# Patient Record
Sex: Female | Born: 1960
Health system: Southern US, Community
[De-identification: ages and names within clinical notes are randomized; demographics above are authoritative.]

## PROBLEM LIST (undated history)

## (undated) DIAGNOSIS — E119 Type 2 diabetes mellitus without complications: Secondary | ICD-10-CM

## (undated) DIAGNOSIS — F32A Depression, unspecified: Secondary | ICD-10-CM

## (undated) DIAGNOSIS — J189 Pneumonia, unspecified organism: Secondary | ICD-10-CM

## (undated) DIAGNOSIS — R918 Other nonspecific abnormal finding of lung field: Secondary | ICD-10-CM

## (undated) DIAGNOSIS — K219 Gastro-esophageal reflux disease without esophagitis: Secondary | ICD-10-CM

## (undated) DIAGNOSIS — R51 Headache: Secondary | ICD-10-CM

## (undated) DIAGNOSIS — N393 Stress incontinence (female) (male): Secondary | ICD-10-CM

## (undated) DIAGNOSIS — T4145XA Adverse effect of unspecified anesthetic, initial encounter: Secondary | ICD-10-CM

## (undated) DIAGNOSIS — K529 Noninfective gastroenteritis and colitis, unspecified: Secondary | ICD-10-CM

## (undated) DIAGNOSIS — F419 Anxiety disorder, unspecified: Secondary | ICD-10-CM

## (undated) DIAGNOSIS — C801 Malignant (primary) neoplasm, unspecified: Secondary | ICD-10-CM

## (undated) DIAGNOSIS — Z9889 Other specified postprocedural states: Secondary | ICD-10-CM

## (undated) DIAGNOSIS — R112 Nausea with vomiting, unspecified: Secondary | ICD-10-CM

## (undated) DIAGNOSIS — T8859XA Other complications of anesthesia, initial encounter: Secondary | ICD-10-CM

## (undated) DIAGNOSIS — F329 Major depressive disorder, single episode, unspecified: Secondary | ICD-10-CM

## (undated) DIAGNOSIS — K589 Irritable bowel syndrome without diarrhea: Secondary | ICD-10-CM

## (undated) DIAGNOSIS — L309 Dermatitis, unspecified: Secondary | ICD-10-CM

## (undated) DIAGNOSIS — K635 Polyp of colon: Secondary | ICD-10-CM

## (undated) DIAGNOSIS — J40 Bronchitis, not specified as acute or chronic: Secondary | ICD-10-CM

## (undated) DIAGNOSIS — R0602 Shortness of breath: Secondary | ICD-10-CM

## (undated) HISTORY — DX: Major depressive disorder, single episode, unspecified: F32.9

## (undated) HISTORY — DX: Depression, unspecified: F32.A

## (undated) HISTORY — PX: BIOPSY THYROID: PRO38

## (undated) HISTORY — DX: Type 2 diabetes mellitus without complications: E11.9

## (undated) HISTORY — PX: UTERINE FIBROID SURGERY: SHX826

---

## 1991-08-28 HISTORY — PX: OTHER SURGICAL HISTORY: SHX169

## 1998-07-11 ENCOUNTER — Other Ambulatory Visit: Admission: RE | Admit: 1998-07-11 | Discharge: 1998-07-11 | Payer: Self-pay | Admitting: *Deleted

## 1998-07-21 ENCOUNTER — Inpatient Hospital Stay (HOSPITAL_COMMUNITY): Admission: AD | Admit: 1998-07-21 | Discharge: 1998-07-21 | Payer: Self-pay | Admitting: Obstetrics & Gynecology

## 1998-07-21 ENCOUNTER — Encounter: Payer: Self-pay | Admitting: Obstetrics & Gynecology

## 1998-07-27 ENCOUNTER — Other Ambulatory Visit: Admission: RE | Admit: 1998-07-27 | Discharge: 1998-07-27 | Payer: Self-pay | Admitting: Obstetrics and Gynecology

## 1998-11-03 ENCOUNTER — Ambulatory Visit (HOSPITAL_COMMUNITY): Admission: RE | Admit: 1998-11-03 | Discharge: 1998-11-03 | Payer: Self-pay | Admitting: Obstetrics and Gynecology

## 1999-02-01 ENCOUNTER — Other Ambulatory Visit: Admission: RE | Admit: 1999-02-01 | Discharge: 1999-02-01 | Payer: Self-pay | Admitting: Obstetrics and Gynecology

## 1999-03-30 ENCOUNTER — Inpatient Hospital Stay (HOSPITAL_COMMUNITY): Admission: AD | Admit: 1999-03-30 | Discharge: 1999-03-30 | Payer: Self-pay | Admitting: Obstetrics and Gynecology

## 1999-03-30 ENCOUNTER — Encounter: Payer: Self-pay | Admitting: Obstetrics & Gynecology

## 1999-04-27 ENCOUNTER — Encounter: Admission: RE | Admit: 1999-04-27 | Discharge: 1999-07-26 | Payer: Self-pay | Admitting: Obstetrics and Gynecology

## 1999-08-04 ENCOUNTER — Inpatient Hospital Stay (HOSPITAL_COMMUNITY): Admission: AD | Admit: 1999-08-04 | Discharge: 1999-08-04 | Payer: Self-pay | Admitting: Obstetrics & Gynecology

## 1999-08-22 ENCOUNTER — Ambulatory Visit (HOSPITAL_COMMUNITY): Admission: RE | Admit: 1999-08-22 | Discharge: 1999-08-22 | Payer: Self-pay | Admitting: Obstetrics and Gynecology

## 1999-08-23 ENCOUNTER — Inpatient Hospital Stay (HOSPITAL_COMMUNITY): Admission: AD | Admit: 1999-08-23 | Discharge: 1999-08-27 | Payer: Self-pay | Admitting: Obstetrics and Gynecology

## 1999-08-28 ENCOUNTER — Encounter: Admission: RE | Admit: 1999-08-28 | Discharge: 1999-10-31 | Payer: Self-pay | Admitting: Obstetrics and Gynecology

## 1999-09-28 ENCOUNTER — Other Ambulatory Visit: Admission: RE | Admit: 1999-09-28 | Discharge: 1999-09-28 | Payer: Self-pay | Admitting: Obstetrics and Gynecology

## 2000-07-10 ENCOUNTER — Ambulatory Visit (HOSPITAL_COMMUNITY): Admission: RE | Admit: 2000-07-10 | Discharge: 2000-07-10 | Payer: Self-pay | Admitting: Obstetrics and Gynecology

## 2000-07-10 ENCOUNTER — Encounter (INDEPENDENT_AMBULATORY_CARE_PROVIDER_SITE_OTHER): Payer: Self-pay | Admitting: Specialist

## 2000-11-26 ENCOUNTER — Other Ambulatory Visit: Admission: RE | Admit: 2000-11-26 | Discharge: 2000-11-26 | Payer: Self-pay | Admitting: Obstetrics and Gynecology

## 2000-12-18 ENCOUNTER — Ambulatory Visit (HOSPITAL_COMMUNITY): Admission: RE | Admit: 2000-12-18 | Discharge: 2000-12-18 | Payer: Self-pay | Admitting: Gastroenterology

## 2000-12-18 ENCOUNTER — Encounter (INDEPENDENT_AMBULATORY_CARE_PROVIDER_SITE_OTHER): Payer: Self-pay | Admitting: Specialist

## 2001-05-07 ENCOUNTER — Ambulatory Visit (HOSPITAL_COMMUNITY): Admission: RE | Admit: 2001-05-07 | Discharge: 2001-05-07 | Payer: Self-pay | Admitting: Internal Medicine

## 2001-05-16 ENCOUNTER — Ambulatory Visit (HOSPITAL_BASED_OUTPATIENT_CLINIC_OR_DEPARTMENT_OTHER): Admission: RE | Admit: 2001-05-16 | Discharge: 2001-05-16 | Payer: Self-pay | Admitting: Internal Medicine

## 2001-08-25 ENCOUNTER — Encounter (INDEPENDENT_AMBULATORY_CARE_PROVIDER_SITE_OTHER): Payer: Self-pay | Admitting: *Deleted

## 2001-08-25 ENCOUNTER — Ambulatory Visit (HOSPITAL_COMMUNITY): Admission: RE | Admit: 2001-08-25 | Discharge: 2001-08-25 | Payer: Self-pay | Admitting: *Deleted

## 2001-12-02 ENCOUNTER — Other Ambulatory Visit: Admission: RE | Admit: 2001-12-02 | Discharge: 2001-12-02 | Payer: Self-pay | Admitting: Obstetrics and Gynecology

## 2002-10-22 ENCOUNTER — Other Ambulatory Visit: Admission: RE | Admit: 2002-10-22 | Discharge: 2002-10-22 | Payer: Self-pay | Admitting: Obstetrics and Gynecology

## 2003-03-19 ENCOUNTER — Encounter: Payer: Self-pay | Admitting: Emergency Medicine

## 2003-03-19 ENCOUNTER — Encounter: Payer: Self-pay | Admitting: *Deleted

## 2003-03-19 ENCOUNTER — Emergency Department (HOSPITAL_COMMUNITY): Admission: EM | Admit: 2003-03-19 | Discharge: 2003-03-19 | Payer: Self-pay | Admitting: Emergency Medicine

## 2003-07-13 ENCOUNTER — Encounter: Admission: RE | Admit: 2003-07-13 | Discharge: 2003-07-13 | Payer: Self-pay | Admitting: Gastroenterology

## 2003-10-28 ENCOUNTER — Other Ambulatory Visit: Admission: RE | Admit: 2003-10-28 | Discharge: 2003-10-28 | Payer: Self-pay | Admitting: Obstetrics and Gynecology

## 2004-06-07 ENCOUNTER — Ambulatory Visit: Payer: Self-pay | Admitting: Family Medicine

## 2004-06-09 ENCOUNTER — Encounter: Admission: RE | Admit: 2004-06-09 | Discharge: 2004-06-09 | Payer: Self-pay | Admitting: Family Medicine

## 2004-07-26 ENCOUNTER — Ambulatory Visit: Payer: Self-pay | Admitting: Internal Medicine

## 2004-08-25 ENCOUNTER — Ambulatory Visit: Payer: Self-pay | Admitting: Family Medicine

## 2004-11-10 ENCOUNTER — Other Ambulatory Visit: Admission: RE | Admit: 2004-11-10 | Discharge: 2004-11-10 | Payer: Self-pay | Admitting: Obstetrics and Gynecology

## 2004-12-15 ENCOUNTER — Ambulatory Visit: Payer: Self-pay | Admitting: Family Medicine

## 2005-01-19 ENCOUNTER — Ambulatory Visit: Payer: Self-pay | Admitting: Internal Medicine

## 2005-02-02 ENCOUNTER — Encounter: Admission: RE | Admit: 2005-02-02 | Discharge: 2005-05-03 | Payer: Self-pay | Admitting: Internal Medicine

## 2005-03-25 ENCOUNTER — Emergency Department (HOSPITAL_COMMUNITY): Admission: EM | Admit: 2005-03-25 | Discharge: 2005-03-25 | Payer: Self-pay | Admitting: Family Medicine

## 2005-03-27 ENCOUNTER — Ambulatory Visit: Payer: Self-pay | Admitting: Family Medicine

## 2005-06-04 ENCOUNTER — Ambulatory Visit: Payer: Self-pay | Admitting: Family Medicine

## 2005-06-12 ENCOUNTER — Ambulatory Visit: Payer: Self-pay

## 2005-11-02 ENCOUNTER — Ambulatory Visit: Payer: Self-pay | Admitting: Family Medicine

## 2005-11-15 ENCOUNTER — Other Ambulatory Visit: Admission: RE | Admit: 2005-11-15 | Discharge: 2005-11-15 | Payer: Self-pay | Admitting: Obstetrics and Gynecology

## 2005-12-19 ENCOUNTER — Ambulatory Visit: Payer: Self-pay | Admitting: Internal Medicine

## 2005-12-20 ENCOUNTER — Ambulatory Visit: Payer: Self-pay | Admitting: Internal Medicine

## 2005-12-24 ENCOUNTER — Ambulatory Visit: Payer: Self-pay

## 2005-12-28 ENCOUNTER — Ambulatory Visit: Payer: Self-pay | Admitting: Family Medicine

## 2006-01-04 ENCOUNTER — Ambulatory Visit: Payer: Self-pay | Admitting: Gastroenterology

## 2006-01-31 ENCOUNTER — Ambulatory Visit: Payer: Self-pay | Admitting: Family Medicine

## 2006-03-26 ENCOUNTER — Ambulatory Visit: Payer: Self-pay | Admitting: Family Medicine

## 2006-05-29 ENCOUNTER — Ambulatory Visit: Payer: Self-pay | Admitting: Family Medicine

## 2006-05-31 ENCOUNTER — Ambulatory Visit: Payer: Self-pay | Admitting: Family Medicine

## 2006-06-05 ENCOUNTER — Ambulatory Visit: Payer: Self-pay | Admitting: Cardiology

## 2006-06-14 ENCOUNTER — Ambulatory Visit (HOSPITAL_COMMUNITY): Admission: RE | Admit: 2006-06-14 | Discharge: 2006-06-14 | Payer: Self-pay | Admitting: Gastroenterology

## 2006-06-21 ENCOUNTER — Encounter: Admission: RE | Admit: 2006-06-21 | Discharge: 2006-06-21 | Payer: Self-pay | Admitting: Family Medicine

## 2006-07-12 ENCOUNTER — Encounter: Admission: RE | Admit: 2006-07-12 | Discharge: 2006-07-12 | Payer: Self-pay | Admitting: Obstetrics and Gynecology

## 2006-09-13 ENCOUNTER — Ambulatory Visit: Payer: Self-pay | Admitting: Family Medicine

## 2006-09-13 LAB — CONVERTED CEMR LAB
ALT: 36 units/L — ABNORMAL HIGH (ref 0–35)
AST: 28 units/L (ref 0–37)
Albumin: 4.6 g/dL (ref 3.5–5.2)
Alkaline Phosphatase: 93 units/L (ref 39–117)
Calcium: 9.6 mg/dL (ref 8.4–10.5)
Chloride: 101 meq/L (ref 96–112)
Potassium: 4 meq/L (ref 3.5–5.3)
Sodium: 140 meq/L (ref 135–145)
Total Protein: 7.5 g/dL (ref 6.0–8.3)

## 2006-10-30 ENCOUNTER — Ambulatory Visit: Payer: Self-pay | Admitting: *Deleted

## 2007-01-01 ENCOUNTER — Encounter: Payer: Self-pay | Admitting: Family Medicine

## 2007-01-02 ENCOUNTER — Ambulatory Visit: Payer: Self-pay | Admitting: Cardiology

## 2007-01-06 DIAGNOSIS — G43519 Persistent migraine aura without cerebral infarction, intractable, without status migrainosus: Secondary | ICD-10-CM

## 2007-01-06 DIAGNOSIS — K7689 Other specified diseases of liver: Secondary | ICD-10-CM

## 2007-01-06 DIAGNOSIS — G253 Myoclonus: Secondary | ICD-10-CM

## 2007-01-06 DIAGNOSIS — G43019 Migraine without aura, intractable, without status migrainosus: Secondary | ICD-10-CM | POA: Insufficient documentation

## 2007-01-06 DIAGNOSIS — O99810 Abnormal glucose complicating pregnancy: Secondary | ICD-10-CM

## 2007-01-06 DIAGNOSIS — Z87891 Personal history of nicotine dependence: Secondary | ICD-10-CM

## 2007-01-06 DIAGNOSIS — E1142 Type 2 diabetes mellitus with diabetic polyneuropathy: Secondary | ICD-10-CM

## 2007-01-06 DIAGNOSIS — E1165 Type 2 diabetes mellitus with hyperglycemia: Secondary | ICD-10-CM

## 2007-01-06 HISTORY — DX: Type 2 diabetes mellitus with diabetic polyneuropathy: E11.42

## 2007-01-06 HISTORY — DX: Persistent migraine aura without cerebral infarction, intractable, without status migrainosus: G43.519

## 2007-01-06 HISTORY — DX: Abnormal glucose complicating pregnancy: O99.810

## 2007-01-06 HISTORY — DX: Myoclonus: G25.3

## 2007-01-06 HISTORY — DX: Other specified diseases of liver: K76.89

## 2007-01-06 HISTORY — DX: Personal history of nicotine dependence: Z87.891

## 2007-01-07 ENCOUNTER — Ambulatory Visit: Payer: Self-pay

## 2007-04-11 ENCOUNTER — Telehealth (INDEPENDENT_AMBULATORY_CARE_PROVIDER_SITE_OTHER): Payer: Self-pay | Admitting: *Deleted

## 2007-06-20 ENCOUNTER — Ambulatory Visit: Payer: Self-pay | Admitting: Cardiovascular Disease

## 2007-06-27 ENCOUNTER — Telehealth (INDEPENDENT_AMBULATORY_CARE_PROVIDER_SITE_OTHER): Payer: Self-pay | Admitting: *Deleted

## 2007-07-04 ENCOUNTER — Ambulatory Visit: Payer: Self-pay | Admitting: Family Medicine

## 2007-07-04 DIAGNOSIS — F418 Other specified anxiety disorders: Secondary | ICD-10-CM

## 2007-07-04 HISTORY — DX: Other specified anxiety disorders: F41.8

## 2007-07-10 ENCOUNTER — Telehealth: Payer: Self-pay | Admitting: Family Medicine

## 2007-09-24 ENCOUNTER — Telehealth: Payer: Self-pay | Admitting: Family Medicine

## 2007-11-03 ENCOUNTER — Telehealth (INDEPENDENT_AMBULATORY_CARE_PROVIDER_SITE_OTHER): Payer: Self-pay | Admitting: *Deleted

## 2007-11-14 ENCOUNTER — Ambulatory Visit: Payer: Self-pay | Admitting: Family Medicine

## 2007-11-14 DIAGNOSIS — M25559 Pain in unspecified hip: Secondary | ICD-10-CM

## 2007-11-14 DIAGNOSIS — R5381 Other malaise: Secondary | ICD-10-CM

## 2007-11-14 DIAGNOSIS — IMO0001 Reserved for inherently not codable concepts without codable children: Secondary | ICD-10-CM

## 2007-11-14 DIAGNOSIS — I8390 Asymptomatic varicose veins of unspecified lower extremity: Secondary | ICD-10-CM | POA: Insufficient documentation

## 2007-11-14 DIAGNOSIS — R5383 Other fatigue: Secondary | ICD-10-CM

## 2007-11-14 DIAGNOSIS — M791 Myalgia, unspecified site: Secondary | ICD-10-CM

## 2007-11-14 HISTORY — DX: Pain in unspecified hip: M25.559

## 2007-11-14 HISTORY — DX: Myalgia, unspecified site: M79.10

## 2007-11-14 HISTORY — DX: Other fatigue: R53.83

## 2007-11-14 HISTORY — DX: Asymptomatic varicose veins of unspecified lower extremity: I83.90

## 2007-11-17 ENCOUNTER — Telehealth: Payer: Self-pay | Admitting: Family Medicine

## 2007-11-20 LAB — CONVERTED CEMR LAB
Anti Nuclear Antibody(ANA): NEGATIVE
Rhuematoid fact SerPl-aCnc: 25 intl units/mL — ABNORMAL HIGH (ref 0–20)

## 2007-11-21 ENCOUNTER — Telehealth (INDEPENDENT_AMBULATORY_CARE_PROVIDER_SITE_OTHER): Payer: Self-pay | Admitting: *Deleted

## 2007-11-23 LAB — CONVERTED CEMR LAB
Basophils Relative: 1.4 % — ABNORMAL HIGH (ref 0.0–1.0)
Chloride: 101 meq/L (ref 96–112)
Creatinine, Ser: 0.6 mg/dL (ref 0.4–1.2)
Eosinophils Absolute: 0.1 10*3/uL (ref 0.0–0.6)
Folate: 9.6 ng/mL
GFR calc non Af Amer: 114 mL/min
Glucose, Bld: 89 mg/dL (ref 70–99)
Hemoglobin: 12.7 g/dL (ref 12.0–15.0)
Lymphocytes Relative: 33.3 % (ref 12.0–46.0)
MCV: 85.1 fL (ref 78.0–100.0)
Monocytes Absolute: 0.2 10*3/uL (ref 0.2–0.7)
Monocytes Relative: 6.3 % (ref 3.0–11.0)
Neutro Abs: 2.2 10*3/uL (ref 1.4–7.7)
Platelets: 154 10*3/uL (ref 150–400)
Potassium: 3.6 meq/L (ref 3.5–5.1)
Sed Rate: 32 mm/hr — ABNORMAL HIGH (ref 0–25)
Sodium: 139 meq/L (ref 135–145)
Vitamin B-12: 568 pg/mL (ref 211–911)

## 2007-11-24 ENCOUNTER — Telehealth (INDEPENDENT_AMBULATORY_CARE_PROVIDER_SITE_OTHER): Payer: Self-pay | Admitting: *Deleted

## 2007-11-24 ENCOUNTER — Encounter (INDEPENDENT_AMBULATORY_CARE_PROVIDER_SITE_OTHER): Payer: Self-pay | Admitting: *Deleted

## 2007-11-25 ENCOUNTER — Telehealth (INDEPENDENT_AMBULATORY_CARE_PROVIDER_SITE_OTHER): Payer: Self-pay | Admitting: *Deleted

## 2008-03-05 ENCOUNTER — Ambulatory Visit: Payer: Self-pay | Admitting: Family Medicine

## 2008-03-09 ENCOUNTER — Encounter (INDEPENDENT_AMBULATORY_CARE_PROVIDER_SITE_OTHER): Payer: Self-pay | Admitting: *Deleted

## 2008-03-09 LAB — CONVERTED CEMR LAB: Anti Nuclear Antibody(ANA): NEGATIVE

## 2008-03-10 ENCOUNTER — Telehealth (INDEPENDENT_AMBULATORY_CARE_PROVIDER_SITE_OTHER): Payer: Self-pay | Admitting: *Deleted

## 2008-03-15 ENCOUNTER — Encounter: Payer: Self-pay | Admitting: Family Medicine

## 2008-03-15 ENCOUNTER — Encounter: Admission: RE | Admit: 2008-03-15 | Discharge: 2008-05-18 | Payer: Self-pay | Admitting: Family Medicine

## 2008-03-19 ENCOUNTER — Encounter (INDEPENDENT_AMBULATORY_CARE_PROVIDER_SITE_OTHER): Payer: Self-pay | Admitting: *Deleted

## 2008-03-19 LAB — CONVERTED CEMR LAB
BUN: 15 mg/dL (ref 6–23)
Basophils Relative: 0.3 % (ref 0.0–1.0)
CO2: 30 meq/L (ref 19–32)
Chloride: 104 meq/L (ref 96–112)
Creatinine, Ser: 0.7 mg/dL (ref 0.4–1.2)
Eosinophils Relative: 3.9 % (ref 0.0–5.0)
GFR calc non Af Amer: 95 mL/min
Glucose, Bld: 166 mg/dL — ABNORMAL HIGH (ref 70–99)
HCT: 37.8 % (ref 36.0–46.0)
Hemoglobin: 13.1 g/dL (ref 12.0–15.0)
Lymphocytes Relative: 38.2 % (ref 12.0–46.0)
Monocytes Absolute: 0.3 10*3/uL (ref 0.1–1.0)
Monocytes Relative: 7 % (ref 3.0–12.0)
Neutro Abs: 1.8 10*3/uL (ref 1.4–7.7)
Potassium: 3.9 meq/L (ref 3.5–5.1)
RBC: 4.44 M/uL (ref 3.87–5.11)
Rhuematoid fact SerPl-aCnc: <20 intl units/mL — ABNORMAL LOW (ref 0.0–20.0)
T3, Free: 3.3 pg/mL (ref 2.3–4.2)
TSH: 0.56 microintl units/mL (ref 0.35–5.50)

## 2008-04-29 ENCOUNTER — Ambulatory Visit: Payer: Self-pay | Admitting: Family Medicine

## 2008-04-29 DIAGNOSIS — J209 Acute bronchitis, unspecified: Secondary | ICD-10-CM

## 2008-04-29 HISTORY — DX: Acute bronchitis, unspecified: J20.9

## 2008-05-05 ENCOUNTER — Telehealth (INDEPENDENT_AMBULATORY_CARE_PROVIDER_SITE_OTHER): Payer: Self-pay | Admitting: *Deleted

## 2008-05-05 DIAGNOSIS — B373 Candidiasis of vulva and vagina: Secondary | ICD-10-CM

## 2008-05-05 DIAGNOSIS — B3731 Acute candidiasis of vulva and vagina: Secondary | ICD-10-CM

## 2008-05-05 HISTORY — DX: Acute candidiasis of vulva and vagina: B37.31

## 2008-05-05 HISTORY — DX: Candidiasis of vulva and vagina: B37.3

## 2008-06-14 ENCOUNTER — Encounter: Payer: Self-pay | Admitting: Family Medicine

## 2008-06-24 ENCOUNTER — Encounter: Payer: Self-pay | Admitting: Family Medicine

## 2008-06-28 ENCOUNTER — Encounter: Payer: Self-pay | Admitting: Family Medicine

## 2008-06-29 ENCOUNTER — Ambulatory Visit: Payer: Self-pay | Admitting: Family Medicine

## 2008-06-29 ENCOUNTER — Telehealth (INDEPENDENT_AMBULATORY_CARE_PROVIDER_SITE_OTHER): Payer: Self-pay | Admitting: *Deleted

## 2008-06-29 DIAGNOSIS — L255 Unspecified contact dermatitis due to plants, except food: Secondary | ICD-10-CM | POA: Insufficient documentation

## 2008-06-29 DIAGNOSIS — I10 Essential (primary) hypertension: Secondary | ICD-10-CM | POA: Insufficient documentation

## 2008-06-29 HISTORY — DX: Unspecified contact dermatitis due to plants, except food: L25.5

## 2008-07-07 ENCOUNTER — Telehealth (INDEPENDENT_AMBULATORY_CARE_PROVIDER_SITE_OTHER): Payer: Self-pay | Admitting: *Deleted

## 2008-10-15 ENCOUNTER — Telehealth (INDEPENDENT_AMBULATORY_CARE_PROVIDER_SITE_OTHER): Payer: Self-pay | Admitting: *Deleted

## 2008-10-19 ENCOUNTER — Telehealth (INDEPENDENT_AMBULATORY_CARE_PROVIDER_SITE_OTHER): Payer: Self-pay | Admitting: *Deleted

## 2008-11-17 ENCOUNTER — Encounter: Admission: RE | Admit: 2008-11-17 | Discharge: 2008-11-17 | Payer: Self-pay | Admitting: Internal Medicine

## 2008-11-17 ENCOUNTER — Encounter: Admission: RE | Admit: 2008-11-17 | Discharge: 2008-11-17 | Payer: Self-pay | Admitting: Gastroenterology

## 2008-11-24 ENCOUNTER — Encounter: Admission: RE | Admit: 2008-11-24 | Discharge: 2008-11-24 | Payer: Self-pay | Admitting: Gastroenterology

## 2008-12-24 ENCOUNTER — Encounter: Payer: Self-pay | Admitting: Family Medicine

## 2009-01-12 ENCOUNTER — Ambulatory Visit: Payer: Self-pay | Admitting: Family Medicine

## 2009-01-13 ENCOUNTER — Telehealth (INDEPENDENT_AMBULATORY_CARE_PROVIDER_SITE_OTHER): Payer: Self-pay | Admitting: *Deleted

## 2009-03-02 ENCOUNTER — Telehealth (INDEPENDENT_AMBULATORY_CARE_PROVIDER_SITE_OTHER): Payer: Self-pay | Admitting: *Deleted

## 2009-04-08 ENCOUNTER — Encounter: Admission: RE | Admit: 2009-04-08 | Discharge: 2009-04-08 | Payer: Self-pay | Admitting: Gastroenterology

## 2009-04-14 ENCOUNTER — Telehealth (INDEPENDENT_AMBULATORY_CARE_PROVIDER_SITE_OTHER): Payer: Self-pay | Admitting: *Deleted

## 2009-04-18 ENCOUNTER — Encounter: Payer: Self-pay | Admitting: Family Medicine

## 2009-05-23 ENCOUNTER — Ambulatory Visit: Payer: Self-pay | Admitting: Family Medicine

## 2009-05-23 ENCOUNTER — Telehealth (INDEPENDENT_AMBULATORY_CARE_PROVIDER_SITE_OTHER): Payer: Self-pay | Admitting: *Deleted

## 2009-05-23 ENCOUNTER — Telehealth: Payer: Self-pay | Admitting: Family Medicine

## 2009-05-23 DIAGNOSIS — J019 Acute sinusitis, unspecified: Secondary | ICD-10-CM

## 2009-05-23 HISTORY — DX: Acute sinusitis, unspecified: J01.90

## 2009-06-07 ENCOUNTER — Telehealth (INDEPENDENT_AMBULATORY_CARE_PROVIDER_SITE_OTHER): Payer: Self-pay | Admitting: *Deleted

## 2009-06-08 ENCOUNTER — Telehealth (INDEPENDENT_AMBULATORY_CARE_PROVIDER_SITE_OTHER): Payer: Self-pay | Admitting: *Deleted

## 2009-07-13 ENCOUNTER — Telehealth: Payer: Self-pay | Admitting: Family Medicine

## 2009-07-15 ENCOUNTER — Encounter: Admission: RE | Admit: 2009-07-15 | Discharge: 2009-07-15 | Payer: Self-pay | Admitting: Orthopedic Surgery

## 2009-07-29 ENCOUNTER — Encounter: Payer: Self-pay | Admitting: Family Medicine

## 2009-08-09 ENCOUNTER — Encounter: Admission: RE | Admit: 2009-08-09 | Discharge: 2009-08-25 | Payer: Self-pay | Admitting: Orthopedic Surgery

## 2009-08-25 ENCOUNTER — Encounter: Admission: RE | Admit: 2009-08-25 | Discharge: 2009-09-27 | Payer: Self-pay | Admitting: Orthopedic Surgery

## 2009-09-06 ENCOUNTER — Encounter: Payer: Self-pay | Admitting: Family Medicine

## 2009-09-08 ENCOUNTER — Ambulatory Visit: Payer: Self-pay | Admitting: Family Medicine

## 2009-09-08 DIAGNOSIS — R05 Cough: Secondary | ICD-10-CM

## 2009-09-08 DIAGNOSIS — J9801 Acute bronchospasm: Secondary | ICD-10-CM

## 2009-09-08 DIAGNOSIS — R059 Cough, unspecified: Secondary | ICD-10-CM

## 2009-09-08 HISTORY — DX: Cough, unspecified: R05.9

## 2009-09-08 HISTORY — DX: Acute bronchospasm: J98.01

## 2009-09-09 ENCOUNTER — Encounter (INDEPENDENT_AMBULATORY_CARE_PROVIDER_SITE_OTHER): Payer: Self-pay | Admitting: *Deleted

## 2009-09-16 ENCOUNTER — Encounter: Admission: RE | Admit: 2009-09-16 | Discharge: 2009-09-16 | Payer: Self-pay | Admitting: Obstetrics and Gynecology

## 2009-09-19 ENCOUNTER — Telehealth (INDEPENDENT_AMBULATORY_CARE_PROVIDER_SITE_OTHER): Payer: Self-pay | Admitting: *Deleted

## 2009-09-19 ENCOUNTER — Encounter: Payer: Self-pay | Admitting: Family Medicine

## 2009-09-20 ENCOUNTER — Ambulatory Visit: Payer: Self-pay | Admitting: Family Medicine

## 2009-09-23 ENCOUNTER — Ambulatory Visit: Payer: Self-pay | Admitting: Family Medicine

## 2009-09-23 ENCOUNTER — Ambulatory Visit: Payer: Self-pay | Admitting: Internal Medicine

## 2009-09-27 ENCOUNTER — Encounter: Payer: Self-pay | Admitting: Family Medicine

## 2009-11-07 ENCOUNTER — Encounter: Admission: RE | Admit: 2009-11-07 | Discharge: 2009-11-07 | Payer: Self-pay

## 2009-12-29 ENCOUNTER — Encounter: Payer: Self-pay | Admitting: Family Medicine

## 2010-01-06 ENCOUNTER — Encounter: Admission: RE | Admit: 2010-01-06 | Discharge: 2010-01-06 | Payer: Self-pay | Admitting: Gastroenterology

## 2010-06-02 ENCOUNTER — Telehealth (INDEPENDENT_AMBULATORY_CARE_PROVIDER_SITE_OTHER): Payer: Self-pay | Admitting: *Deleted

## 2010-09-28 NOTE — Letter (Signed)
Summary: Medical Exemption Form/[facility]  Medical Exemption Form/[place] [facility]   Imported By: Edmonia James 09/13/2009 09:22:11  _____________________________________________________________________  External Attachment:    Type:   Image     Comment:   External Document

## 2010-09-28 NOTE — Assessment & Plan Note (Signed)
Summary: cough/kdc   Vital Signs:  Patient profile:   50 year old female Weight:      269 pounds Temp:     98.2 degrees F oral Pulse rate:   82 / minute Pulse rhythm:   regular BP sitting:   126 / 82  (left arm) Cuff size:   large  Vitals Entered By: Allyn Kenner CMA (September 08, 2009 2:57 PM) CC: Pt c/o cough x 1 month. At night she tends to loose her voice. , Cough   History of Present Illness:  Cough      This is a 50 year old woman who presents with Cough.  The symptoms began 1 month ago.  The patient reports productive cough and wheezing, but denies non-productive cough, pleuritic chest pain, shortness of breath, exertional dyspnea, fever, hemoptysis, and malaise.  The patient denies the following symptoms: cold/URI symptoms, sore throat, nasal congestion, chronic rhinitis, weight loss, acid reflux symptoms, and peripheral edema.  The cough is worse with activity.  Ineffective prior treatments have included OTC cough medication.   Pt used her sons neb and felt better after using it.    Current Medications (verified): 1)  Novolog Flexpen 100 Unit/ml Soln (Insulin Aspart) .... Inject 5 Unit Subcutaneously Three Times A Day-Needs Ov and Labs 2)  Onetouch Ultra Test   Strp (Glucose Blood) .... Use As Directed 3)  Novofine 30g X 8 Mm Misc (Insulin Pen Needle) .... Use As Directed 4)  Byetta 5 Mcg Pen 5 Mcg/0.25m  Soln (Exenatide) .... 2 Injections Daily-Needs Labs and Ov 5)  Ativan 0.5 Mg  Tabs (Lorazepam) ..Marland Kitchen. 1 By Mouth Three Times A Day As Needed 6)  Wellbutrin Xl 150 Mg  Tb24 (Bupropion Hcl) ..Marland Kitchen. 1 By Mouth Qam** Brand Name Medically Necessary** 7)  Symlin 600 Mcg/ml Soln (Pramlintide Acetate) .... Three Times Daily 8)  Levemir Flexpen 100 Unit/ml Soln (Insulin Detemir) .... 40-45 Units Daily. 9)  Prodigy No Coding Blood Gluc  Strp (Glucose Blood) .... Test Once Daily 10)  Protonix 40 Mg Tbec (Pantoprazole Sodium) ..Marland Kitchen. 1 By Mouth Once Daily 11)  Betamethasone Dipropionate  0.05 % Crea (Betamethasone Dipropionate) .... Apply Twice A Day If Needed 12)  Nasacort Aq 55 Mcg/act Aers (Triamcinolone Acetonide(Nasal)) .... 2 Sprays Each Nostril Once Daily 13)  Biaxin Xl 500 Mg Xr24h-Tab (Clarithromycin) .... 2 By Mouth Every Day For 14 Days. 14)  Proair Hfa 108 (90 Base) Mcg/act Aers (Albuterol Sulfate) .... 2 Puffs Qid As Needed 15)  Symbicort 80-4.5 Mcg/act Aero (Budesonide-Formoterol Fumarate) .... 2 Puffs Two Times A Day  Allergies: 1)  ! Codeine 2)  ! Prednisone 3)  ! Reglan 4)  Penicillin G Potassium (Penicillin G Potassium)  Past History:  Past medical, surgical, family and social histories (including risk factors) reviewed for relevance to current acute and chronic problems.  Past Medical History: Reviewed history from 06/29/2008 and no changes required. Diabetes mellitus, type II Depression Hypertension  Past Surgical History: Reviewed history from 01/06/2007 and no changes required. Caesarean section  Family History: Reviewed history and no changes required.  Social History: Reviewed history from 03/05/2008 and no changes required. Divorced Alcohol use-no Drug use-no Regular exercise-no  Review of Systems      See HPI  Physical Exam  General:  Well-developed,well-nourished,in no acute distress; alert,appropriate and cooperative throughout examination Ears:  External ear exam shows no significant lesions or deformities.  Otoscopic examination reveals clear canals, tympanic membranes are intact bilaterally without bulging, retraction, inflammation or discharge.  Hearing is grossly normal bilaterally. Nose:  External nasal examination shows no deformity or inflammation. Nasal mucosa are pink and moist without lesions or exudates. Mouth:  Oral mucosa and oropharynx without lesions or exudates.  Teeth in good repair. Neck:  No deformities, masses, or tenderness noted. Lungs:  Normal respiratory effort, chest expands symmetrically. Lungs are  clear to auscultation, no crackles or wheezes. Heart:  Normal rate and regular rhythm. S1 and S2 normal without gallop, murmur, click, rub or other extra sounds. Skin:  Intact without suspicious lesions or rashes Psych:  Oriented X3 and normally interactive.     Impression & Recommendations:  Problem # 1:  COUGH (ICD-786.2)  Orders: T-2 View CXR ([id number]) Pulmonary Referral (Pulmonary)  Problem # 2:  ACUTE BRONCHOSPASM (ICD-519.11) check PFTs symbicort 80---- 2 puffs two times a day  proair  Orders: Pulmonary Referral (Pulmonary)  Complete Medication List: 1)  Novolog Flexpen 100 Unit/ml Soln (Insulin aspart) .... Inject 5 unit subcutaneously three times a day-needs ov and labs 2)  Onetouch Ultra Test Strp (Glucose blood) .... Use as directed 3)  Novofine 30g X 8 Mm Misc (Insulin pen needle) .... Use as directed 4)  Byetta 5 Mcg Pen 5 Mcg/0.43m Soln (Exenatide) .... 2 injections daily-needs labs and ov 5)  Ativan 0.5 Mg Tabs (Lorazepam) ..Marland Kitchen. 1 by mouth three times a day as needed 6)  Wellbutrin Xl 150 Mg Tb24 (Bupropion hcl) ..Marland Kitchen. 1 by mouth qam** brand name medically necessary** 7)  Symlin 600 Mcg/ml Soln (Pramlintide acetate) .... Three times daily 8)  Levemir Flexpen 100 Unit/ml Soln (Insulin detemir) .... 40-45 units daily. 9)  Prodigy No Coding Blood Gluc Strp (Glucose blood) .... Test once daily 10)  Protonix 40 Mg Tbec (Pantoprazole sodium) ..Marland Kitchen. 1 by mouth once daily 11)  Betamethasone Dipropionate 0.05 % Crea (Betamethasone dipropionate) .... Apply twice a day if needed 12)  Nasacort Aq 55 Mcg/act Aers (Triamcinolone acetonide(nasal)) .... 2 sprays each nostril once daily 13)  Biaxin Xl 500 Mg Xr24h-tab (Clarithromycin) .... 2 by mouth every day for 14 days. 14)  Proair Hfa 108 (90 Base) Mcg/act Aers (Albuterol sulfate) .... 2 puffs qid as needed 15)  Symbicort 80-4.5 Mcg/act Aero (Budesonide-formoterol fumarate) .... 2 puffs two times a day 16)  Zyrtec Allergy 10 Mg Tabs  (Cetirizine hcl) ..Marland Kitchen. 1 by mouth once daily Prescriptions: PROAIR HFA 108 (90 BASE) MCG/ACT AERS (ALBUTEROL SULFATE) 2 puffs qid as needed  #1 x 0   Entered and Authorized by:   YGarnet KoyanagiDO   Signed by:   YGarnet KoyanagiDO on 09/08/2009   Method used:   Electronically to        [id number] (retail)       [address] [place]  [id number]      Ph: [phone]      Fax: [phone]  RxID:   [id number]

## 2010-09-28 NOTE — Miscellaneous (Signed)
Summary: Orders Update pft charges  Clinical Lists Changes  Orders: Added new Service order of Carbon Monoxide diffusing w/capacity (94720) - Signed Added new Service order of Lung Volumes (94240) - Signed Added new Service order of Spirometry (Pre & Post) (94060) - Signed 

## 2010-09-28 NOTE — Letter (Signed)
Summary: Medical Exemption Request Form/[place] [facility]  Medical Exemption Request Form/[place] [facility]   Imported By: Edmonia James 09/26/2009 11:48:41  _____________________________________________________________________  External Attachment:    Type:   Image     Comment:   External Document

## 2010-09-28 NOTE — Progress Notes (Signed)
Summary: Prior Auth APPROVED [place] [facility]  Phone Note Refill Request Call back at [phone] Message from:  Pharmacy on June 02, 2010 10:44 AM  Refills Requested: Medication #1:  PROTONIX 40 MG TBEC 1 by mouth once daily   Dosage confirmed as above?Dosage Confirmed   Brand Name Necessary? No Prior Auth: [facility] on [address]  Initial call taken by: Elna Breslow,  June 02, 2010 10:44 AM  Follow-up for Phone Call        prior auth approved 06-06-10 until 06-06-11, pharmacy verbally informed.............Marland KitchenFelecia Deloach CMA  June 06, 2010 9:27 AM

## 2010-09-28 NOTE — Letter (Signed)
Summary: [facility]  [facility]   Imported By: Edmonia James 01/06/2010 10:08:13  _____________________________________________________________________  External Attachment:    Type:   Image     Comment:   External Document

## 2010-09-28 NOTE — Progress Notes (Signed)
Summary: PAPERWORK TO BE COMPLETED TODAY!!!!  Phone Note Call from Patient Call back at Home Phone [phone]   Caller: Patient Summary of Call: PATIENT DROPPED OFF PAPERWORK [facility] --MEDICAL EXEMPTION REQUEST  (FOR HER TO BE MOVED BACK FROM [place] [facility] TO REGULAR MEDICAID)   TOOK BACK TO [facility] [place] [place]  NEEDS TO BE COMPLETED TODAY!!!! Initial call taken by: Berneta Sages,  September 19, 2009 1:08 PM  Follow-up for Phone Call        form placed in enclose envelope and mail, left pt detail message form complete.............Marland KitchenFelecia Deloach CMA  September 20, 2009 8:25 AM

## 2010-09-28 NOTE — Letter (Signed)
Summary: Primary Care Consult Scheduled Letter  [facility] [place]   [place], [place] [address]   Phone: [phone]  Fax: [phone]      09/09/2009 MRN: [id number]  [facility] [address] [place], [place]  [address]    Dear Ms. Wickham,    We have scheduled an appointment for you.  At the recommendation of Dr. Garnet Koyanagi, we have scheduled you at [facility] for Pulmonary Function Test (PFTs) on 09-16-2009 at 12:00pm.  Their address is [address], [address], [place] [address]. The office phone number is [phone].  If this appointment day and time is not convenient for you, please feel free to call the office of the doctor you are being referred to at the number listed above and reschedule the appointment.    It is important for you to keep your scheduled appointments. We are here to make sure you are given good patient care.   Thank you,    Renee, Patient Care Coordinator [place] at [facility]

## 2010-10-12 ENCOUNTER — Other Ambulatory Visit: Payer: Self-pay | Admitting: Internal Medicine

## 2010-10-12 DIAGNOSIS — E049 Nontoxic goiter, unspecified: Secondary | ICD-10-CM

## 2010-10-27 ENCOUNTER — Other Ambulatory Visit: Payer: Self-pay | Admitting: Obstetrics and Gynecology

## 2010-10-27 DIAGNOSIS — Z1231 Encounter for screening mammogram for malignant neoplasm of breast: Secondary | ICD-10-CM

## 2010-11-03 ENCOUNTER — Ambulatory Visit
Admission: RE | Admit: 2010-11-03 | Discharge: 2010-11-03 | Disposition: A | Discharge status: Home / Self Care | Payer: Medicaid Other | Source: Ambulatory Visit | Attending: Obstetrics and Gynecology | Admitting: Obstetrics and Gynecology

## 2010-11-03 DIAGNOSIS — Z1231 Encounter for screening mammogram for malignant neoplasm of breast: Secondary | ICD-10-CM

## 2010-11-08 ENCOUNTER — Other Ambulatory Visit: Payer: Self-pay

## 2010-11-08 ENCOUNTER — Ambulatory Visit
Admission: RE | Admit: 2010-11-08 | Discharge: 2010-11-08 | Disposition: A | Discharge status: Home / Self Care | Payer: Medicaid Other | Source: Ambulatory Visit | Attending: Internal Medicine | Admitting: Internal Medicine

## 2010-11-08 DIAGNOSIS — E049 Nontoxic goiter, unspecified: Secondary | ICD-10-CM

## 2010-11-16 ENCOUNTER — Other Ambulatory Visit: Payer: Self-pay | Admitting: Family Medicine

## 2010-12-20 ENCOUNTER — Other Ambulatory Visit: Payer: Self-pay | Admitting: Family Medicine

## 2011-01-09 NOTE — Assessment & Plan Note (Signed)
[facility] OFFICE NOTE   JIHAN, MELLETTE                      MRN:          [id number]  DATE:01/02/2007                            DOB:          1960/09/11    I was asked by Dr. Kristie Cowman to evaluate Leslie Dales with chest  discomfort and arm discomfort.   HISTORY OF PRESENT ILLNESS:  She is 50 years of age, divorced, and has 4  boys.  Over the last several months when she has tried to walk she has  had some chest tightness up in her upper chest and left shoulder.  She  has also had it under stressful circumstances.  She has noted it at  night when she lays down and has to position herself.   She has no reflux symptoms.   She does have significant risk factors even though she is relatively  young.  These include type 2 diabetes which was diagnosed a year ago,  obesity, sedentary lifestyle.  She does not have hyperlipidemia, does  not smoke, quit in 1984.   PAST MEDICAL HISTORY:  SHE IS INTOLERANT OF PREDNISONE, CODEINE, AND  PENICILLIN.   She has had previous c-sections x3, 1996, 1998, and 2000.  She has had a  fistula repair in 1993.   FAMILY HISTORY:  Negative for premature coronary disease.   CURRENT MEDICATIONS:  1. Protonix 40 mg a day which has controlled her reflux.  2. Byetta 5 mg b.i.d.  3. Amaryl 4 mg a day.  4. Ativan 0.5 p.r.n.   SOCIAL HISTORY:  She is finishing off at [employer]!  I told her to  attend her graduation.  She is into computers, is an Training and development officer.   REVIEW OF SYSTEMS:  Other than the HPI, history of allergies, asthma,  constipation, stomach and bowel disorders, hiatal hernia, reflux, and  diabetes.  Otherwise negative review of systems.   Please note that she did have a stress Myoview here June 12, 2005 for  chest discomfort.  This showed an EF of 68% with normal wall motion.  Her exercise tolerance was reduced at 7 mets, somewhat hypertensive  response to  exercise.   PHYSICAL EXAMINATION:  She is very pleasant, well-developed, well-  nourished, overweight female.  She is 5 foot 10, weighs 275 pounds.  HEENT:  Normocephalic/atraumatic, PERRLA, extraocular movements intact,  she wears glasses, facial symmetry is normal, carotid upstrokes were  equal bilaterally without bruits, there is no JVD, thyroid is not  enlarged.  There is no tenderness to her arm, her shoulder, or her chest to  palpation.  LUNGS:  Clear, no rubs.  HEART:  Reveals a difficult to appreciate PMI, she has normal S1-S2  without rub, murmur, or gallop.  ABDOMINAL:  Soft with bowel sounds, organomegaly could not be assessed.  EXTREMITIES:  Reveal trace edema, pulses are intact.  NEURO:  Exam is intact.  SKIN:  Within normal limits.   Electrocardiogram showed sinus rhythm with PVCs.   ASSESSMENT:  1. Substernal chest discomfort going up into her left arm with  exertion.  Even though it is unlikely this is coronary ischemia,      she does have increasing risk factors.  She also had a negative      stress test in 2006.  However, in order to release her to an      exercise program, not to mention ruling out any significant      problem, we need to perform an objective assessment with an      adenosine Myoview.  2. Obesity.  3. Type 2 diabetes.   I had about a 20-30 minute discussion with Ms. Dost.  She clearly  needs some therapeutic lifestyle changes including walking 3 hours per  week, losing weight, and just taking better care of herself in general.  She needs tight blood sugar control.   PLAN:  Adenosine rest stress Myoview.  If this is negative we will see  her back on a p.r.n. basis.     Thomas C. Verl Blalock, MD, [facility]  Electronically Signed    TCW/MedQ  DD: 01/02/2007  DT: 01/02/2007  Job #: [id number]   cc:   Kristie Cowman

## 2011-01-12 NOTE — H&P (Signed)
[facility]  Patient:    Kimberly [name], Kimberly [name]                      MRN: [id number] Adm. Date:  [id number] Attending:  Lovenia Kim CC:         [facility]   History and Physical  CHIEF COMPLAINT:              Persistent irregular uterine bleeding with questionable endometrial polyp of saline sonohysterography.  HISTORY OF PRESENT ILLNESS:   Patient is a 50 year old white female, G5, P4-0-1-4 status post cesarean section x 3 who presents with irregular bleeding post C section.  PAST MEDICAL HISTORY:         Remarkable for vaginal delivery in 1993, repeat C section in 1996, 1998, and 2001.  She had a diagnostic hysterectomy in March 2000 with biopsy of an endometrial polyp, a D&C, and removal of a vulvar skin tag.  No other medical or surgical hospitalizations.  Previously had an x-ray on her right arm for a small injury.  Previous history of Chlamydia, history of irritable bowel syndrome, and rectovaginal fistula repair in 1993.  SOCIAL HISTORY:               Noncontributory.  FAMILY HISTORY:               Remarkable for hypertension and heart disease.  ALLERGIES:                    PENICILLIN, CODEINE, EPINEPHRINE, and PREDNISONE.  MEDICATIONS:                  None.  PHYSICAL EXAMINATION:  GENERAL:                      Patient is a well-developed, well-nourished, obese white female in no apparent distress.  HEENT:                        Normal.  LUNGS:                        Clear.  HEART:                        Regular rhythm.  ABDOMEN:                      Soft, obese, and nontender.  PELVIC:                       Exam reveals an anteflexed uterus.  No adnexal masses are appreciated.  EXTREMITIES:                  No cords.  NEUROLOGIC:                   Exam is nonfocal.  IMPRESSION:                   Persistent dysfunctional uterine bleeding, questionable recurrent endometrial polyp.  PLAN:                          Proceed with diagnostic hysteroscopy, possible resectoscope.  Risks of anesthesia, infection, bleeding, injury to abdominal organs and need for repair is discussed.  Possible inability to cure bleeding patterns are noted.  Possibility  of uterine perforation with bowel or bladder injury are discussed.  Patient acknowledges and desires to proceed. DD:  07/10/00 TD:  07/10/00 Job: [id number]

## 2011-01-12 NOTE — Op Note (Signed)
[facility]  Patient:    Kimberly [name]                       MRN: [id number] Proc. Date: 08/23/99 Adm. Date:  [id number] Attending:  Alwyn Pea CC:         [facility]                           Operative Report  PREOPERATIVE DIAGNOSES:       Thirty-six-and-a-half-week intrauterine pregnancy, insulin-dependent diabetes with poor compliance, mature lecithin/sphingomyelin/prostaglandin ratio, previous C-section x 2, advanced cervical dilatation.  POSTOPERATIVE DIAGNOSES:      Thirty-six-and-a-half-week intrauterine pregnancy, insulin-dependent diabetes with poor compliance, mature lecithin/sphingomyelin/prostaglandin ratio, previous C-section x 2, advanced cervical dilatation.  PROCEDURE:                    Repeat low segment transverse cesarean section.  SURGEON:                      Lovenia Kim, M.D.  ANESTHESIA:                   Spinal by Loren Racer. Charlean Merl, M.D.  ESTIMATED BLOOD LOSS:         900 cc.  COMPLICATIONS:                None.  COUNTS:                       Correct.  DISPOSITION:                  Patient to recovery in good condition.  FINDINGS:                     Full-term living female from occipitotransverse position, 7 pounds 12 ounces, Apgars 8/9, no complications; pediatricians in attendance.  Placenta manually, intact.  Normal tubes and ovaries noted.  DESCRIPTION OF PROCEDURE:     After being apprised of the risks of anesthesia, infection, bleeding, injury to abdominal organs with need for repair, patient is brought to the operating room where she is administered spinal anesthetic and prepped and draped in the usual sterile fashion.  After achieving adequate anesthesia, Foley catheter placed.  A Pfannenstiel skin incision is made in the  area of the old incision with a scalpel and carried down to the fascia, dissecting sharply through dense scar tissue and rectus muscles are identified in  the midline. Omentum appears to be adhesed peripherally to the lower peritoneal side of the rectus muscles and to the parietal peritoneum; this is dissected sharply and atraumatically.  Bladder blade is placed.  Visceroperitoneum is then scored in  smile-like fashion and uterus scored in a smile-like fashion, making a Kerr hysterotomy incision, clear fluid noted upon amniotomy; delivery of a 7-pound 12-ounce female from a left occipitotransverse position, handed to pediatricians n attendance, Apgars of 8/9 noted.  Placenta is manually, intact, three-vessel cord noted.  Uterus exteriorized; normal tubes and ovaries noted.  Uterus is curetted using a dry lap pack.  Incision is closed using a 0 Monocryl in a continuous-running fashion; good hemostasis noted.  Bladder flap inspected and found to be hemostatic.  Rectus muscles reapproximated in the midline, fascia closed using a 0 Vicryl in continuous-running fashion and skin closed using staples; good hemostasis noted.  Patient tolerates procedure  well; transferred o recovery in good condition. DD:  08/23/99 TD:  08/24/99 Job: [id number]

## 2011-01-12 NOTE — Op Note (Signed)
[facility]  Patient:    [name], Kimberly [name]                      MRN: [id number] Proc. Date: 07/10/00 Adm. Date:  [id number] Attending:  Lovenia Kim                           Operative Report  PREOPERATIVE DIAGNOSES:       Persistent dysfunctional uterine bleeding with questionable endometrial polyp on ultrasound.  POSTOPERATIVE DIAGNOSIS:      Multiple endometrial polyps.  PROCEDURE:                    Diagnostic hysteroscopy, resectoscope, and polypectomy, dilation and curettage.  SURGEON:                      Lovenia Kim, M.D.  ANESTHESIA:                   General, paracervical.  ESTIMATED BLOOD LOSS:         Less than 50 cc.  COMPLICATIONS:                None.  FLUID DEFICIT:                90 cc.                                Patient in recovery in good condition.  COUNTS:                       Correct.  BRIEF OPERATIVE NOTE:         After being apprised of the risks of anesthesia, infection, bleeding, uterine perforation with injury to abdominal organs and need for repair, patient is brought to the operating room where she is administered IV sedation without difficulty, prepped and draped in the usual sterile fashion.  ______ 2% 30 cc infiltrated in a standard paracervical block and dilute Pitressin solution 16 cc total placed bilaterally at the cervicovaginal junction for a total of 16 cc.  Uterus sounds to 8 cm dilated up to a #25 Pratt dilator.  Diagnostic hysteroscope placed.  Visualization revealed a posterolateral endometrial polyp.  Diagnostic scope was removed and cervix was further dilated up to a #33 Pratt dilator after which the resectoscope is placed with use of a hysteroscope and resection of multiple posterior polyp fragments in addition to a large left posterolateral polyp are resected without difficulty using a double loop.  At this time D&C is performed.  Good hemostasis achieved.  Fluid deficit 90 cc  noted. Revisualization reveals normal tubal ostia, otherwise normal endometrial cavity.  All instruments are removed.  Patient tolerates procedure well.  Is transferred to recovery in good condition.  SPECIMEN:                     Endometrial curettings and multiple polyps. DD:  07/10/00 TD:  07/10/00 Job: [id number]

## 2011-03-20 ENCOUNTER — Encounter: Payer: Self-pay | Admitting: Cardiology

## 2011-03-23 ENCOUNTER — Institutional Professional Consult (permissible substitution): Payer: Medicaid Other | Admitting: Cardiology

## 2011-04-19 ENCOUNTER — Institutional Professional Consult (permissible substitution): Payer: Medicaid Other | Admitting: Cardiology

## 2011-05-01 ENCOUNTER — Encounter: Payer: Self-pay | Admitting: Cardiology

## 2011-07-21 ENCOUNTER — Other Ambulatory Visit: Payer: Self-pay | Admitting: Family Medicine

## 2011-07-23 NOTE — Telephone Encounter (Signed)
Last seen in this office is 09/08/2009. Please advise    KP

## 2011-07-24 NOTE — Telephone Encounter (Signed)
Pt has new pcp--- do not fill

## 2011-08-03 ENCOUNTER — Other Ambulatory Visit: Payer: Self-pay | Admitting: Internal Medicine

## 2011-08-03 DIAGNOSIS — E042 Nontoxic multinodular goiter: Secondary | ICD-10-CM

## 2011-08-08 ENCOUNTER — Other Ambulatory Visit: Payer: Medicaid Other

## 2011-08-25 ENCOUNTER — Emergency Department (HOSPITAL_BASED_OUTPATIENT_CLINIC_OR_DEPARTMENT_OTHER)
Admission: EM | Admit: 2011-08-25 | Discharge: 2011-08-25 | Disposition: A | Discharge status: Home / Self Care | Payer: Medicaid Other | Attending: Emergency Medicine | Admitting: Emergency Medicine

## 2011-08-25 ENCOUNTER — Emergency Department (INDEPENDENT_AMBULATORY_CARE_PROVIDER_SITE_OTHER): Payer: Medicaid Other

## 2011-08-25 ENCOUNTER — Encounter (HOSPITAL_BASED_OUTPATIENT_CLINIC_OR_DEPARTMENT_OTHER): Payer: Self-pay | Admitting: *Deleted

## 2011-08-25 DIAGNOSIS — Z79899 Other long term (current) drug therapy: Secondary | ICD-10-CM | POA: Insufficient documentation

## 2011-08-25 DIAGNOSIS — R197 Diarrhea, unspecified: Secondary | ICD-10-CM

## 2011-08-25 DIAGNOSIS — R109 Unspecified abdominal pain: Secondary | ICD-10-CM | POA: Insufficient documentation

## 2011-08-25 DIAGNOSIS — R42 Dizziness and giddiness: Secondary | ICD-10-CM | POA: Insufficient documentation

## 2011-08-25 DIAGNOSIS — K625 Hemorrhage of anus and rectum: Secondary | ICD-10-CM | POA: Insufficient documentation

## 2011-08-25 DIAGNOSIS — R110 Nausea: Secondary | ICD-10-CM | POA: Insufficient documentation

## 2011-08-25 DIAGNOSIS — R911 Solitary pulmonary nodule: Secondary | ICD-10-CM

## 2011-08-25 HISTORY — DX: Anxiety disorder, unspecified: F41.9

## 2011-08-25 LAB — DIFFERENTIAL
Basophils Relative: 1 % (ref 0–1)
Eosinophils Absolute: 0.2 10*3/uL (ref 0.0–0.7)
Eosinophils Relative: 4 % (ref 0–5)
Monocytes Relative: 7 % (ref 3–12)
Neutrophils Relative %: 58 % (ref 43–77)

## 2011-08-25 LAB — CBC
MCH: 27.7 pg (ref 26.0–34.0)
MCHC: 33.2 g/dL (ref 30.0–36.0)
MCV: 83.4 fL (ref 78.0–100.0)
Platelets: 165 10*3/uL (ref 150–400)

## 2011-08-25 LAB — URINALYSIS, ROUTINE W REFLEX MICROSCOPIC
Bilirubin Urine: NEGATIVE
Leukocytes, UA: NEGATIVE
Nitrite: NEGATIVE
Specific Gravity, Urine: 1.015 (ref 1.005–1.030)
Urobilinogen, UA: 1 mg/dL (ref 0.0–1.0)
pH: 7 (ref 5.0–8.0)

## 2011-08-25 LAB — LACTIC ACID, PLASMA: Lactic Acid, Venous: 1.7 mmol/L (ref 0.5–2.2)

## 2011-08-25 LAB — COMPREHENSIVE METABOLIC PANEL
Albumin: 3.9 g/dL (ref 3.5–5.2)
Alkaline Phosphatase: 118 U/L — ABNORMAL HIGH (ref 39–117)
BUN: 8 mg/dL (ref 6–23)
Calcium: 9.5 mg/dL (ref 8.4–10.5)
GFR calc Af Amer: >90 mL/min (ref >90)
Glucose, Bld: 182 mg/dL — ABNORMAL HIGH (ref 70–99)
Potassium: 3.8 mEq/L (ref 3.5–5.1)
Sodium: 139 mEq/L (ref 135–145)
Total Protein: 7.6 g/dL (ref 6.0–8.3)

## 2011-08-25 LAB — LIPASE, BLOOD: Lipase: 14 U/L (ref 11–59)

## 2011-08-25 LAB — OCCULT BLOOD X 1 CARD TO LAB, STOOL: Fecal Occult Bld: POSITIVE

## 2011-08-25 MED ORDER — ONDANSETRON HCL 4 MG/2ML IJ SOLN
4.0000 mg | Freq: Once | INTRAMUSCULAR | Status: AC
  Administered 2011-08-25: 4 mg via INTRAVENOUS
  Filled 2011-08-25: qty 2

## 2011-08-25 MED ORDER — IOHEXOL 300 MG/ML  SOLN
20.0000 mL | INTRAMUSCULAR | Status: AC
  Administered 2011-08-25 (×2): 20 mL via ORAL

## 2011-08-25 MED ORDER — ONDANSETRON 8 MG PO TBDP: 8.0000 mg | ORAL_TABLET | Freq: Three times a day (TID) | ORAL | Status: AC | PRN

## 2011-08-25 MED ORDER — IOHEXOL 300 MG/ML  SOLN
100.0000 mL | Freq: Once | INTRAMUSCULAR | Status: AC | PRN
  Administered 2011-08-25: 100 mL via INTRAVENOUS

## 2011-08-25 MED ORDER — ONDANSETRON HCL 4 MG/2ML IJ SOLN: 4.0000 mg | Freq: Once | INTRAMUSCULAR | Status: DC

## 2011-08-25 MED ORDER — SODIUM CHLORIDE 0.9 % IV BOLUS (SEPSIS)
1000.0000 mL | Freq: Once | INTRAVENOUS | Status: AC
  Administered 2011-08-25: 1000 mL via INTRAVENOUS

## 2011-08-25 NOTE — ED Provider Notes (Signed)
History     CSN: [id number]  Arrival date & time 08/25/11  1255   First MD Initiated Contact with Patient 08/25/11 1416      Chief Complaint  Patient presents with  . Rectal Bleeding    (Consider location/radiation/quality/duration/timing/severity/associated sxs/prior treatment) HPI Patient is a 50 year old female who presents today complaining of 2 days of lower abdominal pain as well as loose stools with blood and mucus. Patient has no recent travel outside the [place]. She has no contacts with similar symptoms and has not eaten any new places. Patient has denied any lightheadedness today but did feel lightheaded yesterday. She was seen by her doctor yesterday who wanted to perform a CAT scan. She was referred to our location at that time but was unable to find [place]. She is here today for the same reason. She says her pain has improved. She denies any urinary symptoms. Patient has no history of this previously. She denies any fevers. Patient had some nausea but no vomiting. She is a diabetic and her sugars have been in the 200s but this is not unusual for her. Patient reports that her pain is only 5/10. She does not feel that she needs medication for this at this time. She's not currently nauseous. She also denies any chest pain or shortness of breath that might indicate a symptomatic anemia. Past Medical History  Diagnosis Date  . DM2 (diabetes mellitus, type 2)   . Depression   . Anxiety     Past Surgical History  Procedure Date  . Caesarean section   . Rectal fistula   . Uterine fibroid surgery     History reviewed. No pertinent family history.  History  Substance Use Topics  . Smoking status: Not on file  . Smokeless tobacco: Not on file  . Alcohol Use: Yes    OB History    Grav Para Term Preterm Abortions TAB SAB Ect Mult Living                  Review of Systems  Constitutional: Negative.   HENT: Negative.   Eyes: Negative.   Respiratory: Negative.     Cardiovascular: Negative.   Gastrointestinal: Positive for nausea, abdominal pain, diarrhea and blood in stool. Negative for vomiting.  Genitourinary: Negative.   Musculoskeletal: Positive for back pain.  Skin: Negative.   Neurological: Positive for light-headedness.  Hematological: Negative.   Psychiatric/Behavioral: Negative.   All other systems reviewed and are negative.    Allergies  Codeine; Metoclopramide hcl; Penicillins; and Prednisone  Home Medications   Current Outpatient Rx  Name Route Sig Dispense Refill  . ALBUTEROL SULFATE HFA 108 (90 BASE) MCG/ACT IN AERS Inhalation Inhale 2 puffs into the lungs 4 (four) times daily.      Marland Kitchen BETAMETHASONE DIPROPIONATE 0.05 % EX CREA Topical Apply topically 2 (two) times daily as needed.      . BUDESONIDE-FORMOTEROL FUMARATE 80-4.5 MCG/ACT IN AERO Inhalation Inhale 2 puffs into the lungs 2 (two) times daily.      . BUPROPION HCL ER (XL) 150 MG PO TB24 Oral Take 150 mg by mouth every morning. **BRAND NAME MEDICALLY NECESSARY**     . CETIRIZINE HCL 10 MG PO TABS Oral Take 10 mg by mouth daily.      Marland Kitchen EXENATIDE 5 MCG/0.02ML [place] SOLN Subcutaneous Inject into the skin 2 (two) times daily with a meal.      . GLUCOSE BLOOD VI STRP Other 1 each by Other route as directed. Use  as instructed     . INSULIN ASPART 100 UNIT/ML [place] SOLN Subcutaneous Inject 5 Units into the skin 3 (three) times daily before meals.      . INSULIN DETEMIR 100 UNIT/ML [place] SOLN Subcutaneous Inject into the skin daily. 40-45 units     . INSULIN PEN NEEDLE 30G X 8 MM MISC Subcutaneous Inject 1 packet into the skin as directed.      Marland Kitchen LORAZEPAM 0.5 MG PO TABS Oral Take 0.5 mg by mouth 3 (three) times daily as needed.      Marland Kitchen PANTOPRAZOLE SODIUM 40 MG PO TBEC  take 1 tablet by mouth once daily 30 tablet 0  . PRAMLINTIDE ACETATE 600 MCG/ML [place] SOLN Subcutaneous Inject 60 mcg into the skin 3 (three) times daily.      Marland Kitchen PRODIGY INSULIN SYRINGE 31G X 5/16" 0.5 ML MISC  use as directed  100 each 1  . TRIAMCINOLONE ACETONIDE 55 MCG/ACT NA INHA Nasal Place 2 sprays into the nose daily.        BP 159/70  Pulse 82  Temp(Src) 97.7 F (36.5 C) (Oral)  Resp 18  Ht 5' 10"  (1.778 m)  Wt 279 lb (126.554 kg)  BMI 40.03 kg/m2  SpO2 98%  Physical Exam  Nursing note and vitals reviewed. Constitutional: She is oriented to person, place, and time. She appears well-developed and well-nourished. No distress.  HENT:  Head: Normocephalic and atraumatic.  Eyes: Conjunctivae and EOM are normal. Pupils are equal, round, and reactive to light.  Neck: Normal range of motion.  Cardiovascular: Normal rate, regular rhythm, normal heart sounds and intact distal pulses.  Exam reveals no gallop and no friction rub.   No murmur heard. Pulmonary/Chest: Effort normal and breath sounds normal. No respiratory distress. She has no wheezes. She has no rales.  Abdominal: Soft. Bowel sounds are normal. She exhibits no distension. There is tenderness. There is no rebound and no guarding.       Mild bilateral lower quadrant tenderness without rebound or guarding  Genitourinary: Guaiac positive stool.       Patient had normal rectal tone with blood visible on the glove.  Musculoskeletal: Normal range of motion. She exhibits no edema and no tenderness.  Neurological: She is alert and oriented to person, place, and time. No cranial nerve deficit. She exhibits normal muscle tone. Coordination normal.  Skin: Skin is warm and dry. No rash noted.  Psychiatric: She has a normal mood and affect.    ED Course  Procedures (including critical care time)  Labs Reviewed  URINALYSIS, ROUTINE W REFLEX MICROSCOPIC - Abnormal; Notable for the following:    Glucose, UA 100 (*)    All other components within normal limits  CBC  DIFFERENTIAL  LACTIC ACID, PLASMA  OCCULT BLOOD X 1 CARD TO LAB, STOOL  COMPREHENSIVE METABOLIC PANEL  LIPASE, BLOOD   No results found.   No diagnosis found.    MDM  Patient was  evaluated by myself. Based on history and my exam patient did have workup for her abdominal pain with labs as well as a CT of abdomen and pelvis. Patient was given a liter of normal saline IV bolus but did not require any pain or nausea medications. Patient did have a normal lactate, CBC, and urinalysis. Lipase was in normal limits with liver function only mildly elevated with transaminases in the 40s.  CT scan was essentially unremarkable for abdominal process. Patient did not have any diarrhea or vomiting while here. She  remained him in medically stable. Patient did have a lung nodule which was noted to be significantly increased compared to previous. Patient was notified of this and told that she should followup with her primary care doctor to arrange for a chest CT. She also was referred back to her own gastroenterologist to discuss her rectal bleeding and schedule colonoscopy. Patient was given precautions for reasons to return such as chest pain, shortness of breath, lightheadedness, or fevers. She was discharged in good condition with a prescription for Zofran. She declined pain medication at discharge.        Chauncy Passy, MD 08/25/11 1710

## 2011-08-25 NOTE — ED Notes (Signed)
Pt d/c to home- declined ordered dose of zofran prior to leaving- alert, ambulatory without need for assist- rx x 1 for zofran

## 2011-08-25 NOTE — ED Notes (Signed)
Pt states that she woke up yest a.m. At 0330 with severe abd cramping, eventually diarrhea which was pink-tinged then blood-tinged. Seen at Urgent Care and told to come here for CT scan. "Couldn't find last night", so came today. Passing "clots" No dizziness today, but was dizzy yest.

## 2011-08-27 ENCOUNTER — Other Ambulatory Visit: Payer: Self-pay | Admitting: Family Medicine

## 2011-08-30 ENCOUNTER — Other Ambulatory Visit: Payer: Self-pay | Admitting: *Deleted

## 2011-08-30 ENCOUNTER — Ambulatory Visit
Admission: RE | Admit: 2011-08-30 | Discharge: 2011-08-30 | Disposition: A | Discharge status: Home / Self Care | Payer: Medicaid Other | Source: Ambulatory Visit | Attending: *Deleted | Admitting: *Deleted

## 2011-08-30 DIAGNOSIS — R911 Solitary pulmonary nodule: Secondary | ICD-10-CM

## 2011-08-30 MED ORDER — IOHEXOL 300 MG/ML  SOLN
75.0000 mL | Freq: Once | INTRAMUSCULAR | Status: AC | PRN
  Administered 2011-08-30: 75 mL via INTRAVENOUS

## 2011-09-11 ENCOUNTER — Encounter: Payer: Self-pay | Admitting: Surgery

## 2011-09-11 ENCOUNTER — Institutional Professional Consult (permissible substitution) (INDEPENDENT_AMBULATORY_CARE_PROVIDER_SITE_OTHER): Payer: Medicaid Other | Admitting: Surgery

## 2011-09-11 VITALS — BP 160/87 | HR 82 | Resp 18 | Ht 70.0 in | Wt 275.0 lb

## 2011-09-11 DIAGNOSIS — R911 Solitary pulmonary nodule: Secondary | ICD-10-CM

## 2011-09-11 DIAGNOSIS — J984 Other disorders of lung: Secondary | ICD-10-CM

## 2011-09-11 NOTE — Progress Notes (Signed)
[address]            [place] [address]          [phone]      PCP is Kennon Holter, MD Referring Provider is Kennon Holter, MD  Chief Complaint  Patient presents with  . Lung Mass    Referral from Dr [place] Desanctis for surgical eval on Lung Nodule, Chest CT 08/30/2011     HPI:  The patient is a 51 year old woman with a remote heavy smoking history who reports having some abdominal pain back in May of 2011 for which a CT scan of the abdomen was performed. This showed an incidental 8 x 13 x 11 mm left lower lobe lung nodule at the base of the left lung posteriorly. She had a CT scan the chest to confirm this nodule and showed no other abnormality. It was decided that this small nodule should be followed and a repeat CT scan of the chest was done on 08/30/2011. This showed that the nodule had increased in size compared to previous scan and was now 12 x 16 x 13 mm. There were no other lung lesions seen. There is no mediastinal or hilar adenopathy. There is diffuse fatty infiltration of the liver and mild splenomegaly.  Past Medical History  Diagnosis Date  . DM2 (diabetes mellitus, type 2)   . Depression   . Anxiety     Past Surgical History  Procedure Date  . Caesarean section   . Rectal fistula   . Uterine fibroid surgery     History reviewed. No pertinent family history.  Social History History  Substance Use Topics  . Smoking status: Former Smoker    Types: Cigarettes    Quit date: 08/27/1982  . Smokeless tobacco: Never Used  . Alcohol Use: No    Current Outpatient Prescriptions  Medication Sig Dispense Refill  . albuterol (PROAIR HFA) 108 (90 BASE) MCG/ACT inhaler Inhale 2 puffs into the lungs 4 (four) times daily.        Marland Kitchen buPROPion (WELLBUTRIN XL) 150 MG 24 hr tablet Take 150 mg by mouth every morning. **BRAND NAME MEDICALLY NECESSARY**       . glucose blood test strip 1 each by Other route as directed. Use as instructed       .  insulin aspart (NOVOLOG FLEXPEN) 100 UNIT/ML injection Inject 35 Units into the skin 3 (three) times daily before meals.       . insulin detemir (LEVEMIR FLEXPEN) 100 UNIT/ML injection Inject into the skin daily. 40-45 units       . Insulin Pen Needle (NOVOFINE) 30G X 8 MM MISC Inject 1 packet into the skin as directed.        Marland Kitchen LORazepam (ATIVAN) 0.5 MG tablet Take 0.5 mg by mouth 3 (three) times daily as needed.        . pantoprazole (PROTONIX) 40 MG tablet take 1 tablet by mouth once daily  30 tablet  0  . pramlintide (SYMLIN) 600 MCG/ML injection Inject 60 mcg into the skin 3 (three) times daily.        Marland Kitchen PRODIGY INSULIN SYRINGE 31G X 5/16" 0.5 ML MISC use as directed  100 each  1    Allergies  Allergen Reactions  . Codeine   . Metoclopramide Hcl   . Penicillins     REACTION: unspecified  . Prednisone  Review of Systems:  General: She denies any fever or chills. She's had no recent weight changes. She denies fatigue. Eyes: Negative ENT: Negative Endocrine: She has adult onset diabetes since 2008. She denies hypothyroidism. Heart: She denies any chest pain or pressure. She does report some exertional dyspnea which has been present for years. She denies PND and orthopnea. She's had no peripheral edema or palpitations. Lungs: She denies any cough or sputum production. She's had no hemoptysis. GI: She denies nausea and vomiting. She's had no melena or bright red blood per rectum. GU: She denies dysuria and hematuria. Neurological: She denies any focal weakness or numbness. She denies dizziness and syncope. She's never had a TIA or stroke. She does have migraine headaches. Psychiatric: She reports a history of depression. Musculoskeletal: She reports a history of myalgia. She has pain in her left hip for years.  BP 160/87  Pulse 82  Resp 18  Ht 5' 10"  (1.778 m)  Wt 275 lb (124.739 kg)  BMI 39.46 kg/m2  SpO2 98% Physical Exam:  She is an obese white female in no  distress. HEENT: Normocephalic and atraumatic. Pupils are equal and reactive to light and accommodation. Extraocular muscles are intact. Oropharynx is clear. Neck: Carotid pulses are palpable bilaterally. There are no bruits. There is no adenopathy or thyromegaly. Heart: Regular rate and rhythm with normal S1 and S2. There is no murmur, rub, or gallop. Lungs: Clear Abdomen: Obese, soft and nontender, no masses or organomegaly. Bowel sounds are active. Extremities: There is no peripheral edema. Pedal pulses are palpable bilaterally. Skin is warm and dry. Neurological: Alert and oriented x3. Motor and sensory exams are grossly normal.  Diagnostic Tests: As noted in the history of present illness  Impression:  She has a 12 x 16 x 13 mm smoothly marginated nodule in the posterior aspect of the left lower lobe which is increased in size since 05/ 2011. This is most likely a benign tumor but could be a low grade malignant tumor like a carcinoid or a very slowly growing lung cancer. This is in a location that should be amenable to wedge resection. Since it is increasing in size and I think it should come out. I don't think there is any definite benefit to performing a needle biopsy for diagnosis preoperatively. I think it would be best to proceed with a left VATS and wedge resection of this lesion. I discussed the procedure with the patient and her father. I discussed the possibility of needing to perform a small thoracotomy for resection. I discussed the benefits and risks of surgery including but not limited to bleeding, blood transfusion, infection, persistent airleak from the lung, postoperative respiratory difficulty or other organ dysfunction. She understands all this and would like to proceed with surgical treatment.  Plan:  She will call our office in the near future to schedule surgery.

## 2011-09-12 ENCOUNTER — Encounter: Payer: Medicaid Other | Admitting: Cardiothoracic Surgery

## 2011-10-05 ENCOUNTER — Telehealth: Payer: Self-pay | Admitting: Family Medicine

## 2011-10-05 NOTE — Telephone Encounter (Signed)
Patient see's Kennon Holter-- Last seen in this office 08/2009

## 2011-10-05 NOTE — Telephone Encounter (Signed)
Refill: Pantoprazole SOD DR 49m tab. Take 1 tablet by mouth once daily. Qty. 30. Last fill 08-27-11

## 2011-10-11 ENCOUNTER — Other Ambulatory Visit: Payer: Self-pay

## 2011-10-11 DIAGNOSIS — D381 Neoplasm of uncertain behavior of trachea, bronchus and lung: Secondary | ICD-10-CM

## 2011-10-16 ENCOUNTER — Encounter (HOSPITAL_COMMUNITY): Payer: Self-pay | Admitting: Respiratory Therapy

## 2011-10-24 ENCOUNTER — Encounter (HOSPITAL_COMMUNITY): Payer: Self-pay

## 2011-10-24 ENCOUNTER — Encounter (HOSPITAL_COMMUNITY)
Admission: RE | Admit: 2011-10-24 | Discharge: 2011-10-24 | Disposition: A | Discharge status: Home / Self Care | Payer: Medicaid Other | Source: Ambulatory Visit | Attending: Surgery | Admitting: Surgery

## 2011-10-24 ENCOUNTER — Ambulatory Visit (HOSPITAL_COMMUNITY)
Admission: RE | Admit: 2011-10-24 | Discharge: 2011-10-24 | Disposition: A | Discharge status: Home / Self Care | Payer: Medicaid Other | Source: Ambulatory Visit | Attending: Surgery | Admitting: Surgery

## 2011-10-24 ENCOUNTER — Other Ambulatory Visit: Payer: Self-pay

## 2011-10-24 DIAGNOSIS — Z01818 Encounter for other preprocedural examination: Secondary | ICD-10-CM | POA: Insufficient documentation

## 2011-10-24 DIAGNOSIS — D381 Neoplasm of uncertain behavior of trachea, bronchus and lung: Secondary | ICD-10-CM

## 2011-10-24 DIAGNOSIS — J984 Other disorders of lung: Secondary | ICD-10-CM | POA: Insufficient documentation

## 2011-10-24 DIAGNOSIS — Z01812 Encounter for preprocedural laboratory examination: Secondary | ICD-10-CM | POA: Insufficient documentation

## 2011-10-24 HISTORY — DX: Pneumonia, unspecified organism: J18.9

## 2011-10-24 HISTORY — DX: Shortness of breath: R06.02

## 2011-10-24 HISTORY — DX: Other specified postprocedural states: Z98.890

## 2011-10-24 HISTORY — DX: Dermatitis, unspecified: L30.9

## 2011-10-24 HISTORY — DX: Other complications of anesthesia, initial encounter: T88.59XA

## 2011-10-24 HISTORY — DX: Nausea with vomiting, unspecified: R11.2

## 2011-10-24 HISTORY — DX: Adverse effect of unspecified anesthetic, initial encounter: T41.45XA

## 2011-10-24 HISTORY — DX: Stress incontinence (female) (male): N39.3

## 2011-10-24 HISTORY — DX: Other nonspecific abnormal finding of lung field: R91.8

## 2011-10-24 HISTORY — DX: Polyp of colon: K63.5

## 2011-10-24 HISTORY — DX: Bronchitis, not specified as acute or chronic: J40

## 2011-10-24 HISTORY — DX: Irritable bowel syndrome, unspecified: K58.9

## 2011-10-24 HISTORY — DX: Gastro-esophageal reflux disease without esophagitis: K21.9

## 2011-10-24 HISTORY — DX: Headache: R51

## 2011-10-24 LAB — BLOOD GAS, ARTERIAL
O2 Saturation: 97.9 %
Patient temperature: 98.6
TCO2: 25.2 mmol/L (ref 0–100)
pH, Arterial: 7.455 — ABNORMAL HIGH (ref 7.350–7.400)

## 2011-10-24 LAB — COMPREHENSIVE METABOLIC PANEL
Albumin: 3.9 g/dL (ref 3.5–5.2)
Alkaline Phosphatase: 109 U/L (ref 39–117)
BUN: 10 mg/dL (ref 6–23)
Calcium: 9.8 mg/dL (ref 8.4–10.5)
GFR calc Af Amer: >90 mL/min (ref >90)
Glucose, Bld: 126 mg/dL — ABNORMAL HIGH (ref 70–99)
Potassium: 3.9 mEq/L (ref 3.5–5.1)
Total Protein: 7.5 g/dL (ref 6.0–8.3)

## 2011-10-24 LAB — CBC
HCT: 39.2 % (ref 36.0–46.0)
Hemoglobin: 13.3 g/dL (ref 12.0–15.0)
MCH: 28.1 pg (ref 26.0–34.0)
MCHC: 33.9 g/dL (ref 30.0–36.0)
RDW: 14.5 % (ref 11.5–15.5)

## 2011-10-24 LAB — PROTIME-INR
INR: 1.04 (ref 0.00–1.49)
Prothrombin Time: 13.8 seconds (ref 11.6–15.2)

## 2011-10-24 LAB — APTT: aPTT: 39 seconds — ABNORMAL HIGH (ref 24–37)

## 2011-10-24 LAB — URINALYSIS, ROUTINE W REFLEX MICROSCOPIC
Bilirubin Urine: NEGATIVE
Hgb urine dipstick: NEGATIVE
Ketones, ur: NEGATIVE mg/dL
Nitrite: NEGATIVE
Protein, ur: NEGATIVE mg/dL
Urobilinogen, UA: 0.2 mg/dL (ref 0.0–1.0)

## 2011-10-24 LAB — TYPE AND SCREEN
ABO/RH(D): O POS
Antibody Screen: NEGATIVE

## 2011-10-24 LAB — SURGICAL PCR SCREEN: Staphylococcus aureus: NEGATIVE

## 2011-10-24 LAB — ABO/RH: ABO/RH(D): O POS

## 2011-10-24 LAB — URINE MICROSCOPIC-ADD ON

## 2011-10-24 NOTE — Progress Notes (Signed)
Left elbow has bandage over it d/t spot that was draining  Pt was on ZPAK a week ago d/t cough/sore throat/drainage

## 2011-10-24 NOTE — Progress Notes (Signed)
Average fasting around 248

## 2011-10-24 NOTE — Pre-Procedure Instructions (Signed)
[facility]  10/24/2011   Your procedure is scheduled on:  Fri, Mar 1 @ 0730  Report to [facility] at [place].  Call this number if you have problems the morning of surgery: [phone]   Remember:   Do not eat food:After Midnight.  May have clear liquids: up to 4 Hours before arrival.(until 1:30 am)  Clear liquids include soda, tea, black coffee, apple or grape juice, broth.  Take these medicines the morning of surgery with A SIP OF WATER: Albuterol,Ativan,Protonix<bring your inhaler with you>   Do not wear jewelry, make-up or nail polish.  Do not wear lotions, powders, or perfumes. You may wear deodorant.  Do not shave 48 hours prior to surgery.  Do not bring valuables to the hospital.  Contacts, dentures or bridgework may not be worn into surgery.  Leave suitcase in the car. After surgery it may be brought to your room.  For patients admitted to the hospital, checkout time is 11:00 AM the day of discharge.   Patients discharged the day of surgery will not be allowed to drive home.  Name and phone number of your driver:   Special Instructions: CHG Shower Use Special Wash: 1/2 bottle night before surgery and 1/2 bottle morning of surgery.   Please read over the following fact sheets that you were given: Pain Booklet, Coughing and Deep Breathing, Blood Transfusion Information, MRSA Information and Surgical Site Infection Prevention

## 2011-10-24 NOTE — Progress Notes (Signed)
Sister has a hard time waking up from anesthesia

## 2011-10-24 NOTE — Progress Notes (Signed)
Pt saw Dr.Wall about 87yr ago d/t hurting under left arm;echo and stress done and denies having a heart cath

## 2011-10-25 MED ORDER — VANCOMYCIN HCL 1000 MG IV SOLR
1500.0000 mg | INTRAVENOUS | Status: AC
  Administered 2011-10-26: 1500 mg via INTRAVENOUS
  Filled 2011-10-25 (×2): qty 1500

## 2011-10-25 NOTE — H&P (Signed)
[address]            [place] [address]          [phone]      PCP is Kennon Holter, MD  Referring Provider is Kennon Holter, MD  Chief Complaint   Patient presents with   .  Lung Mass     Referral from Dr [place] Desanctis for surgical eval on Lung Nodule, Chest CT 08/30/2011   HPI:  The patient is a 51 year old woman with a remote heavy smoking history who reports having some abdominal pain back in May of 2011 for which a CT scan of the abdomen was performed. This showed an incidental 8 x 13 x 11 mm left lower lobe lung nodule at the base of the left lung posteriorly. She had a CT scan the chest to confirm this nodule and showed no other abnormality. It was decided that this small nodule should be followed and a repeat CT scan of the chest was done on 08/30/2011. This showed that the nodule had increased in size compared to previous scan and was now 12 x 16 x 13 mm. There were no other lung lesions seen. There is no mediastinal or hilar adenopathy. There is diffuse fatty infiltration of the liver and mild splenomegaly.  Past Medical History   Diagnosis  Date   .  DM2 (diabetes mellitus, type 2)    .  Depression    .  Anxiety     Past Surgical History   Procedure  Date   .  Caesarean section    .  Rectal fistula    .  Uterine fibroid surgery    History reviewed. No pertinent family history.  Social History  History   Substance Use Topics   .  Smoking status:  Former Smoker     Types:  Cigarettes     Quit date:  08/27/1982   .  Smokeless tobacco:  Never Used   .  Alcohol Use:  No    Current Outpatient Prescriptions   Medication  Sig  Dispense  Refill   .  albuterol (PROAIR HFA) 108 (90 BASE) MCG/ACT inhaler  Inhale 2 puffs into the lungs 4 (four) times daily.     Marland Kitchen  buPROPion (WELLBUTRIN XL) 150 MG 24 hr tablet  Take 150 mg by mouth every morning. **BRAND NAME MEDICALLY NECESSARY**     .  glucose blood test strip  1 each by Other route as directed.  Use as instructed     .  insulin aspart (NOVOLOG FLEXPEN) 100 UNIT/ML injection  Inject 35 Units into the skin 3 (three) times daily before meals.     .  insulin detemir (LEVEMIR FLEXPEN) 100 UNIT/ML injection  Inject into the skin daily. 40-45 units     .  Insulin Pen Needle (NOVOFINE) 30G X 8 MM MISC  Inject 1 packet into the skin as directed.     Marland Kitchen  LORazepam (ATIVAN) 0.5 MG tablet  Take 0.5 mg by mouth 3 (three) times daily as needed.     .  pantoprazole (PROTONIX) 40 MG tablet  take 1 tablet by mouth once daily  30 tablet  0   .  pramlintide (SYMLIN) 600 MCG/ML injection  Inject 60 mcg into the skin 3 (three) times daily.     Marland Kitchen  PRODIGY INSULIN SYRINGE 31G X 5/16" 0.5 ML  MISC  use as directed  100 each  1    Allergies   Allergen  Reactions   .  Codeine    .  Metoclopramide Hcl    .  Penicillins      REACTION: unspecified   .  Prednisone    Review of Systems:  General: She denies any fever or chills. She's had no recent weight changes. She denies fatigue.  Eyes: Negative  ENT: Negative  Endocrine: She has adult onset diabetes since 2008. She denies hypothyroidism.  Heart: She denies any chest pain or pressure. She does report some exertional dyspnea which has been present for years. She denies PND and orthopnea. She's had no peripheral edema or palpitations.  Lungs: She denies any cough or sputum production. She's had no hemoptysis.  GI: She denies nausea and vomiting. She's had no melena or bright red blood per rectum.  GU: She denies dysuria and hematuria.  Neurological: She denies any focal weakness or numbness. She denies dizziness and syncope. She's never had a TIA or stroke. She does have migraine headaches.  Psychiatric: She reports a history of depression.  Musculoskeletal: She reports a history of myalgia. She has pain in her left hip for years.  BP 160/87  Pulse 82  Resp 18  Ht 5' 10"  (1.778 m)  Wt 275 lb (124.739 kg)  BMI 39.46 kg/m2  SpO2 98%  Physical Exam:  She  is an obese white female in no distress.  HEENT: Normocephalic and atraumatic. Pupils are equal and reactive to light and accommodation. Extraocular muscles are intact. Oropharynx is clear.  Neck: Carotid pulses are palpable bilaterally. There are no bruits. There is no adenopathy or thyromegaly.  Heart: Regular rate and rhythm with normal S1 and S2. There is no murmur, rub, or gallop.  Lungs: Clear  Abdomen: Obese, soft and nontender, no masses or organomegaly. Bowel sounds are active.  Extremities: There is no peripheral edema. Pedal pulses are palpable bilaterally. Skin is warm and dry.  Neurological: Alert and oriented x3. Motor and sensory exams are grossly normal.    Diagnostic Tests:   CT CHEST WITH CONTRAST  Technique: Multidetector CT imaging of the chest was performed  following the standard protocol during bolus administration of  intravenous contrast.  Contrast: 25m OMNIPAQUE IOHEXOL 300 MG/ML IV SOLN  Comparison: CT chest scan of 01/06/2010  Findings: These solid nodule within the posterior left lower lobe  does appear to have increased in size in the interval. This nodule  now measures 12 x 16 by 13 mm compared to 8 x 13 x 11 mm  previously. A slow growing malignancy cannot be excluded. PET-CT  may be helpful to assess for metabolic activity. No other lung  nodule is seen. No pleural effusion is noted.  On soft tissue window images, the thyroid gland is unremarkable.  There is a tiny low attenuation nodule in the posterior right lobe  of thyroid of doubtful significance. No mediastinal or hilar  adenopathy is seen. The pulmonary arteries and thoracic aorta  opacify with no significant abnormality noted. The liver is  diffusely low attenuation consistent with fatty infiltration. The  spleen is slightly prominent. No bony abnormality is seen. The  origins of the great vessels are patent.  IMPRESSION:  1. Increase in size of the left lower lobe lung nodule. Consider    PET-CT to assess for metabolic activity.  2. No mediastinal or hilar adenopathy.  3. Diffuse fatty infiltration of the liver. Probable splenomegaly.  Original Report Authenticated By: Joretta Bachelor, M.D.    Impression:   She has a 12 x 16 x 13 mm smoothly marginated nodule in the posterior aspect of the left lower lobe which is increased in size since 05/ 2011. This is most likely a benign tumor but could be a low grade malignant tumor like a carcinoid or a very slowly growing lung cancer. This is in a location that should be amenable to wedge resection. Since it is increasing in size and I think it should come out. I don't think there is any definite benefit to performing a needle biopsy for diagnosis preoperatively. I think it would be best to proceed with a left VATS and wedge resection of this lesion. I discussed the procedure with the patient and her father. I discussed the possibility of needing to perform a small thoracotomy for resection. I discussed the benefits and risks of surgery including but not limited to bleeding, blood transfusion, infection, persistent airleak from the lung, postoperative respiratory difficulty or other organ dysfunction. She understands all this and would like to proceed with surgical treatment.  Plan:  Left VATS, possible thoracotomy for resection of left lower lobe lung mass.

## 2011-10-26 ENCOUNTER — Encounter (HOSPITAL_COMMUNITY): Payer: Self-pay

## 2011-10-26 ENCOUNTER — Encounter (HOSPITAL_COMMUNITY): Payer: Self-pay | Admitting: *Deleted

## 2011-10-26 ENCOUNTER — Encounter (HOSPITAL_COMMUNITY)
Admission: RE | Disposition: A | Discharge status: Home / Self Care | Payer: Self-pay | Source: Ambulatory Visit | Attending: Surgery

## 2011-10-26 ENCOUNTER — Inpatient Hospital Stay (HOSPITAL_COMMUNITY)
Admission: RE | Admit: 2011-10-26 | Discharge: 2011-10-30 | DRG: 165 | Disposition: A | Discharge status: Home / Self Care | Payer: Medicaid Other | Source: Ambulatory Visit | Attending: Surgery | Admitting: Surgery

## 2011-10-26 ENCOUNTER — Ambulatory Visit (HOSPITAL_COMMUNITY): Payer: Medicaid Other

## 2011-10-26 DIAGNOSIS — D381 Neoplasm of uncertain behavior of trachea, bronchus and lung: Secondary | ICD-10-CM

## 2011-10-26 DIAGNOSIS — Z888 Allergy status to other drugs, medicaments and biological substances status: Secondary | ICD-10-CM

## 2011-10-26 DIAGNOSIS — K219 Gastro-esophageal reflux disease without esophagitis: Secondary | ICD-10-CM | POA: Diagnosis present

## 2011-10-26 DIAGNOSIS — R911 Solitary pulmonary nodule: Secondary | ICD-10-CM | POA: Diagnosis present

## 2011-10-26 DIAGNOSIS — Z886 Allergy status to analgesic agent status: Secondary | ICD-10-CM

## 2011-10-26 DIAGNOSIS — F3289 Other specified depressive episodes: Secondary | ICD-10-CM | POA: Diagnosis present

## 2011-10-26 DIAGNOSIS — Z794 Long term (current) use of insulin: Secondary | ICD-10-CM

## 2011-10-26 DIAGNOSIS — Z87891 Personal history of nicotine dependence: Secondary | ICD-10-CM

## 2011-10-26 DIAGNOSIS — F329 Major depressive disorder, single episode, unspecified: Secondary | ICD-10-CM | POA: Diagnosis present

## 2011-10-26 DIAGNOSIS — Z880 Allergy status to penicillin: Secondary | ICD-10-CM

## 2011-10-26 DIAGNOSIS — E119 Type 2 diabetes mellitus without complications: Secondary | ICD-10-CM | POA: Diagnosis present

## 2011-10-26 DIAGNOSIS — D3A090 Benign carcinoid tumor of the bronchus and lung: Principal | ICD-10-CM | POA: Diagnosis present

## 2011-10-26 DIAGNOSIS — F411 Generalized anxiety disorder: Secondary | ICD-10-CM | POA: Diagnosis present

## 2011-10-26 HISTORY — PX: THORACOTOMY: SHX5074

## 2011-10-26 LAB — GLUCOSE, CAPILLARY

## 2011-10-26 SURGERY — VIDEO ASSISTED THORACOSCOPY (VATS)/WEDGE RESECTION
Anesthesia: General | Site: Chest | Laterality: Left | Wound class: Clean Contaminated

## 2011-10-26 MED ORDER — ONDANSETRON HCL 4 MG/2ML IJ SOLN
INTRAMUSCULAR | Status: AC
  Administered 2011-10-27: 4 mg via INTRAVENOUS
  Filled 2011-10-26: qty 2

## 2011-10-26 MED ORDER — LACTATED RINGERS IV SOLN
INTRAVENOUS | Status: DC | PRN
  Administered 2011-10-26: 08:00:00 via INTRAVENOUS

## 2011-10-26 MED ORDER — MOXIFLOXACIN HCL IN NACL 400 MG/250ML IV SOLN
400.0000 mg | INTRAVENOUS | Status: DC
  Administered 2011-10-26 – 2011-10-28 (×3): 400 mg via INTRAVENOUS
  Filled 2011-10-26 (×4): qty 250

## 2011-10-26 MED ORDER — DEXTROSE-NACL 5-0.9 % IV SOLN
INTRAVENOUS | Status: DC
  Administered 2011-10-26: 1000 mL via INTRAVENOUS
  Administered 2011-10-27: 30 mL via INTRAVENOUS
  Administered 2011-10-28: 20 mL/h via INTRAVENOUS

## 2011-10-26 MED ORDER — NEOSTIGMINE METHYLSULFATE 1 MG/ML IJ SOLN
INTRAMUSCULAR | Status: DC | PRN
  Administered 2011-10-26: 5 mg via INTRAVENOUS

## 2011-10-26 MED ORDER — ACETAMINOPHEN 10 MG/ML IV SOLN
1000.0000 mg | Freq: Four times a day (QID) | INTRAVENOUS | Status: AC
  Administered 2011-10-26 – 2011-10-27 (×3): 1000 mg via INTRAVENOUS
  Filled 2011-10-26 (×5): qty 100

## 2011-10-26 MED ORDER — INSULIN ASPART 100 UNIT/ML ~~LOC~~ SOLN
0.0000 [IU] | SUBCUTANEOUS | Status: DC
  Administered 2011-10-26 – 2011-10-27 (×4): 4 [IU] via SUBCUTANEOUS
  Administered 2011-10-27: 2 [IU] via SUBCUTANEOUS
  Administered 2011-10-27: 12 [IU] via SUBCUTANEOUS
  Administered 2011-10-27: 4 [IU] via SUBCUTANEOUS
  Administered 2011-10-27: 8 [IU] via SUBCUTANEOUS
  Administered 2011-10-28: 4 [IU] via SUBCUTANEOUS
  Administered 2011-10-28: 8 [IU] via SUBCUTANEOUS
  Administered 2011-10-28: 2 [IU] via SUBCUTANEOUS
  Administered 2011-10-28: 4 [IU] via SUBCUTANEOUS
  Administered 2011-10-28: 2 [IU] via SUBCUTANEOUS
  Administered 2011-10-29: 12 [IU] via SUBCUTANEOUS
  Administered 2011-10-29 (×2): 8 [IU] via SUBCUTANEOUS
  Administered 2011-10-29: 4 [IU] via SUBCUTANEOUS
  Administered 2011-10-29 – 2011-10-30 (×3): 2 [IU] via SUBCUTANEOUS
  Filled 2011-10-26: qty 3

## 2011-10-26 MED ORDER — DROPERIDOL 2.5 MG/ML IJ SOLN
INTRAMUSCULAR | Status: DC | PRN
  Administered 2011-10-26: 0.625 mg via INTRAVENOUS

## 2011-10-26 MED ORDER — DIPHENHYDRAMINE HCL 50 MG/ML IJ SOLN: 12.5000 mg | Freq: Four times a day (QID) | INTRAMUSCULAR | Status: DC | PRN

## 2011-10-26 MED ORDER — FENTANYL 10 MCG/ML IV SOLN
INTRAVENOUS | Status: DC
  Administered 2011-10-26: 120 ug via INTRAVENOUS
  Administered 2011-10-26: 12:00:00 via INTRAVENOUS
  Administered 2011-10-26: 75 ug via INTRAVENOUS
  Administered 2011-10-27: 120 ug via INTRAVENOUS
  Administered 2011-10-27: 84.29 ug via INTRAVENOUS
  Administered 2011-10-27: 45 ug via INTRAVENOUS
  Administered 2011-10-27: 60 ug via INTRAVENOUS
  Administered 2011-10-27: 06:00:00 via INTRAVENOUS
  Administered 2011-10-27: 45 ug via INTRAVENOUS
  Administered 2011-10-28: 30 ug via INTRAVENOUS
  Administered 2011-10-28: 17 ug via INTRAVENOUS
  Administered 2011-10-28: 45 ug via INTRAVENOUS
  Administered 2011-10-28: 40.99 ug via INTRAVENOUS
  Administered 2011-10-29: 15 ug via INTRAVENOUS
  Filled 2011-10-26 (×2): qty 50

## 2011-10-26 MED ORDER — BUPIVACAINE ON-Q PAIN PUMP (FOR ORDER SET NO CHG)
INJECTION | Status: DC
  Filled 2011-10-26: qty 1

## 2011-10-26 MED ORDER — BUPROPION HCL ER (XL) 150 MG PO TB24
150.0000 mg | ORAL_TABLET | Freq: Every evening | ORAL | Status: DC
  Administered 2011-10-26 – 2011-10-29 (×4): 150 mg via ORAL
  Filled 2011-10-26 (×5): qty 1

## 2011-10-26 MED ORDER — FENTANYL CITRATE 0.05 MG/ML IJ SOLN
INTRAMUSCULAR | Status: DC | PRN
  Administered 2011-10-26 (×2): 50 ug via INTRAVENOUS
  Administered 2011-10-26: 100 ug via INTRAVENOUS
  Administered 2011-10-26 (×3): 50 ug via INTRAVENOUS
  Administered 2011-10-26: 100 ug via INTRAVENOUS
  Administered 2011-10-26: 50 ug via INTRAVENOUS

## 2011-10-26 MED ORDER — MORPHINE SULFATE 2 MG/ML IJ SOLN: 0.0500 mg/kg | INTRAMUSCULAR | Status: DC | PRN

## 2011-10-26 MED ORDER — DIPHENHYDRAMINE HCL 12.5 MG/5ML PO ELIX
12.5000 mg | ORAL_SOLUTION | Freq: Four times a day (QID) | ORAL | Status: DC | PRN
  Filled 2011-10-26: qty 5

## 2011-10-26 MED ORDER — VECURONIUM BROMIDE 10 MG IV SOLR
INTRAVENOUS | Status: DC | PRN
  Administered 2011-10-26: 1 mg via INTRAVENOUS
  Administered 2011-10-26 (×2): 2 mg via INTRAVENOUS

## 2011-10-26 MED ORDER — MORPHINE SULFATE 10 MG/ML IJ SOLN
INTRAMUSCULAR | Status: DC | PRN
  Administered 2011-10-26 (×2): 5 mg via INTRAVENOUS

## 2011-10-26 MED ORDER — LORAZEPAM 0.5 MG PO TABS
0.5000 mg | ORAL_TABLET | Freq: Three times a day (TID) | ORAL | Status: DC | PRN
  Administered 2011-10-27 – 2011-10-30 (×3): 0.5 mg via ORAL
  Filled 2011-10-26 (×6): qty 1

## 2011-10-26 MED ORDER — ONDANSETRON HCL 4 MG/2ML IJ SOLN
4.0000 mg | Freq: Four times a day (QID) | INTRAMUSCULAR | Status: DC | PRN
  Administered 2011-10-26 – 2011-10-27 (×2): 4 mg via INTRAVENOUS
  Filled 2011-10-26 (×3): qty 2

## 2011-10-26 MED ORDER — POTASSIUM CHLORIDE 10 MEQ/50ML IV SOLN: 10.0000 meq | Freq: Every day | INTRAVENOUS | Status: DC | PRN

## 2011-10-26 MED ORDER — MIDAZOLAM HCL 5 MG/5ML IJ SOLN
INTRAMUSCULAR | Status: DC | PRN
  Administered 2011-10-26 (×2): 2 mg via INTRAVENOUS

## 2011-10-26 MED ORDER — NALOXONE HCL 0.4 MG/ML IJ SOLN
0.4000 mg | INTRAMUSCULAR | Status: DC | PRN
  Filled 2011-10-26: qty 1

## 2011-10-26 MED ORDER — ONDANSETRON HCL 4 MG/2ML IJ SOLN
INTRAMUSCULAR | Status: DC | PRN
  Administered 2011-10-26 (×2): 4 mg via INTRAVENOUS

## 2011-10-26 MED ORDER — HEMOSTATIC AGENTS (NO CHARGE) OPTIME
TOPICAL | Status: DC | PRN
  Administered 2011-10-26: 1 via TOPICAL

## 2011-10-26 MED ORDER — SODIUM CHLORIDE 0.9 % IJ SOLN
9.0000 mL | INTRAMUSCULAR | Status: DC | PRN
  Administered 2011-10-26 – 2011-10-27 (×2): 9 mL via INTRAVENOUS

## 2011-10-26 MED ORDER — MEPERIDINE HCL 25 MG/ML IJ SOLN: 6.2500 mg | INTRAMUSCULAR | Status: DC | PRN

## 2011-10-26 MED ORDER — SENNOSIDES-DOCUSATE SODIUM 8.6-50 MG PO TABS
1.0000 | ORAL_TABLET | Freq: Every evening | ORAL | Status: DC | PRN
  Filled 2011-10-26: qty 1

## 2011-10-26 MED ORDER — GLYCOPYRROLATE 0.2 MG/ML IJ SOLN
INTRAMUSCULAR | Status: DC | PRN
  Administered 2011-10-26: .6 mg via INTRAVENOUS

## 2011-10-26 MED ORDER — INSULIN GLARGINE 100 UNIT/ML ~~LOC~~ SOLN
20.0000 [IU] | Freq: Two times a day (BID) | SUBCUTANEOUS | Status: DC
  Administered 2011-10-26: 20 [IU] via SUBCUTANEOUS
  Filled 2011-10-26: qty 3

## 2011-10-26 MED ORDER — ALBUTEROL SULFATE (5 MG/ML) 0.5% IN NEBU
2.5000 mg | INHALATION_SOLUTION | RESPIRATORY_TRACT | Status: DC
  Administered 2011-10-26 – 2011-10-30 (×15): 2.5 mg via RESPIRATORY_TRACT
  Filled 2011-10-26 (×15): qty 0.5

## 2011-10-26 MED ORDER — ONDANSETRON HCL 4 MG/2ML IJ SOLN
4.0000 mg | Freq: Once | INTRAMUSCULAR | Status: AC | PRN
  Administered 2011-10-26: 4 mg via INTRAVENOUS

## 2011-10-26 MED ORDER — ONDANSETRON HCL 4 MG/2ML IJ SOLN
4.0000 mg | Freq: Four times a day (QID) | INTRAMUSCULAR | Status: DC | PRN
  Administered 2011-10-26 – 2011-10-27 (×2): 4 mg via INTRAVENOUS
  Filled 2011-10-26: qty 2

## 2011-10-26 MED ORDER — PROPOFOL 10 MG/ML IV EMUL
INTRAVENOUS | Status: DC | PRN
  Administered 2011-10-26: 200 mg via INTRAVENOUS

## 2011-10-26 MED ORDER — ROCURONIUM BROMIDE 100 MG/10ML IV SOLN
INTRAVENOUS | Status: DC | PRN
  Administered 2011-10-26: 50 mg via INTRAVENOUS

## 2011-10-26 MED ORDER — BISACODYL 5 MG PO TBEC
10.0000 mg | DELAYED_RELEASE_TABLET | Freq: Every day | ORAL | Status: DC
  Administered 2011-10-27 – 2011-10-29 (×3): 10 mg via ORAL
  Filled 2011-10-26 (×2): qty 2
  Filled 2011-10-26 (×2): qty 1

## 2011-10-26 MED ORDER — BUPIVACAINE 0.25 % ON-Q PUMP SINGLE CATH 400 ML
400.0000 mL | INJECTION | Status: DC
  Filled 2011-10-26: qty 400

## 2011-10-26 MED ORDER — PANTOPRAZOLE SODIUM 40 MG PO TBEC
40.0000 mg | DELAYED_RELEASE_TABLET | Freq: Every day | ORAL | Status: DC
  Administered 2011-10-26 – 2011-10-29 (×4): 40 mg via ORAL
  Filled 2011-10-26 (×5): qty 1

## 2011-10-26 MED ORDER — HYDROMORPHONE HCL PF 1 MG/ML IJ SOLN
0.2500 mg | INTRAMUSCULAR | Status: DC | PRN
  Administered 2011-10-26: 1 mg via INTRAVENOUS
  Administered 2011-10-26 (×2): 0.5 mg via INTRAVENOUS

## 2011-10-26 MED ORDER — 0.9 % SODIUM CHLORIDE (POUR BTL) OPTIME
TOPICAL | Status: DC | PRN
  Administered 2011-10-26: 1000 mL

## 2011-10-26 SURGICAL SUPPLY — 64 items
APL SRG 22X2 LUM MLBL SLNT (VASCULAR PRODUCTS)
APL SRG 7X2 LUM MLBL SLNT (VASCULAR PRODUCTS)
APPLICATOR TIP COSEAL (VASCULAR PRODUCTS) IMPLANT
APPLICATOR TIP EXT COSEAL (VASCULAR PRODUCTS) IMPLANT
CANISTER SUCTION 2500CC (MISCELLANEOUS) ×2 IMPLANT
CATH KIT ON Q 5IN SLV (PAIN MANAGEMENT) ×1 IMPLANT
CATH THORACIC 28FR (CATHETERS) IMPLANT
CATH THORACIC 36FR (CATHETERS) IMPLANT
CATH THORACIC 36FR RT ANG (CATHETERS) IMPLANT
CLIP TI MEDIUM 6 (CLIP) ×1 IMPLANT
CLOTH BEACON ORANGE TIMEOUT ST (SAFETY) ×2 IMPLANT
CONT SPEC 4OZ CLIKSEAL STRL BL (MISCELLANEOUS) ×4 IMPLANT
DRAPE LAPAROSCOPIC ABDOMINAL (DRAPES) ×2 IMPLANT
DRAPE SLUSH MACHINE 52X66 (DRAPES) IMPLANT
DRAPE SLUSH/WARMER DISC (DRAPES) IMPLANT
ELECT REM PT RETURN 9FT ADLT (ELECTROSURGICAL) ×2
ELECTRODE REM PT RTRN 9FT ADLT (ELECTROSURGICAL) ×1 IMPLANT
GAUZE SPONGE 4X4 12PLY STRL LF (GAUZE/BANDAGES/DRESSINGS) ×1 IMPLANT
GLOVE BIO SURGEON STRL SZ 6.5 (GLOVE) ×1 IMPLANT
GLOVE BIOGEL PI IND STRL 6.5 (GLOVE) IMPLANT
GLOVE BIOGEL PI INDICATOR 6.5 (GLOVE) ×2
GLOVE EUDERMIC 7 POWDERFREE (GLOVE) ×4 IMPLANT
GLOVE SURG SS PI 7.5 STRL IVOR (GLOVE) ×1 IMPLANT
GOWN PREVENTION PLUS XLARGE (GOWN DISPOSABLE) ×3 IMPLANT
GOWN STRL NON-REIN LRG LVL3 (GOWN DISPOSABLE) ×6 IMPLANT
HANDLE STAPLE ENDO GIA SHORT (STAPLE) ×1
KIT BASIN OR (CUSTOM PROCEDURE TRAY) ×2 IMPLANT
KIT ROOM TURNOVER OR (KITS) ×2 IMPLANT
KIT SUCTION CATH 14FR (SUCTIONS) ×2 IMPLANT
NS IRRIG 1000ML POUR BTL (IV SOLUTION) ×4 IMPLANT
PACK CHEST (CUSTOM PROCEDURE TRAY) ×2 IMPLANT
PAD ARMBOARD 7.5X6 YLW CONV (MISCELLANEOUS) ×4 IMPLANT
RELOAD EGIA 60 MED/THCK PURPLE (STAPLE) ×8 IMPLANT
RELOAD STAPLE 60 MED/THCK ART (STAPLE) IMPLANT
SEALANT PROGEL (MISCELLANEOUS) ×1 IMPLANT
SEALANT SURG COSEAL 4ML (VASCULAR PRODUCTS) IMPLANT
SEALANT SURG COSEAL 8ML (VASCULAR PRODUCTS) IMPLANT
SOLUTION ANTI FOG 6CC (MISCELLANEOUS) ×1 IMPLANT
SPONGE GAUZE 4X4 12PLY (GAUZE/BANDAGES/DRESSINGS) ×2 IMPLANT
STAPLER ENDO GIA 12 SHRT THIN (STAPLE) IMPLANT
STAPLER ENDO GIA 12MM SHORT (STAPLE) ×1 IMPLANT
SUT PROLENE 3 0 SH DA (SUTURE) ×1 IMPLANT
SUT PROLENE 4 0 RB 1 (SUTURE)
SUT PROLENE 4-0 RB1 .5 CRCL 36 (SUTURE) IMPLANT
SUT SILK  1 MH (SUTURE) ×2
SUT SILK 1 MH (SUTURE) ×2 IMPLANT
SUT SILK 2 0SH CR/8 30 (SUTURE) ×1 IMPLANT
SUT SILK 3 0SH CR/8 30 (SUTURE) ×1 IMPLANT
SUT VIC AB 1 CTX 36 (SUTURE) ×4
SUT VIC AB 1 CTX36XBRD ANBCTR (SUTURE) IMPLANT
SUT VIC AB 2-0 CTX 36 (SUTURE) ×1 IMPLANT
SUT VIC AB 3-0 X1 27 (SUTURE) ×2 IMPLANT
SUT VICRYL 2 TP 1 (SUTURE) IMPLANT
SWAB COLLECTION DEVICE MRSA (MISCELLANEOUS) IMPLANT
SYSTEM SAHARA CHEST DRAIN ATS (WOUND CARE) ×2 IMPLANT
TAPE CLOTH SURG 4X10 WHT LF (GAUZE/BANDAGES/DRESSINGS) ×1 IMPLANT
TIP APPLICATOR SPRAY EXTEND 16 (VASCULAR PRODUCTS) ×1 IMPLANT
TOWEL OR 17X24 6PK STRL BLUE (TOWEL DISPOSABLE) ×3 IMPLANT
TOWEL OR 17X26 10 PK STRL BLUE (TOWEL DISPOSABLE) ×3 IMPLANT
TRAP SPECIMEN MUCOUS 40CC (MISCELLANEOUS) IMPLANT
TRAY FOLEY CATH 14FRSI W/METER (CATHETERS) ×2 IMPLANT
TUBE ANAEROBIC SPECIMEN COL (MISCELLANEOUS) IMPLANT
TUNNELER SHEATH ON-Q 11GX8 (MISCELLANEOUS) ×1 IMPLANT
WATER STERILE IRR 1000ML POUR (IV SOLUTION) ×4 IMPLANT

## 2011-10-26 NOTE — Interval H&P Note (Signed)
History and Physical Interval Note:  10/26/2011 7:27 AM  Kimberly [name]  has presented today for surgery, with the diagnosis of lung mass  The various methods of treatment have been discussed with the patient and family. After consideration of risks, benefits and other options for treatment, the patient has consented to  Procedure(s) (LRB): VIDEO ASSISTED THORACOSCOPY (VATS)/WEDGE RESECTION (Left) as a surgical intervention .  The patients' history has been reviewed, patient examined, no change in status, stable for surgery.  I have reviewed the patients' chart and labs.  Questions were answered to the patient's satisfaction.     Gaye Pollack

## 2011-10-26 NOTE — Anesthesia Preprocedure Evaluation (Addendum)
Anesthesia Evaluation  Patient identified by MRN, date of birth, ID band Patient awake    Reviewed: Allergy & Precautions, H&P , NPO status , Patient's Chart, lab work & pertinent test results  History of Anesthesia Complications (+) PONV  Airway Mallampati: I TM Distance: >3 FB Neck ROM: Full    Dental  (+) Teeth Intact and Dental Advisory Given   Pulmonary  clear to auscultation        Cardiovascular hypertension, Pt. on medications Regular Normal    Neuro/Psych  Headaches, Anxiety Depression    GI/Hepatic GERD-  ,  Endo/Other  Diabetes mellitus-, Type 2, Insulin DependentMorbid obesity  Renal/GU      Musculoskeletal   Abdominal   Peds  Hematology   Anesthesia Other Findings   Reproductive/Obstetrics                         Anesthesia Physical Anesthesia Plan  ASA: III  Anesthesia Plan: General   Post-op Pain Management:    Induction: Intravenous  Airway Management Planned: Oral ETT and Double Lumen EBT  Additional Equipment: Arterial line and CVP  Intra-op Plan:   Post-operative Plan: Extubation in OR  Informed Consent: I have reviewed the patients History and Physical, chart, labs and discussed the procedure including the risks, benefits and alternatives for the proposed anesthesia with the patient or authorized representative who has indicated his/her understanding and acceptance.   Dental advisory given  Plan Discussed with: CRNA and Anesthesiologist  Anesthesia Plan Comments:        Anesthesia Quick Evaluation

## 2011-10-26 NOTE — Anesthesia Postprocedure Evaluation (Signed)
  Anesthesia Post-op Note  Patient: Kimberly [name]  Procedure(s) Performed: Procedure(s) (LRB): VIDEO ASSISTED THORACOSCOPY (VATS)/WEDGE RESECTION (Left) THORACOTOMY MAJOR (Left)  Patient Location: PACU  Anesthesia Type: General  Level of Consciousness: awake, alert  and oriented  Airway and Oxygen Therapy: Patient Spontanous Breathing and Patient connected to nasal cannula oxygen  Post-op Pain: moderate  Post-op Assessment: Post-op Vital signs reviewed, Patient's Cardiovascular Status Stable, Respiratory Function Stable, No signs of Nausea or vomiting and Pain level controlled  Post-op Vital Signs: Reviewed and stable  Complications: No apparent anesthesia complications

## 2011-10-26 NOTE — Progress Notes (Signed)
CXR report reviewed, no pneumo noted

## 2011-10-26 NOTE — Anesthesia Procedure Notes (Signed)
Procedure Name: Intubation Date/Time: 10/26/2011 8:06 AM Performed by: Lucious Groves Pre-anesthesia Checklist: Patient identified, Emergency Drugs available, Suction available and Patient being monitored Patient Re-evaluated:Patient Re-evaluated prior to inductionOxygen Delivery Method: Circle system utilized Preoxygenation: Pre-oxygenation with 100% oxygen Intubation Type: IV induction Ventilation: Mask ventilation without difficulty Laryngoscope Size: Mac and 3 Grade View: Grade II Tube type: Oral Endobronchial tube: Double lumen EBT and 39 Fr Number of attempts: 1 Airway Equipment and Method: Stylet Placement Confirmation: ETT inserted through vocal cords under direct vision,  breath sounds checked- equal and bilateral and positive ETCO2 Tube secured with: Tape Comments: Placement of DLT with fiberoptic scope my MDA

## 2011-10-26 NOTE — Progress Notes (Signed)
UR Completed.  Kimberly [name] [phone] 10/26/2011

## 2011-10-26 NOTE — Transfer of Care (Signed)
Immediate Anesthesia Transfer of Care Note  Patient: Kimberly [name]  Procedure(s) Performed: Procedure(s) (LRB): VIDEO ASSISTED THORACOSCOPY (VATS)/WEDGE RESECTION (Left) THORACOTOMY MAJOR (Left)  Patient Location: PACU  Anesthesia Type: General  Level of Consciousness: awake and alert   Airway & Oxygen Therapy: Patient Spontanous Breathing and Patient connected to face mask oxygen  Post-op Assessment: Report given to PACU RN and Post -op Vital signs reviewed and stable  Post vital signs: Reviewed and stable  Complications: No apparent anesthesia complications

## 2011-10-26 NOTE — Brief Op Note (Signed)
10/26/2011  10:01 AM  PATIENT:  [name]  51 y.o. female  PRE-OPERATIVE DIAGNOSIS:  Left Lower Lobe Lung Mass  POST-OPERATIVE DIAGNOSIS:  Left Lower Lobe Lung Mass  PROCEDURE:  Procedure(s) (LRB): VIDEO ASSISTED THORACOSCOPY (VATS)/WEDGE RESECTION (Left lower lobe lung nodule) THORACOTOMY MAJOR (Left)  SURGEON:  Surgeon(s) and Role:    * Gaye Pollack, MD - Primary  PHYSICIAN ASSISTANT: none   ANESTHESIA:   general  EBL:  Total I/O In: 1200 [I.V.:1200] Out: 100 [Urine:50; Blood:50]  BLOOD ADMINISTERED:none  DRAINS: 1 Chest Tube(s) in the left   LOCAL MEDICATIONS USED: On-Q placed  SPECIMEN:  Left lower lobe wedge  DISPOSITION OF SPECIMEN:  PATHOLOGY  Frozen section shows possible carcinoid.  No carcinoma.  Margin negative COUNTS:  YES   PATIENT DISPOSITION:  PACU - hemodynamically stable.

## 2011-10-27 ENCOUNTER — Inpatient Hospital Stay (HOSPITAL_COMMUNITY): Payer: Medicaid Other

## 2011-10-27 LAB — GLUCOSE, CAPILLARY
Glucose-Capillary: 182 mg/dL — ABNORMAL HIGH (ref 70–99)
Glucose-Capillary: 187 mg/dL — ABNORMAL HIGH (ref 70–99)
Glucose-Capillary: 236 mg/dL — ABNORMAL HIGH (ref 70–99)
Glucose-Capillary: 287 mg/dL — ABNORMAL HIGH (ref 70–99)

## 2011-10-27 LAB — BLOOD GAS, ARTERIAL
Acid-Base Excess: 2 mmol/L (ref 0.0–2.0)
Drawn by: 23588
TCO2: 27.9 mmol/L (ref 0–100)
pCO2 arterial: 45 mmHg (ref 35.0–45.0)
pO2, Arterial: 181 mmHg — ABNORMAL HIGH (ref 80.0–100.0)

## 2011-10-27 LAB — CBC
Hemoglobin: 11.8 g/dL — ABNORMAL LOW (ref 12.0–15.0)
MCH: 28.2 pg (ref 26.0–34.0)
MCV: 84.7 fL (ref 78.0–100.0)
RBC: 4.18 MIL/uL (ref 3.87–5.11)

## 2011-10-27 LAB — BASIC METABOLIC PANEL
BUN: 9 mg/dL (ref 6–23)
Chloride: 103 mEq/L (ref 96–112)
GFR calc Af Amer: >90 mL/min (ref >90)
Potassium: 3.6 mEq/L (ref 3.5–5.1)

## 2011-10-27 MED ORDER — SODIUM CHLORIDE 0.9 % IJ SOLN
INTRAMUSCULAR | Status: AC
  Administered 2011-10-27: 9 mL via INTRAVENOUS
  Filled 2011-10-27: qty 20

## 2011-10-27 MED ORDER — LACTULOSE 10 GM/15ML PO SOLN
30.0000 g | Freq: Every day | ORAL | Status: AC
  Filled 2011-10-27: qty 45

## 2011-10-27 MED ORDER — INSULIN GLARGINE 100 UNIT/ML ~~LOC~~ SOLN
30.0000 [IU] | Freq: Two times a day (BID) | SUBCUTANEOUS | Status: DC
  Administered 2011-10-27 (×2): 30 [IU] via SUBCUTANEOUS

## 2011-10-27 NOTE — Op Note (Signed)
NAMEDOMINGUE, COLTRAIN               ACCOUNT NO.:  [id number]  MEDICAL RECORD NO.:  [id number]  LOCATION:  1700                         FACILITY:  [facility]  PHYSICIAN:  Gilford Raid, M.D.     DATE OF BIRTH:  08-May-1961  DATE OF PROCEDURE:  10/26/2011 DATE OF DISCHARGE:                              OPERATIVE REPORT   PREOPERATIVE DIAGNOSIS:  Left lower lobe lung nodule.  POSTOPERATIVE DIAGNOSIS:  Left lower lobe lung nodule.  PROCEDURE:  Left video-assisted thoracoscopy, left mini thoracotomy with wedge resection of left lower lobe lung mass.  ATTENDING SURGEON:  Gilford Raid, MD  ANESTHESIA:  General endotracheal.  CLINICAL HISTORY:  This patient is a 51 year old woman with a remote smoking history, who had some abdominal pain back in May 2011 for which a CT scan of the abdomen was performed.  This showed an incidental 8 x 13 x 11 mm left lower lobe lung nodule at the base of the left lung posteriorly.  She had a CT scan of the chest to confirm this nodule and it showed no other abnormality.  It was decided this small nodule should be followed and a repeat CT scan of the chest was done on August 30, 2011.  This showed the nodule had increased in size compared with the previous scan and now measured 12 x 16 x 13 mm.  There were no other lung lesions seen.  There was no mediastinal or hilar adenopathy.  After review of the scans and examination of the patient, I felt that surgical resection was the best option since this could be a malignant lesion or a low-grade malignant lesion like carcinoid tumor.  It was slowly enlarging in size.  I discussed the operative procedure with the patient including alternatives, benefits, and risks including, but not limited to, bleeding, prolonged air leak from the lung, and infection.  She understood all this and agreed to proceed.  OPERATIVE PROCEDURE:  The patient was seen in the preoperative holding area and the proper patient, proper operation,  proper operative side were confirmed with the patient after reviewing the x-rays.  The left side of the chest was signed by me.  She was taken back to the operating room and placed on table in supine position.  After induction of general endotracheal anesthesia, a Foley catheter was placed in bladder using sterile technique.  Pneumatic compression devices were placed on the legs.  Preoperative intravenous antibiotics were given.  The patient was then turned into the right lateral decubitus position with the left side up.  Left side of the chest was prepped with Betadine soap and solution and draped in the usual sterile manner.  Then, a time-out was taken. Proper patient, proper operation, proper operative side were confirmed with nursing and Anesthesia staff after reviewing the x-rays in the room again.  Then, a 1-cm incision was made in the midaxillary line at about the 8th intercostal space.  Blunt dissection was continued down to the pleural space and the pleural space was entered bluntly through the 8th intercostal space.  The patient had very thick chest wall, and a standard 8-mm trocar just barely was long enough to get inside the pleural  space.  We did not have any longer trocars was available.  The 30-degree thoracoscope was inserted.  Examination of pleural space showed no abnormality.  The lung nodule was not visible on the surface of the lung.  I tried to palpate the lung with a spatula but could not definitely locate this nodule.  It was somewhat difficult exposure due to her thick chest wall.  I felt the safest thing to do was to perform a small right lateral thoracotomy incision, and therefore the thoracoscope was removed and a small right lateral thoracotomy incision was made. The chest wall muscles were divided over a short distance using electrocautery.  The pleural space was entered through the 8th intercostal space.  Rib retractor was placed.  The inferior  pulmonary ligament was divided to allow the lung to come up towards the incision. Then, I was able to palpate the inferior surface of the lung and could palpate the lung nodule.  Then, a wedge resection was performed to completely excise this lung nodule.  That specimen was sent to pathology and the pathologist called for the frozen section showed no evidence of carcinoma but favored a possible carcinoid tumor.  The margin was negative.  I did not feel any further resection was indicated.  The staple line was coated with ProGEL sealing air leaks.  Then, an On-Q pain catheter was inserted.  The introducer and trocar were inserted through a separate stab incision and then tunneled in a subpleural plane from inferior to the incision around posteriorly to the incision and up about 2 interspaces above the incision.  The introducer was removed and the catheter inserted through the peel-away sheath which was then removed.  The catheter was fixed to the skin with silk suture.  It was connected to the pain ball which had 0.25% Marcaine solution.  Then, a 28-French chest tube was brought in through a separate stab incision and positioned in the left pleural space posteriorly up to the apex.  The ribs were then reapproximated with #2 Vicryl pericostal sutures.  Before tying the sutures, the left lung was reinflated, and there were no visible air leaks.  The sutures were then tied.  The muscles were reapproximated with #1 Vicryl continuous suture and the subcutaneous tissue with continuous 2-0 Vicryl suture.  The skin was closed with a 3- 0 Vicryl subcuticular skin closure.  The sponge, needle, instrument counts were correct according to the scrub nurse.  Dry sterile dressing applied over the incision and around the chest tube which was hooked to Pleur-Evac suction.  The patient was then turned in the supine position, awakened, extubated, and transported to the [facility]  in satisfactory stable condition.     Gilford Raid, M.D.     BB/MEDQ  D:  10/26/2011  T:  10/27/2011  Job:  [id number]

## 2011-10-27 NOTE — Progress Notes (Addendum)
[address]            [place] [address]          [phone]     1 Day Post-Op Procedure(s) (LRB): VIDEO ASSISTED THORACOSCOPY (VATS)/WEDGE RESECTION (Left) THORACOTOMY MAJOR (Left)  Subjective: Feels rough this morning.  Having a lot of pain since getting OOB to chair.  Very nauseated overnight, but Zofran helps.  Breathing stable.  Objective: Vital signs in last 24 hours: Patient Vitals for the past 24 hrs:  BP Temp Temp src Pulse Resp SpO2 Height Weight  10/27/11 0620 - - - - 15  100 % - -  10/27/11 0424 - - - - 16  99 % - -  10/27/11 0423 118/76 mmHg 97.5 F (36.4 C) Oral 66  16  100 % - -  10/27/11 0402 - - - - - 100 % - -  10/27/11 0000 - - - 61  14  100 % - -  10/26/11 2333 125/61 mmHg 97.8 F (36.6 C) Oral 74  17  99 % - -  10/26/11 2300 - - - 62  14  100 % - -  10/26/11 2228 - - - - - 100 % - -  10/26/11 2000 127/65 mmHg 97.6 F (36.4 C) Oral 63  15  99 % - -  10/26/11 1929 - - - - 18  99 % - -  10/26/11 1700 155/88 mmHg - - 66  11  99 % - -  10/26/11 1632 - - - - - 99 % - -  10/26/11 1600 128/77 mmHg - - 67  11  98 % - -  10/26/11 1530 - - Oral - - - 5' 10"  (1.778 m) 127.4 kg (280 lb 13.9 oz)  10/26/11 1500 - 98.3 F (36.8 C) Oral - - - - -  10/26/11 1200 125/76 mmHg 98.2 F (36.8 C) - 94  14  97 % - -  10/26/11 1145 146/72 mmHg - - 83  14  98 % - -  10/26/11 1130 135/75 mmHg - - 91  16  99 % - -  10/26/11 1115 152/69 mmHg - - 82  11  95 % - -  10/26/11 1100 141/72 mmHg - - 86  16  89 % - -  10/26/11 1045 161/85 mmHg - - 88  13  100 % - -  10/26/11 1030 145/74 mmHg - - 85  18  99 % - -  10/26/11 1015 - 97.6 F (36.4 C) - 79  14  100 % - -   Current Weight  10/26/11 127.4 kg (280 lb 13.9 oz)     Intake/Output from previous day: 03/01 0701 - 03/02 0700 In: 3245 [P.O.:720; I.V.:2525] Out: 1475 [Urine:1025; Blood:50; Chest Tube:400]  CBGs 183-186-182-177-179  PHYSICAL EXAM:  Heart: RRR Lungs: decreased BS  left base Wound: dressed and dry Chest tube: no air leak  Lab Results: CBC: Basename 10/27/11 0430 10/24/11 1354  WBC 5.5 4.7  HGB 11.8* 13.3  HCT 35.4* 39.2  PLT 156 183   BMET:  Basename 10/27/11 0430 10/24/11 1354  NA 138 141  K 3.6 3.9  CL 103 105  CO2 26 23  GLUCOSE 179* 126*  BUN 9 10  CREATININE 0.58 0.58  CALCIUM 9.0 9.8    PT/INR:  Basename 10/24/11 1354  LABPROT 13.8  INR 1.04  CXR stable, no ptx  Assessment/Plan: S/P Procedure(s) (LRB): VIDEO ASSISTED THORACOSCOPY (VATS)/WEDGE RESECTION (Left) THORACOTOMY MAJOR (Left) Chest tube with no air leak, 400 cc out overnight.  Continue CT for now.  Possibly decrease suction. GI- nausea. Continue prn Zofran, advance diet slowly. Mobilize, work on [employer]. BPs elevated this am, probably due to pain, nausea.  Monitor.  No h/o HTN. DM- sugars ok on Lantus.  Will increase and add home Novolog once po intake improves.   LOS: 1 day    COLLINS,GINA H 10/27/2011   patient examined and medical record reviewed,agree with above note. VAN TRIGT III,Ashla Murph 10/27/2011

## 2011-10-28 ENCOUNTER — Inpatient Hospital Stay (HOSPITAL_COMMUNITY): Payer: Medicaid Other

## 2011-10-28 LAB — CBC
Hemoglobin: 11.5 g/dL — ABNORMAL LOW (ref 12.0–15.0)
MCH: 28.3 pg (ref 26.0–34.0)
Platelets: 144 10*3/uL — ABNORMAL LOW (ref 150–400)
RBC: 4.07 MIL/uL (ref 3.87–5.11)
WBC: 4.7 10*3/uL (ref 4.0–10.5)

## 2011-10-28 LAB — COMPREHENSIVE METABOLIC PANEL
ALT: 25 U/L (ref 0–35)
AST: 17 U/L (ref 0–37)
Alkaline Phosphatase: 82 U/L (ref 39–117)
CO2: 30 mEq/L (ref 19–32)
Calcium: 9.1 mg/dL (ref 8.4–10.5)
GFR calc Af Amer: >90 mL/min (ref >90)
GFR calc non Af Amer: >90 mL/min (ref >90)
Glucose, Bld: 166 mg/dL — ABNORMAL HIGH (ref 70–99)
Potassium: 3.6 mEq/L (ref 3.5–5.1)
Sodium: 141 mEq/L (ref 135–145)

## 2011-10-28 LAB — GLUCOSE, CAPILLARY
Glucose-Capillary: 118 mg/dL — ABNORMAL HIGH (ref 70–99)
Glucose-Capillary: 130 mg/dL — ABNORMAL HIGH (ref 70–99)
Glucose-Capillary: 154 mg/dL — ABNORMAL HIGH (ref 70–99)
Glucose-Capillary: 193 mg/dL — ABNORMAL HIGH (ref 70–99)
Glucose-Capillary: 218 mg/dL — ABNORMAL HIGH (ref 70–99)

## 2011-10-28 MED ORDER — SODIUM CHLORIDE 0.9 % IJ SOLN
INTRAMUSCULAR | Status: AC
  Filled 2011-10-28: qty 10

## 2011-10-28 MED ORDER — SODIUM CHLORIDE 0.9 % IJ SOLN
INTRAMUSCULAR | Status: AC
  Administered 2011-10-28: 12:00:00
  Filled 2011-10-28: qty 10

## 2011-10-28 MED ORDER — INSULIN GLARGINE 100 UNIT/ML ~~LOC~~ SOLN
40.0000 [IU] | Freq: Two times a day (BID) | SUBCUTANEOUS | Status: DC
  Administered 2011-10-28 – 2011-10-29 (×4): 40 [IU] via SUBCUTANEOUS
  Filled 2011-10-28: qty 3

## 2011-10-28 NOTE — Progress Notes (Signed)
                    [address]            [place] [address]          [phone]     2 Days Post-Op Procedure(s) (LRB): VIDEO ASSISTED THORACOSCOPY (VATS)/WEDGE RESECTION (Left) THORACOTOMY MAJOR (Left)  Subjective: Feels much better today.  Still with no appetite, but nausea better.  Wants to get out and walk.   Objective: Vital signs in last 24 hours: Patient Vitals for the past 24 hrs:  BP Temp Temp src Pulse Resp SpO2  10/28/11 0300 129/62 mmHg 98.2 F (36.8 C) Oral 71  16  99 %  10/27/11 2300 137/61 mmHg 98.7 F (37.1 C) Oral 71  17  100 %  10/27/11 2055 - - - 80  18  98 %  10/27/11 1922 124/54 mmHg 98.4 F (36.9 C) Oral 83  17  100 %  10/27/11 1608 116/64 mmHg 98.4 F (36.9 C) Oral - - -  10/27/11 1551 - - - - 16  98 %  10/27/11 1550 - - - - 16  98 %  10/27/11 1304 - - - - - 100 %  10/27/11 1200 127/66 mmHg 98.1 F (36.7 C) Oral 67  18  96 %  10/27/11 0823 - - - - - 98 %  10/27/11 0800 - - - - 16  98 %   Current Weight  10/26/11 127.4 kg (280 lb 13.9 oz)     Intake/Output from previous day: 03/02 0701 - 03/03 0700 In: 1680 [P.O.:1320; I.V.:360] Out: 2650 [Urine:2650]  CBGs 160-236-154-130-166  PHYSICAL EXAM:  Heart: RRR Lungs: clear Wound: clean and dry Chest tube: no air leak  Lab Results: CBC: Basename 10/28/11 0630 10/27/11 0430  WBC 4.7 5.5  HGB 11.5* 11.8*  HCT 35.2* 35.4*  PLT 144* 156   BMET:  Basename 10/28/11 0630 10/27/11 0430  NA 141 138  K 3.6 3.6  CL 104 103  CO2 30 26  GLUCOSE 166* 179*  BUN 6 9  CREATININE 0.55 0.58  CALCIUM 9.1 9.0    PT/INR: No results found for this basename: LABPROT,INR in the last 72 hours  CXR stable tiny L apical ptx  Assessment/Plan: S/P Procedure(s) (LRB): VIDEO ASSISTED THORACOSCOPY (VATS)/WEDGE RESECTION (Left) THORACOTOMY MAJOR (Left) Hopefully can d/c chest tube today. Will d/c Foley, decrease IVF for now. BPs better since pain better controlled. DM- sugars  trending up.  Will titrate Lantus and resume Novolog as po intake improves. Possibly home in next few days, depending on when CT comes out and progress.   LOS: 2 days    Kimberly [name] 10/28/2011

## 2011-10-29 ENCOUNTER — Inpatient Hospital Stay (HOSPITAL_COMMUNITY): Payer: Medicaid Other

## 2011-10-29 DIAGNOSIS — R911 Solitary pulmonary nodule: Secondary | ICD-10-CM | POA: Diagnosis present

## 2011-10-29 HISTORY — DX: Solitary pulmonary nodule: R91.1

## 2011-10-29 LAB — GLUCOSE, CAPILLARY
Glucose-Capillary: 115 mg/dL — ABNORMAL HIGH (ref 70–99)
Glucose-Capillary: 136 mg/dL — ABNORMAL HIGH (ref 70–99)
Glucose-Capillary: 162 mg/dL — ABNORMAL HIGH (ref 70–99)
Glucose-Capillary: 240 mg/dL — ABNORMAL HIGH (ref 70–99)
Glucose-Capillary: 244 mg/dL — ABNORMAL HIGH (ref 70–99)

## 2011-10-29 MED ORDER — MOXIFLOXACIN HCL 400 MG PO TABS
400.0000 mg | ORAL_TABLET | Freq: Every day | ORAL | Status: DC
  Administered 2011-10-29: 400 mg via ORAL
  Filled 2011-10-29 (×2): qty 1

## 2011-10-29 MED ORDER — ACETAMINOPHEN 325 MG PO TABS
650.0000 mg | ORAL_TABLET | ORAL | Status: DC | PRN
  Administered 2011-10-29 – 2011-10-30 (×2): 650 mg via ORAL
  Filled 2011-10-29: qty 2
  Filled 2011-10-29: qty 1
  Filled 2011-10-29: qty 2

## 2011-10-29 NOTE — Discharge Instructions (Signed)
Thoracotomy Care After Refer to this sheet in the next few weeks. These instructions provide you with information on caring for yourself after your procedure. Your caregiver may also give you more specific instructions. Your treatment has been planned according to current medical practices, but problems sometimes occur. Call your caregiver if you have any problems or questions after your procedure. HOME CARE INSTRUCTIONS  Remove the bandage (dressing) over your chest tube site as directed by your caregiver.   It is normal to be sore for a couple weeks following surgery. See your caregiver if this seems to be getting worse rather than better.   Only take over-the-counter or prescription medicines for pain, discomfort, or fever as directed by your caregiver. It is very important to take pain medicine when you need it so that you will cough and breathe deeply enough to clear mucus (phlegm) and expand your lungs. This helps prevent a lung infection (pneumonia).   If it hurts to cough, hold a pillow against your chest when you cough. This may help with the discomfort. In spite of the discomfort, cough frequently.   Taking deep breaths keeps lungs inflated and protects against pneumonia. Most patients will go home with a device called an incentive spirometer that encourages deep breathing.   You may resume a normal diet and activities as directed.   Use showers for bathing until you see your caregiver, or as instructed.   Change dressings if necessary or as directed.   Avoid lifting or driving until you are instructed otherwise.   Make an appointment to see your caregiver for stitch (suture) or staple removal when instructed.   Do not travel by airplane for 2 weeks after the chest tube is removed.  SEEK MEDICAL CARE IF:  You are bleeding from your wounds.   Your heartbeat seems irregular.   You have redness, swelling, or increasing pain in the wounds.   There is pus coming from your  wounds.   There is a bad smell coming from the wound or dressing.  SEEK IMMEDIATE MEDICAL CARE IF:  You have a fever.   You develop a rash.   You have difficulty breathing.   You develop any reaction or side effects to medicines given.   You develop lightheadedness or feel faint.   You develop shortness of breath or chest pain.  MAKE SURE YOU:  Understand these instructions.   Will watch your condition.   Will get help right away if you are not doing well or get worse.  Document Released: 01/26/2011 Document Revised: 08/02/2011 Document Reviewed: 01/26/2011 [facility] Patient Information 2012 [place].

## 2011-10-29 NOTE — Progress Notes (Addendum)
3 Days Post-Op Procedure(s) (LRB): VIDEO ASSISTED THORACOSCOPY (VATS)/WEDGE RESECTION (Left) THORACOTOMY MAJOR (Left)  Subjective: Patient with some nausea,incisional pain, and not feeling all that well this am.Denies abdominal pain or emesis.Is passing flatus but no bm yet.  Objective: Vital signs in last 24 hours: Patient Vitals for the past 24 hrs:  BP Temp Temp src Pulse Resp SpO2  10/29/11 0812 - - - - - 95 %  10/29/11 0400 130/65 mmHg 98.4 F (36.9 C) Oral 80  20  98 %  10/29/11 0000 132/56 mmHg 98.7 F (37.1 C) Oral 80  22  95 %  10/28/11 1959 159/51 mmHg 98.4 F (36.9 C) Oral 81  19  96 %  10/28/11 1626 - - - - - 99 %  10/28/11 1622 130/59 mmHg 98.4 F (36.9 C) Oral 93  - 96 %  10/28/11 1600 - - - - 18  98 %  10/28/11 1222 - - - - - 100 %  10/28/11 1154 137/65 mmHg 98.7 F (37.1 C) Oral 75  22  95 %  10/28/11 1153 - - - - 20  98 %  10/28/11 0845 119/66 mmHg 98.1 F (36.7 C) Oral 77  19  99 %    Current Weight  10/26/11 280 lb 13.9 oz (127.4 kg)      Intake/Output from previous day: 03/03 0701 - 03/04 0700 In: 1330 [P.O.:1080; I.V.:250] Out: 1376 [Urine:726; Chest Tube:650]   Physical Exam:  Cardiovascular: RRR, no murmurs, gallops, or rubs. Pulmonary: Clear on right and slightly decreased at left base ; no rales, wheezes, or rhonchi. Abdomen: Soft, non tender, bowel sounds present. Extremities:No lower extremity edema. Wounds: Dressing clean and dry.   Lab Results: CBC: Basename 10/28/11 0630 10/27/11 0430  WBC 4.7 5.5  HGB 11.5* 11.8*  HCT 35.2* 35.4*  PLT 144* 156   BMET:  Basename 10/28/11 0630 10/27/11 0430  NA 141 138  K 3.6 3.6  CL 104 103  CO2 30 26  GLUCOSE 166* 179*  BUN 6 9  CREATININE 0.55 0.58  CALCIUM 9.1 9.0     Assessment/Plan:  1.  Pulmonary - Encourage incentive spirometer.CXR this am shows trace left apical pneumothorax, left base atelectasis and small pleural effusion, and right lung is clear. 2.DM-CBGs  240/136/115.Continue Insulin. 3.Stop PCA and On Q. 4.Zofran PRN nausea 5.Possible discharge in am.   ZIMMERMAN,DONIELLE MPA-C 10/29/2011   Dg Chest 2 View  10/29/2011  *RADIOLOGY REPORT*  Clinical Data: VATS.  Left chest pain.  CHEST - 2 VIEW  Comparison: 10/28/2011  Findings: Small left-sided pneumothorax is stable.  Interval removal of the left chest tube and right central line.  Left base atelectasis with small left effusion.  Right lung is clear.  Heart is normal size.  IMPRESSION: Interval removal of left chest tube.  Stable small left pneumothorax.  Original Report Authenticated By: Raelyn Number, M.D.   Dg Chest Port 1 View  10/28/2011  *RADIOLOGY REPORT*  Clinical Data: Chest tube  PORTABLE CHEST - 1 VIEW  Comparison:   the previous day's study  Findings: Stable left chest tube with a small residual apical pneumothorax as before.  Right IJ central line stable.  Heart size upper limits normal for technique.  Slightly lower lung volumes with some increase in patchy atelectasis or infiltrates in the lung bases, left greater than right.  IMPRESSION:  1.  Stable left chest tube and tiny residual apical pneumothorax. 2.  Some increase in bibasilar atelectasis or early infiltrates.  Original Report Authenticated By: Trecia Rogers, M.D.   I have seen and examined Kimberly [name] and agree with the above assessment  and plan.  Grace Isaac MD Beeper [phone] Office [phone] 10/29/2011 9:50 AM

## 2011-10-30 ENCOUNTER — Inpatient Hospital Stay (HOSPITAL_COMMUNITY): Payer: Medicaid Other

## 2011-10-30 LAB — GLUCOSE, CAPILLARY: Glucose-Capillary: 160 mg/dL — ABNORMAL HIGH (ref 70–99)

## 2011-10-30 MED ORDER — TRAMADOL HCL 50 MG PO TABS
50.0000 mg | ORAL_TABLET | ORAL | Status: DC | PRN
  Administered 2011-10-30: 50 mg via ORAL
  Filled 2011-10-30: qty 1

## 2011-10-30 MED ORDER — TRAMADOL HCL 50 MG PO TABS: 50.0000 mg | ORAL_TABLET | ORAL | Status: DC | PRN

## 2011-10-30 MED ORDER — OXYCODONE-ACETAMINOPHEN 7.5-325 MG PO TABS: 1.0000 | ORAL_TABLET | ORAL | Status: AC | PRN

## 2011-10-30 NOTE — Progress Notes (Signed)
Pt given discharge instructions and verbalized understanding. BC

## 2011-10-30 NOTE — Progress Notes (Signed)
                    [address]            [place] [address]          [phone]     4 Days Post-Op Procedure(s) (LRB): VIDEO ASSISTED THORACOSCOPY (VATS)/WEDGE RESECTION (Left) THORACOTOMY MAJOR (Left)  Subjective: Feels much better today.  No nausea, eating better. Still really sore.  Objective: Vital signs in last 24 hours: Patient Vitals for the past 24 hrs:  BP Temp Temp src Pulse Resp SpO2  10/30/11 0355 128/60 mmHg 98 F (36.7 C) Oral - - 97 %  10/30/11 0000 146/69 mmHg 98.6 F (37 C) Oral - - -  10/29/11 2018 - - - 84  22  97 %  10/29/11 2000 139/63 mmHg 98.4 F (36.9 C) Oral - - 96 %  10/29/11 1600 147/73 mmHg 98.7 F (37.1 C) Oral - - -  10/29/11 1143 131/62 mmHg 98.2 F (36.8 C) Oral 85  24  97 %  10/29/11 1131 - - - - - 100 %  10/29/11 0812 - - - - - 95 %   Current Weight  10/26/11 127.4 kg (280 lb 13.9 oz)     Intake/Output from previous day: 03/04 0701 - 03/05 0700 In: 20 [I.V.:20] Out: -   CBGs [phone]  PHYSICAL EXAM:  Heart: RRR Lungs: clear Wound: clean and dry   Lab Results: CBC: Basename 10/28/11 0630  WBC 4.7  HGB 11.5*  HCT 35.2*  PLT 144*   BMET:  Basename 10/28/11 0630  NA 141  K 3.6  CL 104  CO2 30  GLUCOSE 166*  BUN 6  CREATININE 0.55  CALCIUM 9.1    PT/INR: No results found for this basename: LABPROT,INR in the last 72 hours  CXR stable, tiny L apical ptx nearly resolved  Assessment/Plan: S/P Procedure(s) (LRB): VIDEO ASSISTED THORACOSCOPY (VATS)/WEDGE RESECTION (Left) THORACOTOMY MAJOR (Left) Discharge home today.   LOS: 4 days    Kimberly [name] H 10/30/2011

## 2011-10-30 NOTE — Discharge Summary (Signed)
[address]            [place] [address]          [phone]         Discharge Summary  Name: Kimberly [name] DOB: 12-Jan-1961 51 y.o. MRN: [id number]  Admission Date: 10/26/2011 Discharge Date: 10/30/2011   Admitting Diagnosis: Active Problems:  Nodule of left lung   Discharge Diagnosis:   Left lung nodule (frozen section favors carcinoid, final pathology pending)  Type 2 diabetes mellitus  Depression  Anxiety  Remote history of tobacco use  Procedures: Procedure(s): LEFT VIDEO ASSISTED THORACOSCOPY (VATS)/MINI-THORACOTOMY, LEFT LOWER LOBE WEDGE RESECTION on 10/26/2011   HPI:  The patient is a 51 y.o. female with a remote heavy smoking history who reports having some abdominal pain back in May of 2011 for which a CT scan of the abdomen was performed. This showed an incidental 8 x 13 x 11 mm left lower lobe lung nodule at the base of the left lung posteriorly. She had a CT scan of the chest to confirm this nodule and showed no other abnormality. It was decided that this small nodule should be followed and a repeat CT scan of the chest was done on 08/30/2011. This showed that the nodule had increased in size compared to previous scan and was now 12 x 16 x 13 mm. There were no other lung lesions seen. There is no mediastinal or hilar adenopathy. There is diffuse fatty infiltration of the liver and mild splenomegaly. She was referred to Dr. Gilford Raid for surgical evaluation. Dr. Cyndia Bent felt that the patient would benefit from resection at this time. All risks, benefits and alternatives of surgery were explained in detail, and the patient agreed to proceed.     Hospital Course:  The patient was admitted to [facility] on 10/26/2011. The patient was taken to the operating room and underwent the above procedure.    The postoperative course has generally been uneventful. She initially had some issues with nausea, but this has resolved and she  is tolerating a regular diet. Her blood sugars have been elevated and she has been restarted on her home insulin doses. Her chest tubes have been removed in the standard fashion and her followup chest x-ray is stable with only a tiny left apical pneumothorax. She is ambulating in the halls without difficulty. Her final pathology remains pending at the time of this dictation, however intraoperative frozen sections favor carcinoid tumor. She has been evaluated on this date and is ready for discharge home.   Recent vital signs:  Filed Vitals:   10/30/11 0355  BP: 128/60  Pulse:   Temp: 98 F (36.7 C)  Resp:     Recent laboratory studies:  CBC: Basename 10/28/11 0630  WBC 4.7  HGB 11.5*  HCT 35.2*  PLT 144*   BMET:  Basename 10/28/11 0630  NA 141  K 3.6  CL 104  CO2 30  GLUCOSE 166*  BUN 6  CREATININE 0.55  CALCIUM 9.1    PT/INR: No results found for this basename: LABPROT,INR in the last 72 hours  Discharge Medications:   Medication List  As of 10/30/2011  7:56 AM   STOP taking these medications         glucose blood test strip         TAKE these medications  ATIVAN 0.5 MG tablet   Generic drug: LORazepam   Take 0.5 mg by mouth 3 (three) times daily as needed. For anxiety      LEVEMIR FLEXPEN 100 UNIT/ML injection   Generic drug: insulin detemir   Inject 48 Units into the skin 2 (two) times daily.      NOVOLOG FLEXPEN 100 UNIT/ML injection   Generic drug: insulin aspart   Inject 35 Units into the skin 3 (three) times daily before meals.      pantoprazole 40 MG tablet   Commonly known as: PROTONIX   Take 40 mg by mouth daily.      PROAIR HFA 108 (90 BASE) MCG/ACT inhaler   Generic drug: albuterol   Inhale 2 puffs into the lungs every 6 (six) hours as needed. For shortness of breath      albuterol (2.5 MG/3ML) 0.083% nebulizer solution   Commonly known as: PROVENTIL   Take 2.5 mg by nebulization every 6 (six) hours as needed. For shortness of breath        SYMLIN 600 MCG/ML injection   Generic drug: pramlintide   Inject 60 mcg into the skin 3 (three) times daily.      traMADol 50 MG tablet   Commonly known as: ULTRAM   Take 1-2 tablets (50-100 mg total) by mouth every 4 (four) hours as needed for pain.      WELLBUTRIN XL 150 MG 24 hr tablet   Generic drug: buPROPion   Take 150 mg by mouth every evening. **BRAND NAME MEDICALLY NECESSARY**            Discharge Instructions:  The patient is to refrain from driving, heavy lifting or strenuous activity.  May shower daily and clean incisions with soap and water.  May resume regular diet.  Discharge Orders    Future Appointments: Provider: Department: Dept Phone: Center:   11/20/2011 3:00 PM Gaye Pollack, MD [facility] [phone] [facility]      Follow-up Information    Follow up with Gaye Pollack, MD. (March 26,2013 at 3:00; please obtain chest xray at [place] at 2:00 in the same office complex.)    Contact information:   [place] 10/30/2011, 7:56 AM

## 2011-10-31 ENCOUNTER — Encounter (HOSPITAL_COMMUNITY): Payer: Self-pay | Admitting: Surgery

## 2011-11-09 ENCOUNTER — Other Ambulatory Visit: Payer: Self-pay | Admitting: *Deleted

## 2011-11-09 DIAGNOSIS — G8918 Other acute postprocedural pain: Secondary | ICD-10-CM

## 2011-11-09 MED ORDER — TRAMADOL HCL 50 MG PO TABS: 50.0000 mg | ORAL_TABLET | Freq: Four times a day (QID) | ORAL | Status: AC | PRN

## 2011-11-13 ENCOUNTER — Other Ambulatory Visit: Payer: Self-pay | Admitting: Surgery

## 2011-11-13 DIAGNOSIS — D381 Neoplasm of uncertain behavior of trachea, bronchus and lung: Secondary | ICD-10-CM

## 2011-11-20 ENCOUNTER — Ambulatory Visit
Admission: RE | Admit: 2011-11-20 | Discharge: 2011-11-20 | Disposition: A | Discharge status: Home / Self Care | Payer: Medicaid Other | Source: Ambulatory Visit | Attending: Surgery | Admitting: Surgery

## 2011-11-20 ENCOUNTER — Encounter: Payer: Self-pay | Admitting: Surgery

## 2011-11-20 ENCOUNTER — Ambulatory Visit (INDEPENDENT_AMBULATORY_CARE_PROVIDER_SITE_OTHER): Payer: Self-pay | Admitting: Surgery

## 2011-11-20 VITALS — BP 151/81 | HR 81 | Resp 18 | Ht 70.0 in | Wt 274.0 lb

## 2011-11-20 DIAGNOSIS — D381 Neoplasm of uncertain behavior of trachea, bronchus and lung: Secondary | ICD-10-CM

## 2011-11-20 DIAGNOSIS — R911 Solitary pulmonary nodule: Secondary | ICD-10-CM

## 2011-11-20 DIAGNOSIS — Z9889 Other specified postprocedural states: Secondary | ICD-10-CM

## 2011-11-20 NOTE — Progress Notes (Signed)
[address]            [place] [address]          [phone]     HPI:  Patient returns for routine postoperative follow-up having undergone left thoracotomy and wedge resection of a left lower lobe lung mass on 10/26/2011. The patient's early postoperative recovery while in the hospital was notable for an uncomplicated postop course. Since hospital discharge the patient reports she has continued to improve with less chest discomfort.   Current Outpatient Prescriptions  Medication Sig Dispense Refill  . albuterol (PROAIR HFA) 108 (90 BASE) MCG/ACT inhaler Inhale 2 puffs into the lungs every 6 (six) hours as needed. For shortness of breath      . albuterol (PROVENTIL) (2.5 MG/3ML) 0.083% nebulizer solution Take 2.5 mg by nebulization every 6 (six) hours as needed. For shortness of breath      . buPROPion (WELLBUTRIN XL) 150 MG 24 hr tablet Take 150 mg by mouth every evening. **BRAND NAME MEDICALLY NECESSARY**      . insulin aspart (NOVOLOG FLEXPEN) 100 UNIT/ML injection Inject 35 Units into the skin 3 (three) times daily before meals.       . insulin detemir (LEVEMIR FLEXPEN) 100 UNIT/ML injection Inject 48 Units into the skin 2 (two) times daily.       Marland Kitchen LORazepam (ATIVAN) 0.5 MG tablet Take 0.5 mg by mouth 3 (three) times daily as needed. For anxiety      . pantoprazole (PROTONIX) 40 MG tablet Take 40 mg by mouth daily.      . pramlintide (SYMLIN) 600 MCG/ML injection Inject 60 mcg into the skin 3 (three) times daily.        . traMADol (ULTRAM) 50 MG tablet Take 1 tablet (50 mg total) by mouth every 6 (six) hours as needed for pain (one or two tabs every 4-6 hours prn....not to exceed 8 per day).  40 tablet  0    Physical Exam: BP 151/81  Pulse 81  Resp 18  Ht 5' 10"  (1.778 m)  Wt 274 lb (124.286 kg)  BMI 39.32 kg/m2  SpO2 98%  She looks well Lungs: clear Heart:  RRR Chest incision healing well. Mild swelling of left chest wall  remains.  Diagnostic Tests:  Clinical Data: 3 weeks post left lung surgery.   CHEST - 2 VIEW   Comparison: 10/30/2011.   Findings: There is a small persistent left pleural effusion with overlying atelectasis.  A small amount of fluid in the left major fissure is also noted.  The right lung remains clear.  The cardiac silhouette, mediastinal and hilar contours are stable.   IMPRESSION:   1.  Residual left pleural effusion and fluid noted in the major fissure. 2.  Stable surgical scarring changes and left basilar atelectasis.   Original Report Authenticated By: P. Kalman Jewels, M.D.   Patient Name: Kimberly [name], Kimberly [name] Accession #: [id number] DOB: 02-Dec-1960 Age: 51 Gender: F Client Name [place] Collected Date: 10/26/2011 Received Date: 10/26/2011 Physician: Gilford Raid Chart #: MRN # : [id number] Physician cc: Race:W Visit #: [id number] REPORT OF SURGICAL PATHOLOGY FINAL DIAGNOSIS Diagnosis Lung, wedge biopsy/resection, left lower lobe - WELL DIFFERENTIATED NEUROENDOCRINE TUMOR (CARCINOID), SPANNING 1.5 CM. - PLEURA NOT INVOLVED. - LYMPH VASCULAR INVASION NOT IDENTIFIED. - MARGINS ARE NEGATIVE. - SEE ONCOLOGY TEMPLATE AND COMMENT.   Impression:  Doing  well s/p wedge resection of LLL carcinoid.  Plan:  Followup in 3 months with chest xray.

## 2012-02-11 ENCOUNTER — Other Ambulatory Visit: Payer: Self-pay | Admitting: Surgery

## 2012-02-11 DIAGNOSIS — D381 Neoplasm of uncertain behavior of trachea, bronchus and lung: Secondary | ICD-10-CM

## 2012-02-19 ENCOUNTER — Encounter: Payer: Medicaid Other | Admitting: Surgery

## 2012-02-19 ENCOUNTER — Other Ambulatory Visit: Payer: Self-pay | Admitting: Surgery

## 2012-02-19 DIAGNOSIS — D381 Neoplasm of uncertain behavior of trachea, bronchus and lung: Secondary | ICD-10-CM

## 2012-02-26 ENCOUNTER — Ambulatory Visit (INDEPENDENT_AMBULATORY_CARE_PROVIDER_SITE_OTHER): Payer: Medicaid Other | Admitting: Surgery

## 2012-02-26 ENCOUNTER — Encounter: Payer: Self-pay | Admitting: Surgery

## 2012-02-26 ENCOUNTER — Ambulatory Visit
Admission: RE | Admit: 2012-02-26 | Discharge: 2012-02-26 | Disposition: A | Discharge status: Home / Self Care | Payer: Medicaid Other | Source: Ambulatory Visit | Attending: Surgery | Admitting: Surgery

## 2012-02-26 VITALS — BP 137/79 | HR 92 | Resp 16 | Ht 70.0 in | Wt 273.0 lb

## 2012-02-26 DIAGNOSIS — Z09 Encounter for follow-up examination after completed treatment for conditions other than malignant neoplasm: Secondary | ICD-10-CM

## 2012-02-26 DIAGNOSIS — D3A00 Benign carcinoid tumor of unspecified site: Secondary | ICD-10-CM

## 2012-02-26 DIAGNOSIS — D381 Neoplasm of uncertain behavior of trachea, bronchus and lung: Secondary | ICD-10-CM

## 2012-02-26 NOTE — Progress Notes (Signed)
[address]            [place] [address]          [phone]      HPI:  Patient returns for routine postoperative follow-up having undergone left thoracotomy and wedge resection of a left lower lobe lung mass on 10/26/2011. Pathology showed a stage IA well-differentiated carcinoid tumor. Since I last saw her she has continued to feel well. She has mild left chest wall discomfort that is improving. She said she does have periods when she feels that her asthma is acting up and her lungs feel tight. She has been using her son's albuterol nebulizer which really improves her symptoms.   Current Outpatient Prescriptions  Medication Sig Dispense Refill  . albuterol (PROAIR HFA) 108 (90 BASE) MCG/ACT inhaler Inhale 2 puffs into the lungs every 6 (six) hours as needed. For shortness of breath      . albuterol (PROVENTIL) (2.5 MG/3ML) 0.083% nebulizer solution Take 2.5 mg by nebulization every 6 (six) hours as needed. For shortness of breath      . buPROPion (WELLBUTRIN XL) 150 MG 24 hr tablet Take 150 mg by mouth every evening. **BRAND NAME MEDICALLY NECESSARY**      . insulin aspart (NOVOLOG FLEXPEN) 100 UNIT/ML injection Inject 35 Units into the skin 3 (three) times daily before meals.       . insulin detemir (LEVEMIR FLEXPEN) 100 UNIT/ML injection Inject 48 Units into the skin 2 (two) times daily.       Marland Kitchen LORazepam (ATIVAN) 0.5 MG tablet Take 0.5 mg by mouth 3 (three) times daily as needed. For anxiety      . pantoprazole (PROTONIX) 40 MG tablet Take 40 mg by mouth daily.         Physical Exam:  BP 137/79  Pulse 92  Resp 16  Ht 5' 10"  (1.778 m)  Wt 273 lb (123.832 kg)  BMI 39.17 kg/m2  SpO2 96% She looks well. Lung exam is clear. The left chest incision is well healed.  Diagnostic Tests:  *RADIOLOGY REPORT*   Clinical Data: Post resection of left lower lobe lung nodule in 10/2011.  Follow-up.   CHEST - 2 VIEW   Comparison: Two-view chest  x-ray 11/20/2011, 10/30/2011, 10/24/2011.   Findings: Interval decrease in size of the left pleural effusion. The blunting of the left costophrenic angle that remains may be post-surgical pleural scarring.  Parenchymal scarring is present in the adjacent left lower lobe.  Lungs remain clear otherwise. Cardiomediastinal silhouette unremarkable, unchanged.  No right pleural effusion.  Visualized bony thorax intact.   IMPRESSION: Reduction in size of the left pleural effusion since 11/20/2011. Blunting of the left costophrenic angle which remains may be due to post-surgical pleural scarring.  Parenchymal scarring is present in the left lower lobe.  No new/acute cardiopulmonary disease.    PATHOLOGY  Original Report Authenticated By: Deniece Portela, M.D. Patient Name: Kimberly [name], Kimberly [name] Accession #: [id number] DOB: 1961-05-29 Age: 28 Gender: F Client Name [name] Collected Date: 10/26/2011 Received Date: 10/26/2011 Physician: Gilford Raid Chart #: MRN # : [id number] Physician cc: Race:W Visit #: [id number] REPORT OF SURGICAL PATHOLOGY FINAL DIAGNOSIS Diagnosis Lung, wedge biopsy/resection, left lower lobe - WELL DIFFERENTIATED NEUROENDOCRINE TUMOR (CARCINOID), SPANNING 1.5 CM. - PLEURA NOT INVOLVED. - LYMPH VASCULAR INVASION NOT IDENTIFIED. - MARGINS ARE NEGATIVE. - SEE ONCOLOGY  TEMPLATE AND COMMENT.    Impression:  She continues to do well following her wedge resection for carcinoid tumor of the left lower lobe. Her asthma symptoms seem worse since her surgery but are significantly improved with her son's albuterol nebulizer. She would probably benefit from having her own albuterol nebulizer.  Plan:  I'll plan to see her back in 3 months and we'll repeat a CT scan the chest at that time. She will discuss her asthma with her primary physician to make a decision whether she needs an albuterol nebulizer or not.

## 2012-04-02 ENCOUNTER — Ambulatory Visit (INDEPENDENT_AMBULATORY_CARE_PROVIDER_SITE_OTHER): Payer: Medicaid Other | Admitting: Obstetrics and Gynecology

## 2012-04-02 ENCOUNTER — Encounter: Payer: Self-pay | Admitting: Obstetrics and Gynecology

## 2012-04-02 VITALS — BP 130/70 | Ht 70.0 in | Wt 280.0 lb

## 2012-04-02 DIAGNOSIS — N949 Unspecified condition associated with female genital organs and menstrual cycle: Secondary | ICD-10-CM

## 2012-04-02 DIAGNOSIS — Z01419 Encounter for gynecological examination (general) (routine) without abnormal findings: Secondary | ICD-10-CM

## 2012-04-02 DIAGNOSIS — Z0000 Encounter for general adult medical examination without abnormal findings: Secondary | ICD-10-CM

## 2012-04-02 NOTE — Progress Notes (Signed)
The patient is not taking hormone replacement therapy The patient  is not taking a Calcium supplement. Post-menopausal bleeding:no  Last Pap: was normal June  2012 Last mammogram: was normal March  2012 Last DEXA scan : T= unsure     Last colonoscopy:normal  Pt stated about 10 or 11 years ago   Urinary symptoms: none Normal bowel movements: Yes Reports abuse at home: No  Subjective:    Kimberly [name] is a 51 y.o. female G5P4 who presents for annual exam.  The patient c/o lower back pain and lower right abdominal pain. No bowel changes.  U/s to be scheduled.  The following portions of the patient's history were reviewed and updated as appropriate: allergies, current medications, past family history, past medical history, past social history, past surgical history and problem list.  Review of Systems Pertinent items are noted in HPI. Gastrointestinal:No change in bowel habits, no abdominal pain, no rectal bleeding Genitourinary:negative for dysuria, frequency, hematuria, nocturia and urinary incontinence    Objective:     BP 130/70  Ht 5' 10"  (1.778 m)  Wt 280 lb (127.007 kg)  BMI 40.18 kg/m2  Weight:  Wt Readings from Last 1 Encounters:  04/02/12 280 lb (127.007 kg)     BMI: Body mass index is 40.18 kg/(m^2). General Appearance: Alert, appropriate appearance for age. No acute distress HEENT: Grossly normal Neck / Thyroid: Supple, no masses, nodes or enlargement Lungs: partial lung removal on left. Carcinoid result. Repeat CT in 1 month. Back: No CVA tenderness Breast Exam: No masses or nodes.No dimpling, nipple retraction or discharge. Cardiovascular: Regular rate and rhythm. S1, S2, no murmur Gastrointestinal: Soft, non-tender, no masses or organomegaly Pelvic Exam: Vulva and vagina appear normal. Bimanual exam reveals normal uterus and adnexa. Rectovaginal: normal rectal, no masses Lymphatic Exam: Non-palpable nodes in neck, clavicular, axillary, or inguinal regions  Skin: no rash or abnormalities Neurologic: Normal gait and speech, no tremor  Psychiatric: Alert and oriented, appropriate affect.    Urinalysis:Not done      Assessment:    Pelvic u/s to be scheduled today    Plan:    Pap, mammogram. pelvic ultrasound  Pt to schedule colonoscopy  Follow-up:  For sono

## 2012-04-18 ENCOUNTER — Encounter: Payer: Self-pay | Admitting: Obstetrics and Gynecology

## 2012-04-18 ENCOUNTER — Ambulatory Visit (INDEPENDENT_AMBULATORY_CARE_PROVIDER_SITE_OTHER): Payer: Medicaid Other

## 2012-04-18 ENCOUNTER — Ambulatory Visit (INDEPENDENT_AMBULATORY_CARE_PROVIDER_SITE_OTHER): Payer: Medicaid Other | Admitting: Obstetrics and Gynecology

## 2012-04-18 ENCOUNTER — Other Ambulatory Visit: Payer: Self-pay | Admitting: Obstetrics and Gynecology

## 2012-04-18 VITALS — BP 132/72 | Ht 70.0 in | Wt 280.0 lb

## 2012-04-18 DIAGNOSIS — N949 Unspecified condition associated with female genital organs and menstrual cycle: Secondary | ICD-10-CM

## 2012-04-18 DIAGNOSIS — R1031 Right lower quadrant pain: Secondary | ICD-10-CM

## 2012-04-18 NOTE — Progress Notes (Signed)
Subjective:    Kimberly [name] is a 51 y.o. female, G5P4, who presents for Gyn ultrasound because of RLQ pain.  The following portions of the patient's history were reviewed and updated as appropriate: allergies, current medications, past family history.  Objective:    BP 132/72  Ht 5' 10"  (1.778 m)  Wt 280 lb (127.007 kg)  BMI 40.18 kg/m2    Weight:  Wt Readings from Last 1 Encounters:  04/18/12 280 lb (127.007 kg)          BMI: Body mass index is 40.18 kg/(m^2).  ULTRASOUND:    Urinary bladder - unremarkable.   AV uterus. C/S scar noted.  No uterine masses are seen.  Thin endometrium.   Ovaries are well visualized. Appearance is normal and c/w post menopausal status.  No adnexal abnormality.  No CDS fluid     Assessment:    RLQ pain with normal GYN ultrasound    Plan:    Reassurance. Pt to see PCP

## 2012-04-22 ENCOUNTER — Other Ambulatory Visit: Payer: Self-pay | Admitting: Obstetrics and Gynecology

## 2012-04-22 DIAGNOSIS — Z1231 Encounter for screening mammogram for malignant neoplasm of breast: Secondary | ICD-10-CM

## 2012-05-07 ENCOUNTER — Ambulatory Visit: Payer: Medicaid Other

## 2012-05-14 ENCOUNTER — Other Ambulatory Visit: Payer: Self-pay | Admitting: Surgery

## 2012-05-14 DIAGNOSIS — D381 Neoplasm of uncertain behavior of trachea, bronchus and lung: Secondary | ICD-10-CM

## 2012-05-23 ENCOUNTER — Ambulatory Visit: Payer: Medicaid Other

## 2012-05-28 ENCOUNTER — Telehealth: Payer: Self-pay | Admitting: Obstetrics and Gynecology

## 2012-05-28 NOTE — Telephone Encounter (Signed)
TC from pt. States has had vaginal irritation x 1 month. Now has lumps around labia x a few weeks which are becoming painful.  No drainage.  Offered pt several appts but unable to coordinate with pt's schedule. Pt has [employer]. To call for authorization and call back if desires appt.

## 2012-05-29 ENCOUNTER — Ambulatory Visit (INDEPENDENT_AMBULATORY_CARE_PROVIDER_SITE_OTHER): Payer: Medicaid Other | Admitting: Obstetrics and Gynecology

## 2012-05-29 ENCOUNTER — Telehealth: Payer: Self-pay | Admitting: Obstetrics and Gynecology

## 2012-05-29 VITALS — BP 122/74 | Temp 98.2°F | Ht 70.0 in | Wt 281.0 lb

## 2012-05-29 DIAGNOSIS — B373 Candidiasis of vulva and vagina: Secondary | ICD-10-CM

## 2012-05-29 DIAGNOSIS — N762 Acute vulvitis: Secondary | ICD-10-CM

## 2012-05-29 DIAGNOSIS — N760 Acute vaginitis: Secondary | ICD-10-CM

## 2012-05-29 LAB — POCT WET PREP (WET MOUNT)
Source Wet Prep POC: NEGATIVE
WBC, Wet Prep HPF POC: NEGATIVE

## 2012-05-29 MED ORDER — FLUCONAZOLE 100 MG PO TABS: 100.0000 mg | ORAL_TABLET | Freq: Every day | ORAL | Status: DC

## 2012-05-29 MED ORDER — SULFAMETHOXAZOLE-TRIMETHOPRIM 800-160 MG PO TABS: 1.0000 | ORAL_TABLET | Freq: Two times a day (BID) | ORAL | Status: AC

## 2012-05-29 NOTE — Patient Instructions (Addendum)
Vulvar Abscess  The vulva is made up of the large and small flaps of skin around the vagina opening. A vulvar abscess is an infected sac of yellowish white fluid (pus) in the skin flaps. Your doctor may make a small cut in the skin to drain the vulvar abscess.  HOME CARE  Only take medicine as told by your doctor.  Soak or take a warm water bath (sitz bath) 3 to 4 times a day for 15 to 20 minutes.  After you pee (urinate), always wipe from front to back.  Clean the vulvar abscess with soap and warm water. Do this after going to the bathroom.  Wear loose-fitting clothing.  Do not have sex until the vulvar abscess is gone or as told by your doctor. GET HELP RIGHT AWAY IF:   You have a temperature by mouth above 102 F (38.9 C).  The vulva area becomes more painful, puffy (swollen), or red.  You have fluid coming from the vulva area that is red or tan.  You have pain when you pee or have a hard time peeing. MAKE SURE YOU:  Understand these instructions.  Will watch your condition.  Will get help if you are not doing well or get worse. Document Released: 11/09/2008 Document Revised: 11/05/2011 Document Reviewed: 11/09/2008 [facility] Patient Information 2013 [place].

## 2012-05-29 NOTE — Telephone Encounter (Signed)
Tc to pt per telephone call. Pt c/o "2 labial masses" increasing in size x 2weeks. Pt states,"1 mass takes up one side of labia".  No drainage. Pt with nausea and fatigue. No fever. No vag discharge. Appt sched today @ 1:45 with ND for eval. Pt agrees.

## 2012-05-29 NOTE — Progress Notes (Signed)
Pt c/o 2 boils in vaginal area for two weeks started out pea size now size of quarter very painful.  They have been present for a week and is getting worse.  She also c/o white vaginal discharge without odor.  Pt is DM and her fastings are 200 BP 122/74  Temp 98.2 F (36.8 C)  Ht 5' 10"  (1.778 m)  Wt 281 lb (127.461 kg)  BMI 40.32 kg/m2 Vulva both labium majorum are red.  The right is indurated and larger than the left.  The vagina is atrophic with white discharge Pt consented to I&D of right labium.  Betadine used for prep.  Three cc1%lidocaine used for anesthesia.  A knife was used to incise and drain the lesion. No pus only blood was seen . The area was hard and indurated.  The area was packed.  Hemostasis was obtianed with pressure and silver nitrate.   Pt given Bactrim for 14 days Area cultured HSV cX done Call to RT for bx if no relief in one wee Increase ISS to control BS Diflucan for yeast

## 2012-05-30 ENCOUNTER — Encounter: Payer: Medicaid Other | Admitting: Obstetrics and Gynecology

## 2012-06-03 ENCOUNTER — Other Ambulatory Visit: Payer: Medicaid Other

## 2012-06-03 ENCOUNTER — Ambulatory Visit: Payer: Medicaid Other | Admitting: Surgery

## 2012-06-04 ENCOUNTER — Telehealth: Payer: Self-pay

## 2012-06-04 NOTE — Telephone Encounter (Signed)
Lm on vm tcb rgd labs

## 2012-06-04 NOTE — Telephone Encounter (Signed)
Message copied by Ilean China on Wed Jun 04, 2012  9:33 AM ------      Message from: Crawford Givens      Created: Tue Jun 03, 2012 10:32 PM       Please let pt know her culture was negative.       ----- Message -----         From: [facility]         Sent: 05/31/2012   2:29 PM           To: Betsy Coder, MD

## 2012-06-05 NOTE — Telephone Encounter (Signed)
Lm on vm tcb rgd labs

## 2012-06-06 NOTE — Telephone Encounter (Signed)
Lm on vm tcb rgd labs

## 2012-06-09 ENCOUNTER — Other Ambulatory Visit: Payer: Self-pay | Admitting: Surgery

## 2012-06-09 DIAGNOSIS — R911 Solitary pulmonary nodule: Secondary | ICD-10-CM

## 2012-06-10 ENCOUNTER — Ambulatory Visit (INDEPENDENT_AMBULATORY_CARE_PROVIDER_SITE_OTHER): Payer: Medicaid Other | Admitting: Surgery

## 2012-06-10 ENCOUNTER — Ambulatory Visit
Admission: RE | Admit: 2012-06-10 | Discharge: 2012-06-10 | Disposition: A | Discharge status: Home / Self Care | Payer: Medicaid Other | Source: Ambulatory Visit | Attending: Surgery | Admitting: Surgery

## 2012-06-10 ENCOUNTER — Encounter: Payer: Self-pay | Admitting: Surgery

## 2012-06-10 VITALS — BP 150/68 | HR 80 | Resp 20 | Ht 70.0 in | Wt 281.0 lb

## 2012-06-10 DIAGNOSIS — D3A00 Benign carcinoid tumor of unspecified site: Secondary | ICD-10-CM

## 2012-06-10 DIAGNOSIS — D381 Neoplasm of uncertain behavior of trachea, bronchus and lung: Secondary | ICD-10-CM

## 2012-06-10 DIAGNOSIS — Z09 Encounter for follow-up examination after completed treatment for conditions other than malignant neoplasm: Secondary | ICD-10-CM

## 2012-06-10 NOTE — Progress Notes (Signed)
                    [address]            [place] [address]          [phone]      HPI:  The patient returns today for routine postoperative followup status post left thoracotomy and wedge resection of a left lower lobe carcinoid tumor on 10/26/2011. This was a well-differentiated carcinoid with negative resection margins. She continues to do well with minimal left chest wall discomfort. She denies any cough or sputum production. She denies hemoptysis. She's had no headaches or visual changes.  Current Outpatient Prescriptions  Medication Sig Dispense Refill  . albuterol (PROAIR HFA) 108 (90 BASE) MCG/ACT inhaler Inhale 2 puffs into the lungs every 6 (six) hours as needed. For shortness of breath      . albuterol (PROVENTIL) (2.5 MG/3ML) 0.083% nebulizer solution Take 2.5 mg by nebulization every 6 (six) hours as needed. For shortness of breath      . buPROPion (WELLBUTRIN XL) 150 MG 24 hr tablet Take 150 mg by mouth every evening. **BRAND NAME MEDICALLY NECESSARY**      . cholecalciferol (VITAMIN D) 1000 UNITS tablet Take 10,000 Units by mouth daily.      . insulin aspart (NOVOLOG FLEXPEN) 100 UNIT/ML injection Inject 35 Units into the skin 3 (three) times daily before meals.       . insulin detemir (LEVEMIR FLEXPEN) 100 UNIT/ML injection Inject 48 Units into the skin 2 (two) times daily.       Marland Kitchen LORazepam (ATIVAN) 0.5 MG tablet Take 0.5 mg by mouth 3 (three) times daily as needed. For anxiety      . pantoprazole (PROTONIX) 40 MG tablet Take 40 mg by mouth daily.      Marland Kitchen sulfamethoxazole-trimethoprim (BACTRIM DS) 800-160 MG per tablet Take 1 tablet by mouth 2 (two) times daily.  28 tablet  0     Physical Exam: BP 150/68  Pulse 80  Resp 20  Ht 5' 10"  (1.778 m)  Wt 281 lb (127.461 kg)  BMI 40.32 kg/m2  SpO2 95% She looks well. There is no cervical or supraclavicular adenopathy. Lungs clear. The left chest incision is well-healed. There are no skin  lesions.  Diagnostic Tests:  CT scan of the chest today shows no evidence of recurrent tumor. There are minimal streaky postoperative changes in the left lower lobe following wedge resection.  Impression:  She continues to do well following wedge resection for a small carcinoid tumor.  Plan:  I'll plan to see her back in 6 months with a chest x-ray.

## 2012-06-16 ENCOUNTER — Telehealth: Payer: Self-pay

## 2012-06-16 NOTE — Telephone Encounter (Signed)
Spoke with pt informed culture negative pt voice understanding

## 2012-06-23 ENCOUNTER — Other Ambulatory Visit: Payer: Self-pay | Admitting: Infectious Diseases

## 2012-06-29 ENCOUNTER — Other Ambulatory Visit: Payer: Self-pay | Admitting: Infectious Diseases

## 2012-12-15 ENCOUNTER — Other Ambulatory Visit: Payer: Self-pay | Admitting: *Deleted

## 2012-12-15 DIAGNOSIS — R911 Solitary pulmonary nodule: Secondary | ICD-10-CM

## 2012-12-16 ENCOUNTER — Encounter: Payer: Medicaid Other | Admitting: Surgery

## 2012-12-17 ENCOUNTER — Encounter: Payer: Self-pay | Admitting: Surgery

## 2012-12-17 ENCOUNTER — Ambulatory Visit (INDEPENDENT_AMBULATORY_CARE_PROVIDER_SITE_OTHER): Payer: Medicaid Other | Admitting: Surgery

## 2012-12-17 ENCOUNTER — Ambulatory Visit
Admission: RE | Admit: 2012-12-17 | Discharge: 2012-12-17 | Disposition: A | Discharge status: Home / Self Care | Payer: Medicaid Other | Source: Ambulatory Visit | Attending: Surgery | Admitting: Surgery

## 2012-12-17 VITALS — BP 148/80 | HR 80 | Resp 20 | Ht 70.0 in | Wt 281.0 lb

## 2012-12-17 DIAGNOSIS — R911 Solitary pulmonary nodule: Secondary | ICD-10-CM

## 2012-12-17 DIAGNOSIS — D3A00 Benign carcinoid tumor of unspecified site: Secondary | ICD-10-CM

## 2012-12-17 NOTE — Progress Notes (Signed)
   [address]       [place] [address]             [phone]      HPI:  The patient returns today for routine postoperative followup status post left thoracotomy and wedge resection of a left lower lobe carcinoid tumor on 10/26/2011. This was a well-differentiated carcinoid with negative resection margins. She continues to do well with minimal left chest wall discomfort. She denies any cough or sputum production. She denies hemoptysis. She's had no headaches or visual changes.      Current Outpatient Prescriptions  Medication Sig Dispense Refill  . albuterol (PROAIR HFA) 108 (90 BASE) MCG/ACT inhaler Inhale 2 puffs into the lungs every 6 (six) hours as needed. For shortness of breath      . albuterol (PROVENTIL) (2.5 MG/3ML) 0.083% nebulizer solution Take 2.5 mg by nebulization every 6 (six) hours as needed. For shortness of breath      . buPROPion (WELLBUTRIN XL) 150 MG 24 hr tablet Take 150 mg by mouth every evening. **BRAND NAME MEDICALLY NECESSARY**      . cholecalciferol (VITAMIN D) 1000 UNITS tablet Take 10,000 Units by mouth daily.      . insulin aspart (NOVOLOG FLEXPEN) 100 UNIT/ML injection Inject 35 Units into the skin 3 (three) times daily before meals.       . insulin detemir (LEVEMIR FLEXPEN) 100 UNIT/ML injection Inject 48 Units into the skin 2 (two) times daily.       . Liraglutide (VICTOZA) 18 MG/3ML SOLN injection Inject 0.6 mg into the skin daily.      Marland Kitchen LORazepam (ATIVAN) 0.5 MG tablet take 1 tablet by mouth twice a day if needed for anxiety  30 tablet  2  . pantoprazole (PROTONIX) 40 MG tablet Take 40 mg by mouth daily.       No current facility-administered medications for this visit.     Physical Exam:  BP 148/80  Pulse 80  Resp 20  Ht 5' 10"  (1.778 m)  Wt 281 lb (127.461 kg)  BMI 40.32 kg/m2  SpO2 97% She looks well. There is no cervical or supraclavicular adenopathy.  Lungs are clear.  The left chest incision is well-healed. There  are no skin lesions.       Diagnostic Tests:  *RADIOLOGY REPORT*   Clinical Data: Cough.  History of left lower lobe of the wedge resection for carcinoid tumor.   CHEST - 2 VIEW   Comparison: CT chest 06/10/2012 and PA and lateral chest 02/26/2012.   Findings: Scarring in the left lung base is again seen.  Lungs otherwise clear.  Heart size normal.  No pneumothorax.   IMPRESSION: Negative for acute or recurrent disease.  Postoperative change left lower lobe.     Original Report Authenticated By: Orlean Patten, M.D.     Impression:  She continues to do well following wedge resection of a left lower lobe carcinoid tumor.   Plan:  I will see her back in 6 months with a cxr.

## 2013-01-28 ENCOUNTER — Ambulatory Visit
Admission: RE | Admit: 2013-01-28 | Discharge: 2013-01-28 | Disposition: A | Discharge status: Home / Self Care | Payer: Medicaid Other | Source: Ambulatory Visit | Attending: Obstetrics and Gynecology | Admitting: Obstetrics and Gynecology

## 2013-01-28 DIAGNOSIS — Z1231 Encounter for screening mammogram for malignant neoplasm of breast: Secondary | ICD-10-CM

## 2013-02-20 ENCOUNTER — Other Ambulatory Visit: Payer: Self-pay | Admitting: Endocrinology

## 2013-02-20 DIAGNOSIS — E042 Nontoxic multinodular goiter: Secondary | ICD-10-CM

## 2013-03-05 ENCOUNTER — Other Ambulatory Visit: Payer: Medicaid Other

## 2013-03-06 ENCOUNTER — Ambulatory Visit
Admission: RE | Admit: 2013-03-06 | Discharge: 2013-03-06 | Disposition: A | Discharge status: Home / Self Care | Payer: Medicaid Other | Source: Ambulatory Visit | Attending: Endocrinology | Admitting: Endocrinology

## 2013-03-06 DIAGNOSIS — E042 Nontoxic multinodular goiter: Secondary | ICD-10-CM

## 2013-04-14 HISTORY — DX: Morbid (severe) obesity due to excess calories: E66.01

## 2013-06-08 ENCOUNTER — Other Ambulatory Visit: Payer: Self-pay | Admitting: *Deleted

## 2013-06-08 DIAGNOSIS — D3A00 Benign carcinoid tumor of unspecified site: Secondary | ICD-10-CM

## 2013-06-10 ENCOUNTER — Ambulatory Visit
Admission: RE | Admit: 2013-06-10 | Discharge: 2013-06-10 | Disposition: A | Discharge status: Home / Self Care | Payer: Medicaid Other | Source: Ambulatory Visit | Attending: Surgery | Admitting: Surgery

## 2013-06-10 ENCOUNTER — Ambulatory Visit (INDEPENDENT_AMBULATORY_CARE_PROVIDER_SITE_OTHER): Payer: Medicaid Other | Admitting: Surgery

## 2013-06-10 ENCOUNTER — Encounter (INDEPENDENT_AMBULATORY_CARE_PROVIDER_SITE_OTHER): Payer: Self-pay

## 2013-06-10 ENCOUNTER — Encounter: Payer: Self-pay | Admitting: Surgery

## 2013-06-10 DIAGNOSIS — D3A00 Benign carcinoid tumor of unspecified site: Secondary | ICD-10-CM

## 2013-06-10 DIAGNOSIS — C3430 Malignant neoplasm of lower lobe, unspecified bronchus or lung: Secondary | ICD-10-CM

## 2013-06-10 DIAGNOSIS — Z09 Encounter for follow-up examination after completed treatment for conditions other than malignant neoplasm: Secondary | ICD-10-CM

## 2013-06-10 NOTE — Progress Notes (Signed)
      [address]       [place] [address]             [id number]        HPI:  The patient returns today for routine postoperative followup status post left thoracotomy and wedge resection of a left lower lobe carcinoid tumor on 10/26/2011. This was a well-differentiated carcinoid with negative resection margins. She continues to do well with minimal left chest wall discomfort. She denies any cough or sputum production. She denies hemoptysis. She's had no headaches or visual changes.   Current Outpatient Prescriptions  Medication Sig Dispense Refill  . albuterol (PROAIR HFA) 108 (90 BASE) MCG/ACT inhaler Inhale 2 puffs into the lungs every 6 (six) hours as needed. For shortness of breath      . albuterol (PROVENTIL) (2.5 MG/3ML) 0.083% nebulizer solution Take 2.5 mg by nebulization every 6 (six) hours as needed. For shortness of breath      . buPROPion (WELLBUTRIN XL) 150 MG 24 hr tablet Take 150 mg by mouth every evening. **BRAND NAME MEDICALLY NECESSARY**      . cholecalciferol (VITAMIN D) 1000 UNITS tablet Take 10,000 Units by mouth daily.      . insulin aspart (NOVOLOG FLEXPEN) 100 UNIT/ML injection Inject 35 Units into the skin 3 (three) times daily before meals.       . insulin detemir (LEVEMIR FLEXPEN) 100 UNIT/ML injection Inject 48 Units into the skin 2 (two) times daily.       . Liraglutide (VICTOZA) 18 MG/3ML SOLN injection Inject 0.6 mg into the skin daily.      Marland Kitchen LORazepam (ATIVAN) 0.5 MG tablet take 1 tablet by mouth twice a day if needed for anxiety  30 tablet  2  . pantoprazole (PROTONIX) 40 MG tablet Take 40 mg by mouth daily.       No current facility-administered medications for this visit.     Physical Exam:  She looks well.  There is no cervical or supraclavicular adenopathy.  Lungs are clear.  The left chest incision is well-healed. There are no skin lesions.      Diagnostic Tests:  *RADIOLOGY REPORT*   Clinical Data: Post left  lung surgery (10/26/2011) for carcinoid tumor, former smoker, subsequent encounter.   CHEST - 2 VIEW   Comparison: 12/17/2012; 02/26/2012; 10/24/2011; chest CT - 06/10/2012; 08/30/2011;   Findings:   Grossly unchanged cardiac silhouette and mediastinal contours. Linear heterogeneous opacities in the peripheral aspect of the left mid and lower lung with associated mild right-sided volume loss are grossly unchanged.  No new focal airspace opacity.  No pleural effusion or pneumothorax.  No evidence of edema.  Grossly unchanged bones.   IMPRESSION: Stable postsurgical change of the left lung without acute cardiopulmonary disease.     Original Report Authenticated By: Jake Seats, MD   Impression:  She continues to do well following wedge resection of a left lower lobe carcinoid tumor.     Plan:  I will see her back in 6 months with a CT scan of the chest.

## 2013-08-01 ENCOUNTER — Encounter (HOSPITAL_BASED_OUTPATIENT_CLINIC_OR_DEPARTMENT_OTHER): Payer: Self-pay | Admitting: Emergency Medicine

## 2013-08-01 ENCOUNTER — Emergency Department (HOSPITAL_BASED_OUTPATIENT_CLINIC_OR_DEPARTMENT_OTHER)
Admission: EM | Admit: 2013-08-01 | Discharge: 2013-08-01 | Disposition: A | Discharge status: Home / Self Care | Payer: Medicaid Other | Attending: Emergency Medicine | Admitting: Emergency Medicine

## 2013-08-01 DIAGNOSIS — F329 Major depressive disorder, single episode, unspecified: Secondary | ICD-10-CM | POA: Insufficient documentation

## 2013-08-01 DIAGNOSIS — Z79899 Other long term (current) drug therapy: Secondary | ICD-10-CM | POA: Insufficient documentation

## 2013-08-01 DIAGNOSIS — Z860100 Personal history of colon polyps, unspecified: Secondary | ICD-10-CM | POA: Insufficient documentation

## 2013-08-01 DIAGNOSIS — K219 Gastro-esophageal reflux disease without esophagitis: Secondary | ICD-10-CM | POA: Insufficient documentation

## 2013-08-01 DIAGNOSIS — Z880 Allergy status to penicillin: Secondary | ICD-10-CM | POA: Insufficient documentation

## 2013-08-01 DIAGNOSIS — Z8709 Personal history of other diseases of the respiratory system: Secondary | ICD-10-CM | POA: Insufficient documentation

## 2013-08-01 DIAGNOSIS — Z8701 Personal history of pneumonia (recurrent): Secondary | ICD-10-CM | POA: Insufficient documentation

## 2013-08-01 DIAGNOSIS — E119 Type 2 diabetes mellitus without complications: Secondary | ICD-10-CM | POA: Insufficient documentation

## 2013-08-01 DIAGNOSIS — F411 Generalized anxiety disorder: Secondary | ICD-10-CM | POA: Insufficient documentation

## 2013-08-01 DIAGNOSIS — F3289 Other specified depressive episodes: Secondary | ICD-10-CM | POA: Insufficient documentation

## 2013-08-01 DIAGNOSIS — Z87891 Personal history of nicotine dependence: Secondary | ICD-10-CM | POA: Insufficient documentation

## 2013-08-01 DIAGNOSIS — Z794 Long term (current) use of insulin: Secondary | ICD-10-CM | POA: Insufficient documentation

## 2013-08-01 DIAGNOSIS — K279 Peptic ulcer, site unspecified, unspecified as acute or chronic, without hemorrhage or perforation: Secondary | ICD-10-CM | POA: Insufficient documentation

## 2013-08-01 DIAGNOSIS — K2970 Gastritis, unspecified, without bleeding: Secondary | ICD-10-CM

## 2013-08-01 DIAGNOSIS — Z859 Personal history of malignant neoplasm, unspecified: Secondary | ICD-10-CM | POA: Insufficient documentation

## 2013-08-01 HISTORY — DX: Malignant (primary) neoplasm, unspecified: C80.1

## 2013-08-01 HISTORY — DX: Noninfective gastroenteritis and colitis, unspecified: K52.9

## 2013-08-01 LAB — CBC WITH DIFFERENTIAL/PLATELET
Eosinophils Absolute: 0.6 10*3/uL (ref 0.0–0.7)
Eosinophils Relative: 10 % — ABNORMAL HIGH (ref 0–5)
Hemoglobin: 14 g/dL (ref 12.0–15.0)
Lymphs Abs: 1.4 10*3/uL (ref 0.7–4.0)
MCH: 28.6 pg (ref 26.0–34.0)
MCV: 85.7 fL (ref 78.0–100.0)
Monocytes Relative: 6 % (ref 3–12)
Neutrophils Relative %: 59 % (ref 43–77)
RBC: 4.9 MIL/uL (ref 3.87–5.11)

## 2013-08-01 LAB — COMPREHENSIVE METABOLIC PANEL
Alkaline Phosphatase: 103 U/L (ref 39–117)
BUN: 13 mg/dL (ref 6–23)
CO2: 24 mEq/L (ref 19–32)
GFR calc Af Amer: >90 mL/min (ref >90)
GFR calc non Af Amer: >90 mL/min (ref >90)
Glucose, Bld: 202 mg/dL — ABNORMAL HIGH (ref 70–99)
Potassium: 4.1 mEq/L (ref 3.5–5.1)
Total Protein: 7.8 g/dL (ref 6.0–8.3)

## 2013-08-01 LAB — LIPASE, BLOOD: Lipase: 20 U/L (ref 11–59)

## 2013-08-01 MED ORDER — ONDANSETRON 4 MG PO TBDP
4.0000 mg | ORAL_TABLET | Freq: Once | ORAL | Status: AC
  Administered 2013-08-01: 4 mg via ORAL
  Filled 2013-08-01: qty 1

## 2013-08-01 MED ORDER — SUCRALFATE 1 G PO TABS: 1.0000 g | ORAL_TABLET | Freq: Four times a day (QID) | ORAL | Status: DC

## 2013-08-01 MED ORDER — HYDROCODONE-ACETAMINOPHEN 5-325 MG PO TABS: 2.0000 | ORAL_TABLET | ORAL | Status: DC | PRN

## 2013-08-01 MED ORDER — ONDANSETRON 4 MG PO TBDP: 4.0000 mg | ORAL_TABLET | Freq: Three times a day (TID) | ORAL | Status: DC | PRN

## 2013-08-01 NOTE — ED Provider Notes (Signed)
CSN: [id number]     Arrival date & time 08/01/13  1406 History   First MD Initiated Contact with Patient 08/01/13 1503     Chief Complaint  Patient presents with  . Abdominal Pain  . Nausea    HPI  Patient presents with abdominal pain that she's had intermittently for the last several months. It is well localized right upper quadrant, and para-Epigastric to the right. Somewhat burning. She has early satiety and bloating. Mild nausea. Occasionally wakes her at night. Occasional emesis. No blood in her emesis. No dark or bloody stools. Has not been jaundiced. No dark urine or light stools. No excessive caffeine. No change or alcohol consumption. Nonsmoker.  Past Medical History  Diagnosis Date  . DM2 (diabetes mellitus, type 2)   . Complication of anesthesia     gas and MAC procedures  . PONV (postoperative nausea and vomiting)   . Shortness of breath     certain times of the year  . Bronchitis     hx of;last time > 60yrago  . Pneumonia     walking pneumonia in 2007  . Lung mass     left upper lobe  . Headache(784.0)     stress HA frequently  . Eczema   . GERD (gastroesophageal reflux disease)     takes Protonix nightly  . IBS (irritable bowel syndrome)   . Colon polyps     hx of  . Stress incontinence   . Diabetes mellitus     Novolog and Levemir daily  . Anxiety     takes Ativan as needed  . Depression     takes Wellbutrin daily  . Colitis   . Cancer    Past Surgical History  Procedure Laterality Date  . Caesarean section  96/98/2000  . Rectal fistula  1993  . Uterine fibroid surgery      late 30's early 40's  . Colonosocpy    . Biopsy thyroid      pt has thyroid polyps another scan in Mar 2013  . Thoracotomy  10/26/2011    Procedure: THORACOTOMY MAJOR;  Surgeon: BGaye Pollack MD;  [facility];  Service: Thoracic;  Laterality: Left;   Family History  Problem Relation Age of Onset  . Anesthesia problems Sister   . Hypotension Neg Hx   . Malignant  hyperthermia Neg Hx   . Pseudochol deficiency Neg Hx   . Hyperlipidemia Mother   . Diabetes Mother   . Cancer Mother     kidney   . Cancer Father     liver  . Breast cancer Maternal Aunt   . Heart disease Maternal Grandmother   . Cancer Maternal Grandfather     kidney   . Heart disease Maternal Grandfather   . Hyperlipidemia Maternal Grandfather   . Breast cancer Other    History  Substance Use Topics  . Smoking status: Former Smoker    Types: Cigarettes    Quit date: 08/27/1982  . Smokeless tobacco: Never Used  . Alcohol Use: No   OB History   Grav Para Term Preterm Abortions TAB SAB Ect Mult Living   5 4             Review of Systems  Constitutional: Negative for fever, chills, diaphoresis, appetite change and fatigue.  HENT: Negative for mouth sores, sore throat and trouble swallowing.   Eyes: Negative for visual disturbance.  Respiratory: Negative for cough, chest tightness, shortness of breath and wheezing.  Cardiovascular: Negative for chest pain.  Gastrointestinal: Positive for nausea, vomiting and abdominal pain. Negative for diarrhea and abdominal distention.  Endocrine: Negative for polydipsia, polyphagia and polyuria.  Genitourinary: Negative for dysuria, frequency and hematuria.  Musculoskeletal: Negative for gait problem.  Skin: Negative for color change, pallor and rash.  Neurological: Negative for dizziness, syncope, light-headedness and headaches.  Hematological: Does not bruise/bleed easily.  Psychiatric/Behavioral: Negative for behavioral problems and confusion.    Allergies  Codeine; Metoclopramide hcl; Penicillins; and Prednisone  Home Medications   Current Outpatient Rx  Name  Route  Sig  Dispense  Refill  . albuterol (PROAIR HFA) 108 (90 BASE) MCG/ACT inhaler   Inhalation   Inhale 2 puffs into the lungs every 6 (six) hours as needed. For shortness of breath         . albuterol (PROVENTIL) (2.5 MG/3ML) 0.083% nebulizer solution    Nebulization   Take 2.5 mg by nebulization every 6 (six) hours as needed. For shortness of breath         . buPROPion (WELLBUTRIN XL) 150 MG 24 hr tablet   Oral   Take 150 mg by mouth every evening. **BRAND NAME MEDICALLY NECESSARY**         . cholecalciferol (VITAMIN D) 1000 UNITS tablet   Oral   Take 10,000 Units by mouth daily.         Marland Kitchen HYDROcodone-acetaminophen (NORCO/VICODIN) 5-325 MG per tablet   Oral   Take 2 tablets by mouth every 4 (four) hours as needed.   10 tablet   0   . insulin aspart (NOVOLOG FLEXPEN) 100 UNIT/ML injection   Subcutaneous   Inject 35 Units into the skin 3 (three) times daily before meals.          . insulin detemir (LEVEMIR FLEXPEN) 100 UNIT/ML injection   Subcutaneous   Inject 48 Units into the skin 2 (two) times daily.          . Liraglutide (VICTOZA) 18 MG/3ML SOLN injection   Subcutaneous   Inject 0.6 mg into the skin daily.         Marland Kitchen LORazepam (ATIVAN) 0.5 MG tablet      take 1 tablet by mouth twice a day if needed for anxiety   30 tablet   2   . ondansetron (ZOFRAN ODT) 4 MG disintegrating tablet   Oral   Take 1 tablet (4 mg total) by mouth every 8 (eight) hours as needed for nausea.   20 tablet   0   . pantoprazole (PROTONIX) 40 MG tablet   Oral   Take 40 mg by mouth daily.         . sucralfate (CARAFATE) 1 G tablet   Oral   Take 1 tablet (1 g total) by mouth 4 (four) times daily.   60 tablet   0    BP 146/75  Pulse 73  Temp(Src) 98.1 F (36.7 C) (Oral)  Resp 18  Ht 5' 10"  (1.778 m)  Wt 270 lb (122.471 kg)  BMI 38.74 kg/m2  SpO2 98% Physical Exam  Constitutional: She is oriented to person, place, and time. She appears well-developed and well-nourished. No distress.  HENT:  Head: Normocephalic.  Eyes: Conjunctivae are normal. Pupils are equal, round, and reactive to light. No scleral icterus.  Neck: Normal range of motion. Neck supple. No thyromegaly present.  Cardiovascular: Normal rate and regular  rhythm.  Exam reveals no gallop and no friction rub.   No murmur heard. Pulmonary/Chest: Effort  normal and breath sounds normal. No respiratory distress. She has no wheezes. She has no rales.  Abdominal: Soft. Bowel sounds are normal. She exhibits no distension. There is no tenderness. There is no rebound.    Musculoskeletal: Normal range of motion.  Neurological: She is alert and oriented to person, place, and time.  Skin: Skin is warm and dry. No rash noted.  Psychiatric: She has a normal mood and affect. Her behavior is normal.    ED Course  Procedures (including critical care time) Labs Review Labs Reviewed  CBC WITH DIFFERENTIAL - Abnormal; Notable for the following:    Eosinophils Relative 10 (*)    All other components within normal limits  COMPREHENSIVE METABOLIC PANEL - Abnormal; Notable for the following:    Glucose, Bld 202 (*)    AST 43 (*)    All other components within normal limits  LIPASE, BLOOD   Imaging Review No results found.  EKG Interpretation   None       MDM   1. Gastritis   2. Peptic ulcer disease    hepatobiliary and pancreatic enzymes are normal. I discussed this with her. I recommended that she double her Protonix. I will add Carafate. Patient is an outpatient gallbladder ultrasound. If this is negative she may need EGD. She is primary care physician and GI that she follows with.    Lebron Quam, MD 08/01/13 [phone]

## 2013-08-01 NOTE — ED Notes (Addendum)
Pt having RUQ abdominal pain for two months.  Pain is worsening over time.  Now pt is having N/V.  No known fever.  Pt concerned it may be her gallbladder.  Pt also c/o sinus pain/pressure.

## 2013-08-03 ENCOUNTER — Other Ambulatory Visit (HOSPITAL_COMMUNITY): Payer: Self-pay | Admitting: Gastroenterology

## 2013-08-03 ENCOUNTER — Other Ambulatory Visit: Payer: Self-pay | Admitting: Gastroenterology

## 2013-08-03 DIAGNOSIS — R109 Unspecified abdominal pain: Secondary | ICD-10-CM

## 2013-08-03 DIAGNOSIS — R1110 Vomiting, unspecified: Secondary | ICD-10-CM

## 2013-08-03 DIAGNOSIS — R1011 Right upper quadrant pain: Secondary | ICD-10-CM

## 2013-08-04 ENCOUNTER — Other Ambulatory Visit (HOSPITAL_COMMUNITY): Payer: Self-pay | Admitting: Gastroenterology

## 2013-08-04 ENCOUNTER — Ambulatory Visit (HOSPITAL_COMMUNITY)
Admission: RE | Admit: 2013-08-04 | Discharge: 2013-08-04 | Disposition: A | Discharge status: Home / Self Care | Payer: Medicaid Other | Source: Ambulatory Visit | Attending: Gastroenterology | Admitting: Gastroenterology

## 2013-08-04 DIAGNOSIS — R1110 Vomiting, unspecified: Secondary | ICD-10-CM

## 2013-08-04 DIAGNOSIS — R1010 Upper abdominal pain, unspecified: Secondary | ICD-10-CM

## 2013-08-04 DIAGNOSIS — R1011 Right upper quadrant pain: Secondary | ICD-10-CM | POA: Insufficient documentation

## 2013-08-14 ENCOUNTER — Encounter (HOSPITAL_COMMUNITY)
Admission: RE | Admit: 2013-08-14 | Discharge: 2013-08-14 | Disposition: A | Discharge status: Home / Self Care | Payer: Medicaid Other | Source: Ambulatory Visit | Attending: Gastroenterology | Admitting: Gastroenterology

## 2013-08-14 DIAGNOSIS — R1010 Upper abdominal pain, unspecified: Secondary | ICD-10-CM

## 2013-08-14 DIAGNOSIS — R109 Unspecified abdominal pain: Secondary | ICD-10-CM | POA: Insufficient documentation

## 2013-08-14 MED ORDER — TECHNETIUM TC 99M MEBROFENIN IV KIT
5.0000 | PACK | Freq: Once | INTRAVENOUS | Status: AC | PRN
  Administered 2013-08-14: 5 via INTRAVENOUS

## 2013-08-24 ENCOUNTER — Encounter (HOSPITAL_COMMUNITY)
Admission: RE | Admit: 2013-08-24 | Discharge: 2013-08-24 | Disposition: A | Discharge status: Home / Self Care | Payer: Medicaid Other | Source: Ambulatory Visit | Attending: Gastroenterology | Admitting: Gastroenterology

## 2013-10-21 ENCOUNTER — Other Ambulatory Visit: Payer: Self-pay | Admitting: Gastroenterology

## 2013-10-26 LAB — HM COLONOSCOPY

## 2013-11-10 ENCOUNTER — Other Ambulatory Visit: Payer: Self-pay | Admitting: *Deleted

## 2013-11-10 DIAGNOSIS — R911 Solitary pulmonary nodule: Secondary | ICD-10-CM

## 2013-12-23 ENCOUNTER — Ambulatory Visit: Payer: Medicaid Other | Admitting: Surgery

## 2013-12-23 ENCOUNTER — Other Ambulatory Visit: Payer: Medicaid Other

## 2013-12-30 ENCOUNTER — Ambulatory Visit (INDEPENDENT_AMBULATORY_CARE_PROVIDER_SITE_OTHER): Payer: Medicaid Other | Admitting: Surgery

## 2013-12-30 ENCOUNTER — Encounter: Payer: Self-pay | Admitting: Surgery

## 2013-12-30 VITALS — BP 139/81 | HR 82 | Resp 16 | Ht 70.0 in | Wt 274.0 lb

## 2013-12-30 DIAGNOSIS — D3A00 Benign carcinoid tumor of unspecified site: Secondary | ICD-10-CM

## 2013-12-30 DIAGNOSIS — Z09 Encounter for follow-up examination after completed treatment for conditions other than malignant neoplasm: Secondary | ICD-10-CM

## 2013-12-30 NOTE — Progress Notes (Signed)
     HPI:  The patient returns today for routine postoperative followup status post left thoracotomy and wedge resection of a left lower lobe carcinoid tumor on 10/26/2011. This was a well-differentiated carcinoid with negative resection margins. She continues to do well with minimal left chest wall discomfort. She denies any cough or sputum production. She denies hemoptysis. She's had no headaches or visual changes. Her main complaint is of abdominal pain over the past 6 months or so that has been in the RUQ. She thought for sure it was gallstones but a nuclear medicine hepatobiliary scan was normal. She has also noted a nagging cough that feels like something is in th upper throat.   Current Outpatient Prescriptions  Medication Sig Dispense Refill  . albuterol (PROAIR HFA) 108 (90 BASE) MCG/ACT inhaler Inhale 2 puffs into the lungs every 6 (six) hours as needed. For shortness of breath      . albuterol (PROVENTIL) (2.5 MG/3ML) 0.083% nebulizer solution Take 2.5 mg by nebulization every 6 (six) hours as needed. For shortness of breath      . buPROPion (WELLBUTRIN XL) 150 MG 24 hr tablet Take 150 mg by mouth every evening. **BRAND NAME MEDICALLY NECESSARY**      . cholecalciferol (VITAMIN D) 1000 UNITS tablet Take 10,000 Units by mouth daily.      . insulin aspart (NOVOLOG FLEXPEN) 100 UNIT/ML injection Inject 35 Units into the skin 3 (three) times daily before meals.       . insulin detemir (LEVEMIR FLEXPEN) 100 UNIT/ML injection Inject 48 Units into the skin 2 (two) times daily.       Marland Kitchen LORazepam (ATIVAN) 0.5 MG tablet take 1 tablet by mouth twice a day if needed for anxiety  30 tablet  2  . metFORMIN (GLUMETZA) 500 MG (MOD) 24 hr tablet Take 500 mg by mouth 2 (two) times daily with a meal.      . pantoprazole (PROTONIX) 40 MG tablet Take 40 mg by mouth daily.      . sucralfate (CARAFATE) 1 G tablet Take 1 tablet (1 g total) by mouth 4 (four) times daily.  60 tablet  0   No current  facility-administered medications for this visit.     Physical Exam: BP 139/81  Pulse 82  Resp 16  Ht 5' 10"  (1.778 m)  Wt 274 lb (124.286 kg)  BMI 39.32 kg/m2  SpO2 98% She looks well.  There is no cervical or supraclavicular adenopathy.  Lungs are clear.  The left chest incision is well-healed. There are no skin lesions.    Diagnostic Tests:  Her recent chest CT was done at [facility] a few weeks ago and was reviewed by me. There is no sign of recurrent Carcinoid. The trachea and major airways are widely patent. The thyroid has a small echolucency in the right lobe inferiorly and posteriorly.  Impression:    She continues to do well following wedge resection of a left lower lobe carcinoid tumor.    Plan:   I will see her back in 6 months with a CXR.

## 2014-01-11 ENCOUNTER — Other Ambulatory Visit: Payer: Self-pay

## 2014-01-11 DIAGNOSIS — Z1231 Encounter for screening mammogram for malignant neoplasm of breast: Secondary | ICD-10-CM

## 2014-01-29 ENCOUNTER — Ambulatory Visit
Admission: RE | Admit: 2014-01-29 | Discharge: 2014-01-29 | Disposition: A | Discharge status: Home / Self Care | Payer: Medicaid Other | Source: Ambulatory Visit

## 2014-01-29 ENCOUNTER — Encounter (INDEPENDENT_AMBULATORY_CARE_PROVIDER_SITE_OTHER): Payer: Self-pay

## 2014-01-29 DIAGNOSIS — Z1231 Encounter for screening mammogram for malignant neoplasm of breast: Secondary | ICD-10-CM

## 2014-06-28 ENCOUNTER — Encounter: Payer: Self-pay | Admitting: Surgery

## 2014-07-14 ENCOUNTER — Ambulatory Visit: Payer: Medicaid Other | Admitting: Surgery

## 2014-07-20 ENCOUNTER — Other Ambulatory Visit: Payer: Self-pay | Admitting: Surgery

## 2014-07-20 DIAGNOSIS — R911 Solitary pulmonary nodule: Secondary | ICD-10-CM

## 2014-07-21 ENCOUNTER — Ambulatory Visit: Payer: Medicaid Other | Admitting: Surgery

## 2014-07-28 ENCOUNTER — Encounter: Payer: Self-pay | Admitting: Surgery

## 2014-07-28 ENCOUNTER — Ambulatory Visit (INDEPENDENT_AMBULATORY_CARE_PROVIDER_SITE_OTHER): Payer: Medicaid Other | Admitting: Surgery

## 2014-07-28 ENCOUNTER — Ambulatory Visit
Admission: RE | Admit: 2014-07-28 | Discharge: 2014-07-28 | Disposition: A | Discharge status: Home / Self Care | Payer: Medicaid Other | Source: Ambulatory Visit | Attending: Surgery | Admitting: Surgery

## 2014-07-28 VITALS — BP 147/81 | HR 69 | Resp 16 | Ht 70.0 in | Wt 274.0 lb

## 2014-07-28 DIAGNOSIS — R911 Solitary pulmonary nodule: Secondary | ICD-10-CM

## 2014-07-28 DIAGNOSIS — D3A00 Benign carcinoid tumor of unspecified site: Secondary | ICD-10-CM

## 2014-07-30 ENCOUNTER — Encounter: Payer: Self-pay | Admitting: Surgery

## 2014-07-30 NOTE — Progress Notes (Signed)
      HPI:  The patient returns today for routine postoperative followup status post left thoracotomy and wedge resection of a left lower lobe carcinoid tumor on 10/26/2011. This was a well-differentiated carcinoid with negative resection margins. She continues to do well with minimal left chest wall discomfort. She denies any cough or sputum production.  Current Outpatient Prescriptions  Medication Sig Dispense Refill  . albuterol (PROAIR HFA) 108 (90 BASE) MCG/ACT inhaler Inhale 2 puffs into the lungs every 6 (six) hours as needed. For shortness of breath    . albuterol (PROVENTIL) (2.5 MG/3ML) 0.083% nebulizer solution Take 2.5 mg by nebulization every 6 (six) hours as needed. For shortness of breath    . buPROPion (WELLBUTRIN XL) 150 MG 24 hr tablet Take 150 mg by mouth every evening. **BRAND NAME MEDICALLY NECESSARY**    . cholecalciferol (VITAMIN D) 1000 UNITS tablet Take 10,000 Units by mouth daily.    . insulin aspart (NOVOLOG FLEXPEN) 100 UNIT/ML injection Inject 35 Units into the skin 3 (three) times daily before meals.     . insulin detemir (LEVEMIR FLEXPEN) 100 UNIT/ML injection Inject 48 Units into the skin 2 (two) times daily.     Marland Kitchen LORazepam (ATIVAN) 0.5 MG tablet take 1 tablet by mouth twice a day if needed for anxiety 30 tablet 2  . metFORMIN (GLUMETZA) 500 MG (MOD) 24 hr tablet Take 500 mg by mouth 2 (two) times daily with a meal.    . pantoprazole (PROTONIX) 40 MG tablet Take 40 mg by mouth daily.    . sucralfate (CARAFATE) 1 G tablet Take 1 tablet (1 g total) by mouth 4 (four) times daily. 60 tablet 0   No current facility-administered medications for this visit.     Physical Exam: BP 147/81 mmHg  Pulse 69  Resp 16  Ht 5' 10"  (1.778 m)  Wt 274 lb (124.286 kg)  BMI 39.32 kg/m2  SpO2 98% She looks well.  There is no cervical or supraclavicular adenopathy.  Lungs are clear.  The left chest incision is well-healed. There are no skin lesions  Diagnostic  Tests:   CLINICAL DATA: History of pulmonary nodule in the left removed approximately 2 years ago. Shortness of breath.  EXAM: CHEST 2 VIEW  COMPARISON: CT chest 12/03/2013 and 06/10/2012. PA and lateral chest 06/10/2013 and 02/26/2012.  FINDINGS: Linear scarring left lower lobe is unchanged. The lungs are otherwise clear. Cardiac and mediastinal contours appear normal. There is no pneumothorax or pleural effusion. No bony abnormality is identified.  IMPRESSION: No acute disease. No change in linear scar in the left lower lobe.   Electronically Signed  By: Inge Rise M.D.  On: 07/28/2014 16:25     Impression:  She continues to do well following wedge resection of a left lower lobe carcinoid tumor.    Plan:   I will see her back in 6 months with a CT scan of the chest.

## 2014-09-06 ENCOUNTER — Encounter: Payer: Self-pay | Admitting: Gastroenterology

## 2015-01-18 ENCOUNTER — Emergency Department (HOSPITAL_COMMUNITY): Payer: Medicaid Other

## 2015-01-18 ENCOUNTER — Encounter (HOSPITAL_COMMUNITY): Payer: Self-pay | Admitting: Nurse Practitioner

## 2015-01-18 ENCOUNTER — Emergency Department (HOSPITAL_COMMUNITY)
Admission: EM | Admit: 2015-01-18 | Discharge: 2015-01-18 | Disposition: A | Discharge status: Home / Self Care | Payer: Medicaid Other | Attending: Emergency Medicine | Admitting: Emergency Medicine

## 2015-01-18 DIAGNOSIS — Z86010 Personal history of colonic polyps: Secondary | ICD-10-CM | POA: Diagnosis not present

## 2015-01-18 DIAGNOSIS — N23 Unspecified renal colic: Secondary | ICD-10-CM | POA: Diagnosis not present

## 2015-01-18 DIAGNOSIS — K8050 Calculus of bile duct without cholangitis or cholecystitis without obstruction: Secondary | ICD-10-CM

## 2015-01-18 DIAGNOSIS — Z880 Allergy status to penicillin: Secondary | ICD-10-CM | POA: Insufficient documentation

## 2015-01-18 DIAGNOSIS — Z85118 Personal history of other malignant neoplasm of bronchus and lung: Secondary | ICD-10-CM | POA: Insufficient documentation

## 2015-01-18 DIAGNOSIS — F329 Major depressive disorder, single episode, unspecified: Secondary | ICD-10-CM | POA: Diagnosis not present

## 2015-01-18 DIAGNOSIS — E119 Type 2 diabetes mellitus without complications: Secondary | ICD-10-CM | POA: Insufficient documentation

## 2015-01-18 DIAGNOSIS — Z794 Long term (current) use of insulin: Secondary | ICD-10-CM | POA: Diagnosis not present

## 2015-01-18 DIAGNOSIS — R1011 Right upper quadrant pain: Secondary | ICD-10-CM | POA: Diagnosis present

## 2015-01-18 DIAGNOSIS — Z87891 Personal history of nicotine dependence: Secondary | ICD-10-CM | POA: Insufficient documentation

## 2015-01-18 DIAGNOSIS — N390 Urinary tract infection, site not specified: Secondary | ICD-10-CM

## 2015-01-18 DIAGNOSIS — K219 Gastro-esophageal reflux disease without esophagitis: Secondary | ICD-10-CM | POA: Insufficient documentation

## 2015-01-18 DIAGNOSIS — R109 Unspecified abdominal pain: Secondary | ICD-10-CM

## 2015-01-18 DIAGNOSIS — E663 Overweight: Secondary | ICD-10-CM | POA: Insufficient documentation

## 2015-01-18 DIAGNOSIS — Z872 Personal history of diseases of the skin and subcutaneous tissue: Secondary | ICD-10-CM | POA: Diagnosis not present

## 2015-01-18 DIAGNOSIS — F419 Anxiety disorder, unspecified: Secondary | ICD-10-CM | POA: Insufficient documentation

## 2015-01-18 DIAGNOSIS — Z8701 Personal history of pneumonia (recurrent): Secondary | ICD-10-CM | POA: Insufficient documentation

## 2015-01-18 DIAGNOSIS — Z79899 Other long term (current) drug therapy: Secondary | ICD-10-CM | POA: Insufficient documentation

## 2015-01-18 DIAGNOSIS — Z8709 Personal history of other diseases of the respiratory system: Secondary | ICD-10-CM | POA: Diagnosis not present

## 2015-01-18 LAB — CBC WITH DIFFERENTIAL/PLATELET
Basophils Absolute: 0 10*3/uL (ref 0.0–0.1)
Basophils Relative: 1 % (ref 0–1)
EOS ABS: 0.2 10*3/uL (ref 0.0–0.7)
Eosinophils Relative: 4 % (ref 0–5)
HCT: 38.4 % (ref 36.0–46.0)
Hemoglobin: 12.8 g/dL (ref 12.0–15.0)
LYMPHS PCT: 34 % (ref 12–46)
Lymphs Abs: 1.3 10*3/uL (ref 0.7–4.0)
MCH: 27.9 pg (ref 26.0–34.0)
MCHC: 33.3 g/dL (ref 30.0–36.0)
MCV: 83.8 fL (ref 78.0–100.0)
MONO ABS: 0.3 10*3/uL (ref 0.1–1.0)
Monocytes Relative: 7 % (ref 3–12)
NEUTROS ABS: 2.2 10*3/uL (ref 1.7–7.7)
NEUTROS PCT: 54 % (ref 43–77)
PLATELETS: 137 10*3/uL — AB (ref 150–400)
RBC: 4.58 MIL/uL (ref 3.87–5.11)
RDW: 14.6 % (ref 11.5–15.5)
WBC: 4 10*3/uL (ref 4.0–10.5)

## 2015-01-18 LAB — URINALYSIS, ROUTINE W REFLEX MICROSCOPIC
Bilirubin Urine: NEGATIVE
GLUCOSE, UA: NEGATIVE mg/dL
KETONES UR: NEGATIVE mg/dL
NITRITE: NEGATIVE
Protein, ur: NEGATIVE mg/dL
SPECIFIC GRAVITY, URINE: 1.019 (ref 1.005–1.030)
Urobilinogen, UA: 0.2 mg/dL (ref 0.0–1.0)
pH: 5 (ref 5.0–8.0)

## 2015-01-18 LAB — COMPREHENSIVE METABOLIC PANEL
ALK PHOS: 106 U/L (ref 38–126)
ALT: 30 U/L (ref 14–54)
ANION GAP: 12 (ref 5–15)
AST: 28 U/L (ref 15–41)
Albumin: 3.7 g/dL (ref 3.5–5.0)
BUN: 7 mg/dL (ref 6–20)
CO2: 25 mmol/L (ref 22–32)
CREATININE: 0.7 mg/dL (ref 0.44–1.00)
Calcium: 9.4 mg/dL (ref 8.9–10.3)
Chloride: 100 mmol/L — ABNORMAL LOW (ref 101–111)
GFR calc Af Amer: >60 mL/min (ref >60)
GFR calc non Af Amer: >60 mL/min (ref >60)
Glucose, Bld: 230 mg/dL — ABNORMAL HIGH (ref 65–99)
POTASSIUM: 4 mmol/L (ref 3.5–5.1)
SODIUM: 137 mmol/L (ref 135–145)
Total Bilirubin: 0.8 mg/dL (ref 0.3–1.2)
Total Protein: 7 g/dL (ref 6.5–8.1)

## 2015-01-18 LAB — URINE MICROSCOPIC-ADD ON

## 2015-01-18 LAB — LIPASE, BLOOD: Lipase: 18 U/L — ABNORMAL LOW (ref 22–51)

## 2015-01-18 MED ORDER — KETOROLAC TROMETHAMINE 30 MG/ML IJ SOLN
30.0000 mg | Freq: Once | INTRAMUSCULAR | Status: DC
  Filled 2015-01-18: qty 1

## 2015-01-18 MED ORDER — FLUCONAZOLE 150 MG PO TABS: 150.0000 mg | ORAL_TABLET | Freq: Once | ORAL | Status: DC

## 2015-01-18 MED ORDER — HYDROCODONE-ACETAMINOPHEN 5-325 MG PO TABS: 1.0000 | ORAL_TABLET | Freq: Four times a day (QID) | ORAL | Status: DC | PRN

## 2015-01-18 MED ORDER — MORPHINE SULFATE 4 MG/ML IJ SOLN: 4.0000 mg | Freq: Once | INTRAMUSCULAR | Status: DC

## 2015-01-18 MED ORDER — ONDANSETRON HCL 4 MG/2ML IJ SOLN
4.0000 mg | Freq: Once | INTRAMUSCULAR | Status: DC
  Filled 2015-01-18: qty 2

## 2015-01-18 MED ORDER — SODIUM CHLORIDE 0.9 % IV BOLUS (SEPSIS)
1000.0000 mL | Freq: Once | INTRAVENOUS | Status: AC
  Administered 2015-01-18: 1000 mL via INTRAVENOUS

## 2015-01-18 MED ORDER — CIPROFLOXACIN HCL 500 MG PO TABS: 500.0000 mg | ORAL_TABLET | Freq: Two times a day (BID) | ORAL | Status: DC

## 2015-01-18 MED ORDER — FLUCONAZOLE 100 MG PO TABS
150.0000 mg | ORAL_TABLET | Freq: Once | ORAL | Status: AC
  Administered 2015-01-18: 150 mg via ORAL
  Filled 2015-01-18: qty 2

## 2015-01-18 MED ORDER — DEXTROSE 5 % IV SOLN
1.0000 g | Freq: Once | INTRAVENOUS | Status: AC
  Administered 2015-01-18: 1 g via INTRAVENOUS
  Filled 2015-01-18: qty 10

## 2015-01-18 MED ORDER — ONDANSETRON 4 MG PO TBDP: ORAL_TABLET | ORAL | Status: DC

## 2015-01-18 NOTE — Discharge Instructions (Signed)
Take motrin for pain.  Eat healthy, low fat diet.   Take vicodin for severe pain. Do NOT drive with it.   See surgery.   Return to ER if you have severe pain, vomiting, fever.

## 2015-01-18 NOTE — ED Provider Notes (Signed)
CSN: [id number]     Arrival date & time 01/18/15  0728 History   First MD Initiated Contact with Patient 01/18/15 [phone]     Chief Complaint  Patient presents with  . Abdominal Pain     (Consider location/radiation/quality/duration/timing/severity/associated sxs/prior Treatment) The history is provided by the patient.  Kimberly [name] is a 54 y.o. female hx of DM, pneumonia, DM, overweight, here with RUQ pain.  Patient has been having right upper quadrant pain intermittently for the last 3 days. It is worse with food. Some nausea but no vomiting. The pain does radiate to her back. Denies any urinary symptoms or hematuria. States that she has a history of gallbladder polyp but no history of gallstones or kidney stones.    Past Medical History  Diagnosis Date  . DM2 (diabetes mellitus, type 2)   . Complication of anesthesia     gas and MAC procedures  . PONV (postoperative nausea and vomiting)   . Shortness of breath     certain times of the year  . Bronchitis     hx of;last time > 32yrago  . Pneumonia     walking pneumonia in 2007  . Lung mass     left upper lobe  . Headache(784.0)     stress HA frequently  . Eczema   . GERD (gastroesophageal reflux disease)     takes Protonix nightly  . IBS (irritable bowel syndrome)   . Colon polyps     hx of  . Stress incontinence   . Diabetes mellitus     Novolog and Levemir daily  . Anxiety     takes Ativan as needed  . Depression     takes Wellbutrin daily  . Colitis   . Cancer     part of left lower lung removed- lung ca 2013   Past Surgical History  Procedure Laterality Date  . Caesarean section  96/98/2000  . Rectal fistula  1993  . Uterine fibroid surgery      late 30's early 40's  . Colonosocpy    . Biopsy thyroid      pt has thyroid polyps another scan in Mar 2013  . Thoracotomy  10/26/2011    Procedure: THORACOTOMY MAJOR;  Surgeon: BGaye Pollack MD;  [facility];  Service: Thoracic;  Laterality: Left;   Family  History  Problem Relation Age of Onset  . Anesthesia problems Sister   . Hypotension Neg Hx   . Malignant hyperthermia Neg Hx   . Pseudochol deficiency Neg Hx   . Hyperlipidemia Mother   . Diabetes Mother   . Cancer Mother     kidney   . Cancer Father     liver  . Breast cancer Maternal Aunt   . Heart disease Maternal Grandmother   . Cancer Maternal Grandfather     kidney   . Heart disease Maternal Grandfather   . Hyperlipidemia Maternal Grandfather   . Breast cancer Other    History  Substance Use Topics  . Smoking status: Former Smoker    Types: Cigarettes    Quit date: 08/27/1982  . Smokeless tobacco: Never Used  . Alcohol Use: No   OB History    Gravida Para Term Preterm AB TAB SAB Ectopic Multiple Living   5 4             Review of Systems  Gastrointestinal: Positive for abdominal pain.  Genitourinary: Positive for flank pain.  All other systems reviewed and  are negative.     Allergies  Codeine; Metoclopramide hcl; Penicillins; and Prednisone  Home Medications   Prior to Admission medications   Medication Sig Start Date End Date Taking? Authorizing Provider  albuterol (PROAIR HFA) 108 (90 BASE) MCG/ACT inhaler Inhale 2 puffs into the lungs every 6 (six) hours as needed. For shortness of breath   Yes Historical Provider, MD  albuterol (PROVENTIL) (2.5 MG/3ML) 0.083% nebulizer solution Take 2.5 mg by nebulization every 6 (six) hours as needed. For shortness of breath   Yes Historical Provider, MD  buPROPion (WELLBUTRIN XL) 150 MG 24 hr tablet Take 150 mg by mouth every evening. **BRAND NAME MEDICALLY NECESSARY**   Yes Historical Provider, MD  cholecalciferol (VITAMIN D) 1000 UNITS tablet Take 1,000 Units by mouth daily.    Yes Historical Provider, MD  ibuprofen (ADVIL,MOTRIN) 200 MG tablet Take 200 mg by mouth every 6 (six) hours as needed for moderate pain.   Yes Historical Provider, MD  insulin aspart (NOVOLOG FLEXPEN) 100 UNIT/ML injection Inject 35 Units  into the skin 3 (three) times daily before meals.    Yes Historical Provider, MD  insulin detemir (LEVEMIR FLEXPEN) 100 UNIT/ML injection Inject 48 Units into the skin 2 (two) times daily.    Yes Historical Provider, MD  LORazepam (ATIVAN) 0.5 MG tablet take 1 tablet by mouth twice a day if needed for anxiety 06/29/12  Yes Campbell Riches, MD  metFORMIN (GLUMETZA) 500 MG (MOD) 24 hr tablet Take 500-1,000 mg by mouth See admin instructions. Pt takes 1065m in the morning and 5058min the evening   Yes Historical Provider, MD  pantoprazole (PROTONIX) 40 MG tablet Take 40 mg by mouth daily.   Yes Historical Provider, MD  sucralfate (CARAFATE) 1 G tablet Take 1 tablet (1 g total) by mouth 4 (four) times daily. 08/01/13  Yes MaTanna FurryMD   BP 128/58 mmHg  Pulse 75  Temp(Src) 98.5 F (36.9 C) (Oral)  Resp 16  Ht 5' 10"  (1.778 m)  Wt 274 lb (124.286 kg)  BMI 39.32 kg/m2  SpO2 94% Physical Exam  Constitutional: She is oriented to person, place, and time.  Slightly uncomfortable   HENT:  Head: Normocephalic.  Mouth/Throat: Oropharynx is clear and moist.  Eyes: Conjunctivae are normal. Pupils are equal, round, and reactive to light.  Neck: Normal range of motion.  Cardiovascular: Normal rate, regular rhythm and normal heart sounds.   Pulmonary/Chest: Effort normal and breath sounds normal. No respiratory distress. She has no wheezes. She has no rales.  Abdominal: Soft. Bowel sounds are normal.  + RUQ tenderness, mild murphy's. + R CVAT   Musculoskeletal: Normal range of motion. She exhibits no edema or tenderness.  Neurological: She is alert and oriented to person, place, and time. No cranial nerve deficit. Coordination normal.  Skin: Skin is warm and dry.  Psychiatric: She has a normal mood and affect. Her behavior is normal. Judgment and thought content normal.  Nursing note and vitals reviewed.   ED Course  Procedures (including critical care time) Labs Review Labs Reviewed  CBC  WITH DIFFERENTIAL/PLATELET - Abnormal; Notable for the following:    Platelets 137 (*)    All other components within normal limits  COMPREHENSIVE METABOLIC PANEL - Abnormal; Notable for the following:    Chloride 100 (*)    Glucose, Bld 230 (*)    All other components within normal limits  LIPASE, BLOOD - Abnormal; Notable for the following:    Lipase 18 (*)  All other components within normal limits  URINALYSIS, ROUTINE W REFLEX MICROSCOPIC - Abnormal; Notable for the following:    APPearance CLOUDY (*)    Hgb urine dipstick SMALL (*)    Leukocytes, UA LARGE (*)    All other components within normal limits  URINE MICROSCOPIC-ADD ON - Abnormal; Notable for the following:    Squamous Epithelial / LPF FEW (*)    Bacteria, UA MANY (*)    All other components within normal limits    Imaging Review US Abdomen Complete  01/18/2015   CLINICAL DATA:  Abdominal pain.  EXAM: ULTRASOUND ABDOMEN COMPLETE  COMPARISON:  Nuclear medicine scintigraphy of the hepato biliary tree 08/14/13. All study abdomen 08/04/2013.  FINDINGS: Gallbladder: Multiple gallstones are present which cast is acoustical shadows. There is an incidental 6 mm polyp which is non shadowing, noted previously. No gallbladder wall thickening. Negative sonographic Murphy's sign.  Common bile duct: Diameter: Normal measuring 3 mm  Liver: No focal lesion identified. Increased echogenicity consistent with steatosis.  IVC: No abnormality visualized.  Pancreas: Visualized portion unremarkable.  Spleen: Increased in size. Transverse measurement of 14.5 cm. This was noted previously.  Right Kidney: Length: 13.3. Echogenicity within normal limits. No mass or hydronephrosis visualized.  Left Kidney: Length: 13.0. Echogenicity within normal limits. No mass or hydronephrosis visualized.  Abdominal aorta: No aneurysm visualized.  Other findings: None.  IMPRESSION: Splenomegaly.  Multiple gallstones without signs of acute cholecystitis. Incidental  gallbladder wall polyp. No biliary ductal dilatation.  Otherwise stable exam.   Electronically Signed   By: Rolla Flatten M.D.   On: 01/18/2015 09:22     EKG Interpretation None      MDM   Final diagnoses:  Abdominal pain    Kimberly [name] is a 54 y.o. female here with RUQ pain. Likely chole vs renal colic vs pyelo. Will get labs, [place], UA.   10:46 AM  UA + UTI. US showed gallstones with acute chole. Didn't want pain meds. Given abx. Will dc home with cipro, pain meds, surgery f/u.     Wandra Arthurs, MD 01/18/15 [phone]

## 2015-01-18 NOTE — ED Notes (Signed)
Patient endorses RUQ pain that started on Sunday and has been constant dullness with intermittent sharp stabbing pains throughout the day. Patient sts felt tight band around right sided that started in back and wrapped around upper abdomen. Pt sts pain is worse after eating. Patient has been eating bland diet since then and pain has still been present. Patient concerned for gall bladder. Pt denies N/V/D, dysuria or changed in frequency of urination.

## 2015-01-18 NOTE — ED Notes (Signed)
Dr. Darl Householder at bedside updating patient on results.

## 2015-01-27 ENCOUNTER — Other Ambulatory Visit: Payer: Self-pay | Admitting: Surgery

## 2015-01-27 DIAGNOSIS — R911 Solitary pulmonary nodule: Secondary | ICD-10-CM

## 2015-01-28 ENCOUNTER — Other Ambulatory Visit: Payer: Self-pay

## 2015-01-28 DIAGNOSIS — Z1231 Encounter for screening mammogram for malignant neoplasm of breast: Secondary | ICD-10-CM

## 2015-01-31 ENCOUNTER — Ambulatory Visit
Admission: RE | Admit: 2015-01-31 | Discharge: 2015-01-31 | Disposition: A | Discharge status: Home / Self Care | Payer: Medicaid Other | Source: Ambulatory Visit

## 2015-01-31 ENCOUNTER — Other Ambulatory Visit: Payer: Self-pay | Admitting: Surgery

## 2015-01-31 DIAGNOSIS — Z1231 Encounter for screening mammogram for malignant neoplasm of breast: Secondary | ICD-10-CM

## 2015-02-03 ENCOUNTER — Other Ambulatory Visit: Payer: Self-pay | Admitting: Surgery

## 2015-02-04 ENCOUNTER — Ambulatory Visit (INDEPENDENT_AMBULATORY_CARE_PROVIDER_SITE_OTHER): Payer: Medicaid Other | Admitting: Surgery

## 2015-02-04 ENCOUNTER — Ambulatory Visit
Admission: RE | Admit: 2015-02-04 | Discharge: 2015-02-04 | Disposition: A | Discharge status: Home / Self Care | Payer: Medicaid Other | Source: Ambulatory Visit | Attending: Surgery | Admitting: Surgery

## 2015-02-04 ENCOUNTER — Encounter: Payer: Self-pay | Admitting: Surgery

## 2015-02-04 VITALS — BP 125/74 | HR 71 | Resp 20 | Ht 70.0 in | Wt 274.0 lb

## 2015-02-04 DIAGNOSIS — R911 Solitary pulmonary nodule: Secondary | ICD-10-CM

## 2015-02-04 DIAGNOSIS — D3A00 Benign carcinoid tumor of unspecified site: Secondary | ICD-10-CM

## 2015-02-04 MED ORDER — PROAIR HFA 108 (90 BASE) MCG/ACT IN AERS: 2.0000 | INHALATION_SPRAY | Freq: Four times a day (QID) | RESPIRATORY_TRACT | Status: DC | PRN

## 2015-02-04 NOTE — Progress Notes (Signed)
HPI:  The patient returns today for routine surveillance followup status post left thoracotomy and wedge resection of a left lower lobe carcinoid tumor on 10/26/2011. This was a well-differentiated carcinoid with negative resection margins. She is doing well overall but says she currently has a URI. She denies any fever or chills.    Current Outpatient Prescriptions  Medication Sig Dispense Refill  . albuterol (PROVENTIL) (2.5 MG/3ML) 0.083% nebulizer solution Take 2.5 mg by nebulization every 6 (six) hours as needed. For shortness of breath    . buPROPion (WELLBUTRIN XL) 150 MG 24 hr tablet Take 150 mg by mouth every evening. **BRAND NAME MEDICALLY NECESSARY**    . cholecalciferol (VITAMIN D) 1000 UNITS tablet Take 1,000 Units by mouth daily.     . fluconazole (DIFLUCAN) 150 MG tablet Take 1 tablet (150 mg total) by mouth once. 1 tablet 0  . HYDROcodone-acetaminophen (NORCO/VICODIN) 5-325 MG per tablet Take 1-2 tablets by mouth every 6 (six) hours as needed. 10 tablet 0  . ibuprofen (ADVIL,MOTRIN) 200 MG tablet Take 200 mg by mouth every 6 (six) hours as needed for moderate pain.    Marland Kitchen insulin aspart (NOVOLOG FLEXPEN) 100 UNIT/ML injection Inject 35 Units into the skin 3 (three) times daily before meals.     . insulin detemir (LEVEMIR FLEXPEN) 100 UNIT/ML injection Inject 48 Units into the skin 2 (two) times daily.     Marland Kitchen LORazepam (ATIVAN) 0.5 MG tablet take 1 tablet by mouth twice a day if needed for anxiety 30 tablet 2  . metFORMIN (GLUMETZA) 500 MG (MOD) 24 hr tablet Take 500-1,000 mg by mouth See admin instructions. Pt takes 1058m in the morning and 50648min the evening    . ondansetron (ZOFRAN ODT) 4 MG disintegrating tablet 48m46mDT q4 hours prn nausea/vomit 8 tablet 0  . pantoprazole (PROTONIX) 40 MG tablet Take 40 mg by mouth daily.    . PMarland KitchenOAIR HFA 108 (90 BASE) MCG/ACT inhaler Inhale 2 puffs into the lungs every 6 (six) hours as needed. For shortness of breath 1 Inhaler 6  .  sucralfate (CARAFATE) 1 G tablet Take 1 tablet (1 g total) by mouth 4 (four) times daily. 60 tablet 0   No current facility-administered medications for this visit.     Physical Exam: BP 125/74 mmHg  Pulse 71  Resp 20  Ht 5' 10"  (1.778 m)  Wt 274 lb (124.286 kg)  BMI 39.32 kg/m2  SpO2 98% She looks well.  There is no cervical or supraclavicular adenopathy.  Lungs are clear.  The left chest incision is well-healed. There are no skin lesions  Diagnostic Tests:   CLINICAL DATA: Nodule of the left lung. Left lower lobe carcinoid tumor resection in 2013. Former smoker.  EXAM: CT CHEST WITHOUT CONTRAST  TECHNIQUE: Multidetector CT imaging of the chest was performed following the standard protocol without IV contrast.  COMPARISON: 12/03/2013  FINDINGS: THORACIC INLET/BODY WALL:  Sub cm nodule in the right posterior thyroid gland is stable from prior.  MEDIASTINUM:  Normal heart size. No pericardial effusion. No acute vascular abnormality. No adenopathy.  LUNG WINDOWS:  4 mm pulmonary nodule in the right upper lobe on image 26 which was not seen in 2015. A subpleural nodule on image 28 in the right lower lobe is stable. Stable scarring in the left lower lobe related to lung cancer resection. No nodularity to suggest recurrence. No pleural effusion.  UPPER ABDOMEN:  Hepatic steatosis.  OSSEOUS:  No acute fracture.  No suspicious lytic or blastic lesions.  IMPRESSION: 1. Stable postsurgical changes at the left base. No evidence of recurrence. 2. New 4 mm pulmonary nodule in the right upper lobe. Given risk factors for bronchogenic carcinoma, follow-up chest CT at 1 year is recommended. This recommendation follows the consensus statement: Guidelines for Management of Small Pulmonary Nodules Detected on CT Scans: A Statement from the [place] as published in Radiology 2005; 237:395-400.   Electronically Signed  By: Monte Fantasia M.D.  On: 02/04/2015 12:35     Impression:  She continues to do well following wedge resection of a left lower lobe carcinoid tumor. There is a new 4 nodule in the right upper lobe that is too small to characterize further. This will require continued follow up. I reviewed the CT scan with her and compared it to her prior CT last year. All of her questions have been answered.   Plan:   I will see her back in one year with a CT scan of the chest.   Gaye Pollack, MD [facility] [phone]

## 2015-02-09 ENCOUNTER — Ambulatory Visit: Payer: Medicaid Other | Admitting: Surgery

## 2015-02-09 ENCOUNTER — Other Ambulatory Visit: Payer: Medicaid Other

## 2015-02-21 ENCOUNTER — Other Ambulatory Visit: Payer: Self-pay

## 2015-07-18 ENCOUNTER — Other Ambulatory Visit: Payer: Self-pay | Admitting: Endocrinology

## 2015-07-18 DIAGNOSIS — E042 Nontoxic multinodular goiter: Secondary | ICD-10-CM

## 2015-07-20 ENCOUNTER — Other Ambulatory Visit: Payer: Medicaid Other

## 2015-07-25 ENCOUNTER — Ambulatory Visit
Admission: RE | Admit: 2015-07-25 | Discharge: 2015-07-25 | Disposition: A | Discharge status: Home / Self Care | Payer: Medicaid Other | Source: Ambulatory Visit | Attending: Endocrinology | Admitting: Endocrinology

## 2015-07-25 DIAGNOSIS — E042 Nontoxic multinodular goiter: Secondary | ICD-10-CM

## 2015-10-28 DIAGNOSIS — I10 Essential (primary) hypertension: Secondary | ICD-10-CM | POA: Insufficient documentation

## 2015-10-28 DIAGNOSIS — E042 Nontoxic multinodular goiter: Secondary | ICD-10-CM | POA: Insufficient documentation

## 2015-10-28 DIAGNOSIS — E559 Vitamin D deficiency, unspecified: Secondary | ICD-10-CM

## 2015-10-28 DIAGNOSIS — Z794 Long term (current) use of insulin: Secondary | ICD-10-CM

## 2015-10-28 DIAGNOSIS — E7800 Pure hypercholesterolemia, unspecified: Secondary | ICD-10-CM

## 2015-10-28 DIAGNOSIS — E119 Type 2 diabetes mellitus without complications: Secondary | ICD-10-CM

## 2015-10-28 HISTORY — DX: Nontoxic multinodular goiter: E04.2

## 2015-10-28 HISTORY — DX: Essential (primary) hypertension: I10

## 2015-10-28 HISTORY — DX: Pure hypercholesterolemia, unspecified: E78.00

## 2015-10-28 HISTORY — DX: Type 2 diabetes mellitus without complications: Z79.4

## 2015-10-28 HISTORY — DX: Vitamin D deficiency, unspecified: E55.9

## 2015-10-28 HISTORY — DX: Type 2 diabetes mellitus without complications: E11.9

## 2015-11-10 DIAGNOSIS — G8929 Other chronic pain: Secondary | ICD-10-CM | POA: Insufficient documentation

## 2015-11-10 DIAGNOSIS — M25552 Pain in left hip: Secondary | ICD-10-CM | POA: Insufficient documentation

## 2015-11-11 ENCOUNTER — Ambulatory Visit
Admission: RE | Admit: 2015-11-11 | Discharge: 2015-11-11 | Disposition: A | Discharge status: Home / Self Care | Payer: Medicaid Other | Source: Ambulatory Visit | Attending: Medical Oncology | Admitting: Medical Oncology

## 2015-11-11 ENCOUNTER — Other Ambulatory Visit: Payer: Self-pay | Admitting: Medical Oncology

## 2015-11-11 DIAGNOSIS — M25552 Pain in left hip: Principal | ICD-10-CM

## 2015-11-11 DIAGNOSIS — G8929 Other chronic pain: Secondary | ICD-10-CM

## 2016-01-26 ENCOUNTER — Other Ambulatory Visit: Payer: Self-pay | Admitting: Surgery

## 2016-01-26 DIAGNOSIS — R918 Other nonspecific abnormal finding of lung field: Secondary | ICD-10-CM

## 2016-01-27 DIAGNOSIS — H9319 Tinnitus, unspecified ear: Secondary | ICD-10-CM

## 2016-01-27 HISTORY — DX: Tinnitus, unspecified ear: H93.19

## 2016-02-10 IMAGING — CT CT CHEST W/O CM
1 series · 15 of 31 positions shown, 19 images · non-contrast
Comparison: 12/03/2013

CLINICAL DATA: Nodule of the left lung. Left lower lobe carcinoid
tumor resection in 7663. Former smoker.

EXAM:
CT CHEST WITHOUT CONTRAST
TECHNIQUE: Multidetector CT imaging of the chest was performed following the
standard protocol without IV contrast.

[Series 3: chest 5.0 i41s 1 · axial · 0.70mm/px · z∈[-396,-96]mm · 15 of 66 slices shown, 19 images]
[im 3/66  mediastinal]
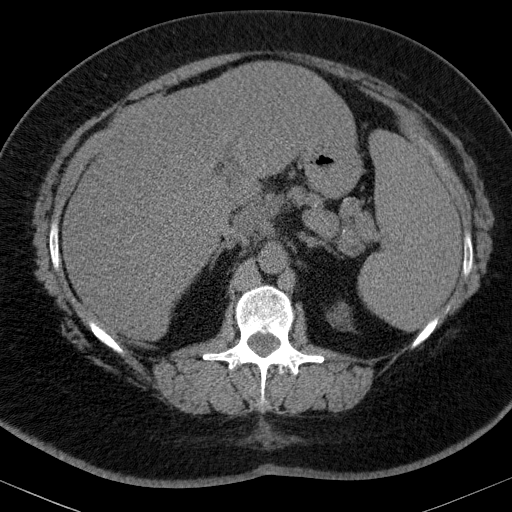
[im 3/66  lung]
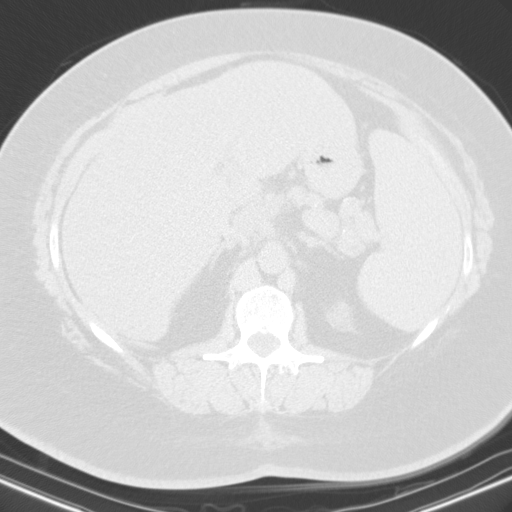
[im 8/66  lung]
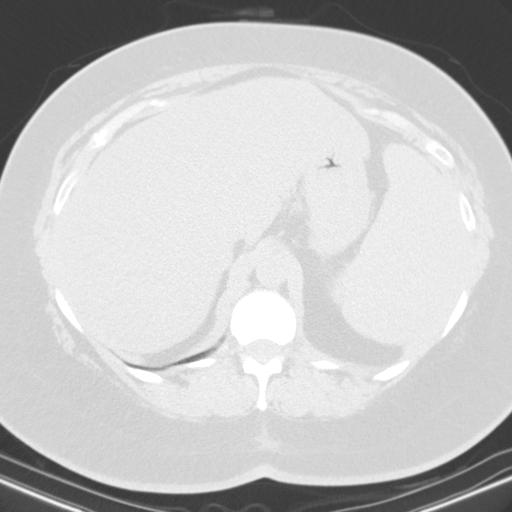
[im 13/66  lung]
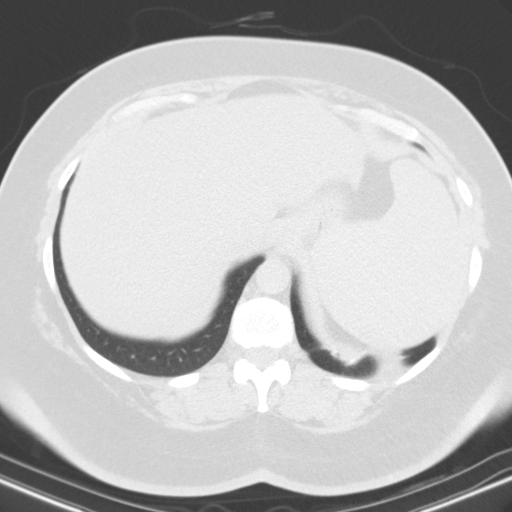
[im 15/66  lung]
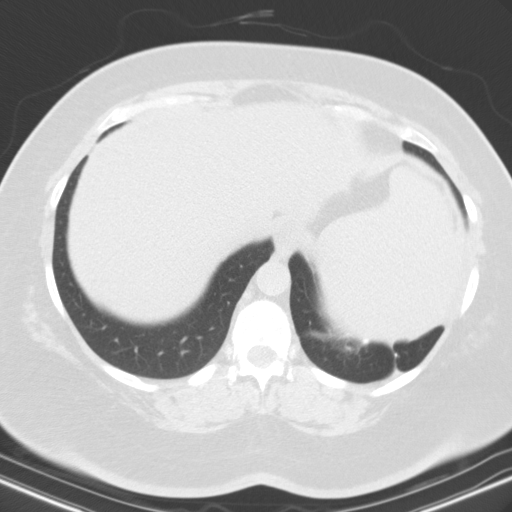
[im 20/66  mediastinal]
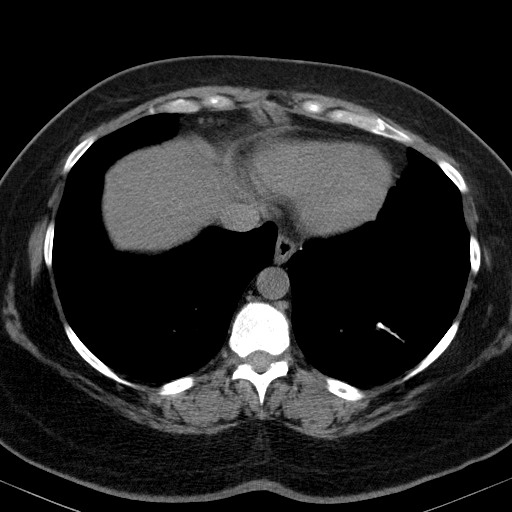
[im 20/66  lung]
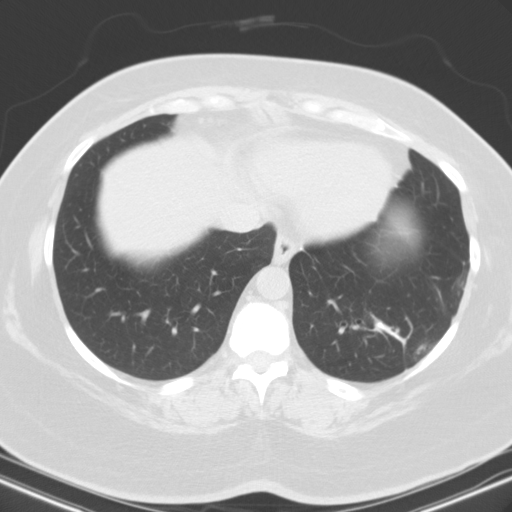
[im 25/66  lung]
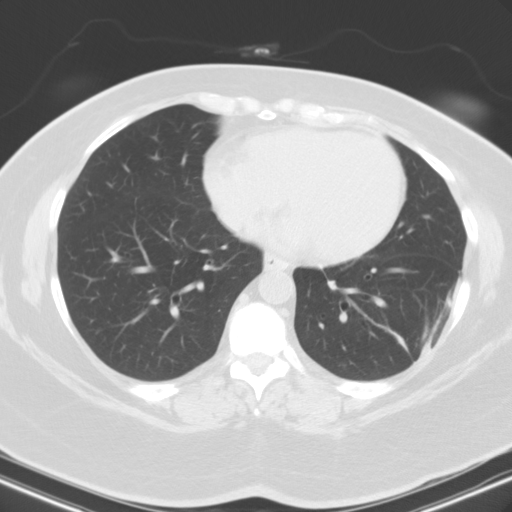
[im 29/66  lung]
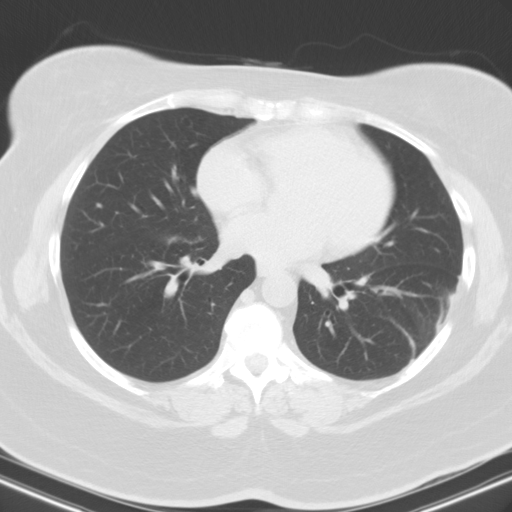
[im 34/66  lung]
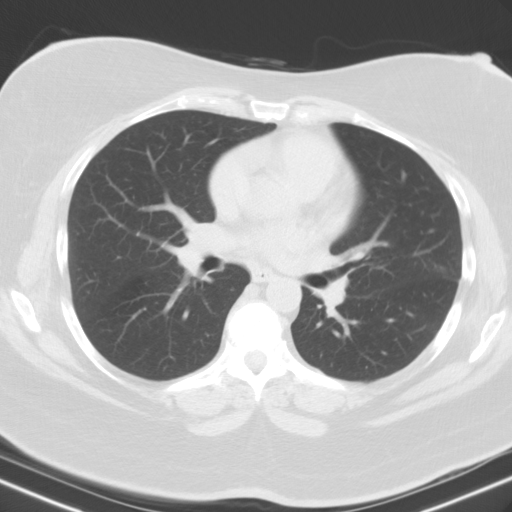
[im 37/66  mediastinal]
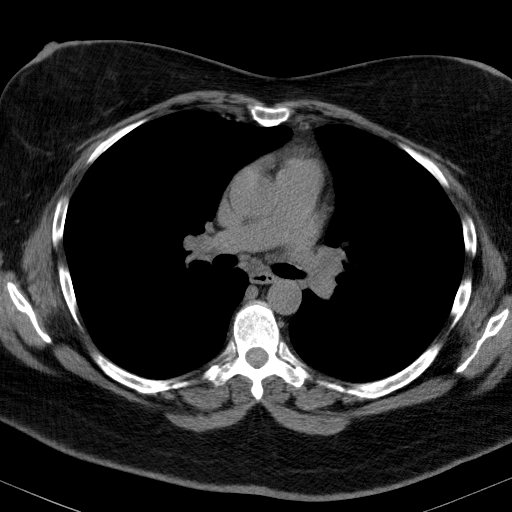
[im 37/66  lung]
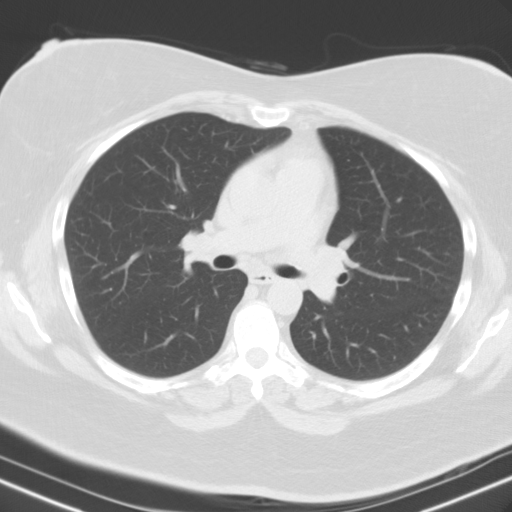
[im 40/66  lung]
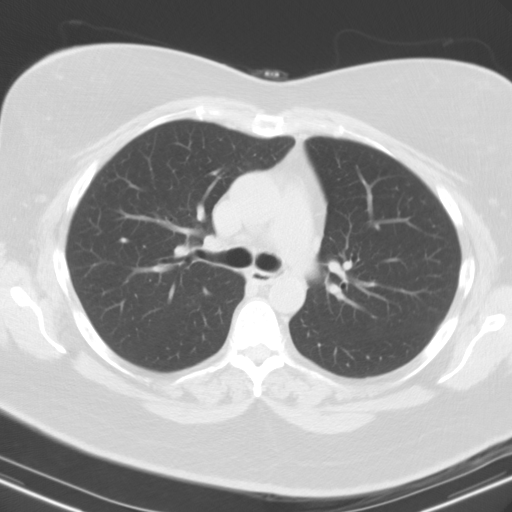
[im 44/66  lung]
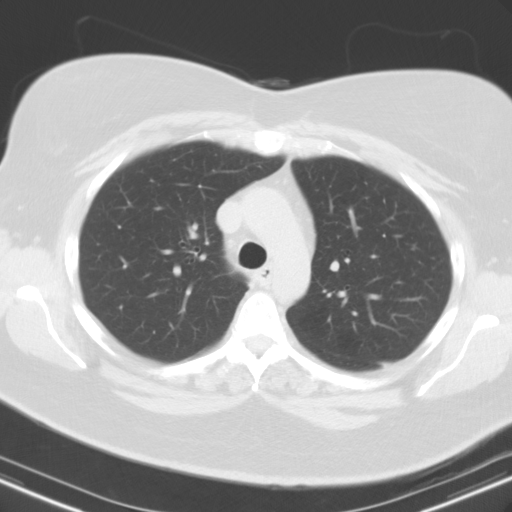
[im 49/66  lung]
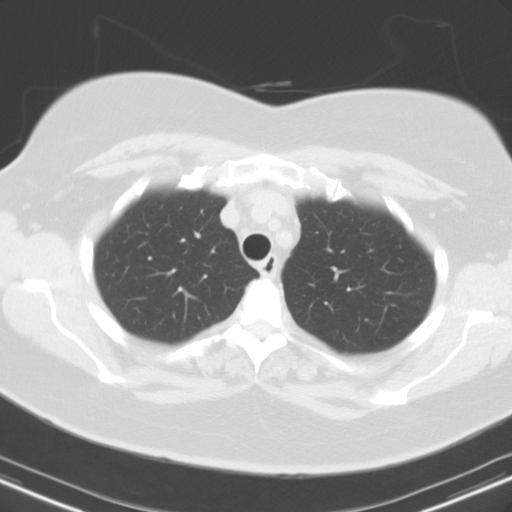
[im 53/66  mediastinal]
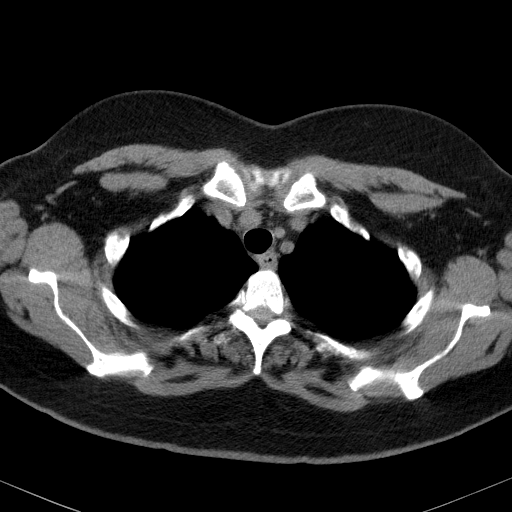
[im 53/66  lung]
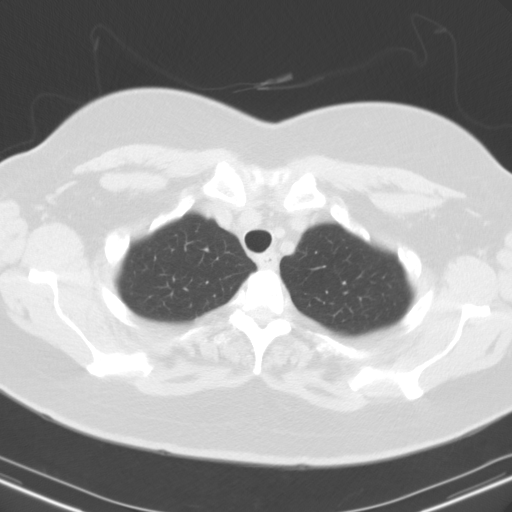
[im 58/66  lung]
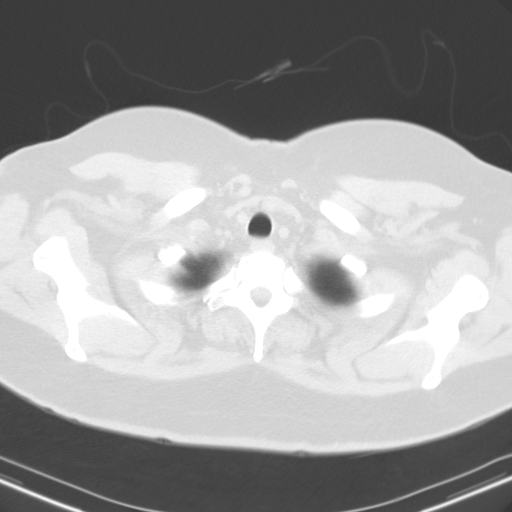
[im 63/66  lung]
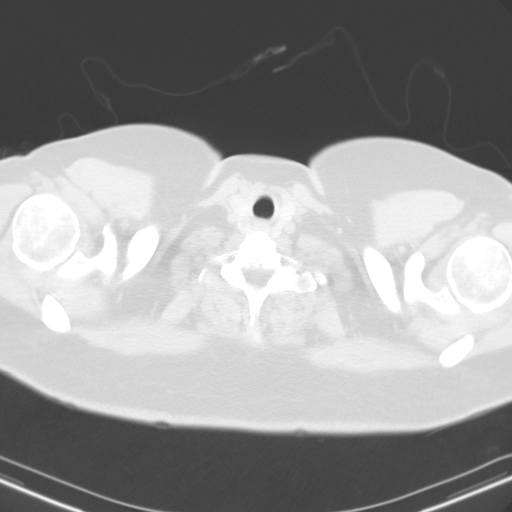

[15 of 31 positions shown; findings below may reference images not displayed]

FINDINGS: THORACIC INLET/BODY WALL:

Sub cm nodule in the right posterior thyroid gland is stable from
prior.

MEDIASTINUM:

Normal heart size. No pericardial effusion. No acute vascular
abnormality. No adenopathy.

LUNG WINDOWS:

4 mm pulmonary nodule in the right upper lobe on image 26 which was
not seen in 0315. A subpleural nodule on image 28 in the right lower
lobe is stable. Stable scarring in the left lower lobe related to
lung cancer resection. No nodularity to suggest recurrence. No
pleural effusion.

UPPER ABDOMEN:

Hepatic steatosis.

OSSEOUS:

No acute fracture.  No suspicious lytic or blastic lesions.
IMPRESSION: 1. Stable postsurgical changes at the left base. No evidence of
recurrence.
2. New 4 mm pulmonary nodule in the right upper lobe. Given risk
factors for bronchogenic carcinoma, follow-up chest CT at 1 year is
recommended. This recommendation follows the consensus statement:
Guidelines for Management of Small Pulmonary Nodules Detected on CT
Scans: A Statement from the [HOSPITAL] as published in

## 2016-02-15 ENCOUNTER — Ambulatory Visit (INDEPENDENT_AMBULATORY_CARE_PROVIDER_SITE_OTHER): Payer: Medicaid Other | Admitting: Surgery

## 2016-02-15 ENCOUNTER — Ambulatory Visit
Admission: RE | Admit: 2016-02-15 | Discharge: 2016-02-15 | Disposition: A | Discharge status: Home / Self Care | Payer: Medicaid Other | Source: Ambulatory Visit | Attending: Surgery | Admitting: Surgery

## 2016-02-15 ENCOUNTER — Encounter: Payer: Self-pay | Admitting: Surgery

## 2016-02-15 VITALS — BP 140/70 | HR 76 | Resp 20 | Ht 70.0 in | Wt 274.0 lb

## 2016-02-15 DIAGNOSIS — T17308A Unspecified foreign body in larynx causing other injury, initial encounter: Secondary | ICD-10-CM | POA: Diagnosis not present

## 2016-02-15 DIAGNOSIS — R911 Solitary pulmonary nodule: Secondary | ICD-10-CM | POA: Diagnosis not present

## 2016-02-15 DIAGNOSIS — R918 Other nonspecific abnormal finding of lung field: Secondary | ICD-10-CM

## 2016-02-15 DIAGNOSIS — D3A00 Benign carcinoid tumor of unspecified site: Secondary | ICD-10-CM

## 2016-02-15 DIAGNOSIS — K219 Gastro-esophageal reflux disease without esophagitis: Secondary | ICD-10-CM

## 2016-02-15 HISTORY — DX: Gastro-esophageal reflux disease without esophagitis: K21.9

## 2016-02-15 NOTE — Progress Notes (Signed)
HPI:  The patient returns today for routine surveillance followup status post left thoracotomy and wedge resection of a left lower lobe carcinoid tumor on 10/26/2011. This was a well-differentiated carcinoid with negative resection margins. I saw her one year ago and CT showed a new 4 mm nodule in the RUL that was too small to characterize further. Her main complaint is related to silent reflux that has been a chronic problem for her with bronchitis.  Current Outpatient Prescriptions  Medication Sig Dispense Refill  . albuterol (PROVENTIL) (2.5 MG/3ML) 0.083% nebulizer solution Take 2.5 mg by nebulization every 6 (six) hours as needed. For shortness of breath    . buPROPion (WELLBUTRIN XL) 150 MG 24 hr tablet Take 150 mg by mouth every evening. **BRAND NAME MEDICALLY NECESSARY**    . cholecalciferol (VITAMIN D) 1000 UNITS tablet Take 1,000 Units by mouth daily.     Marland Kitchen ibuprofen (ADVIL,MOTRIN) 200 MG tablet Take 200 mg by mouth every 6 (six) hours as needed for moderate pain.    Marland Kitchen ibuprofen (ADVIL,MOTRIN) 400 MG tablet Take 400 mg by mouth every 6 (six) hours as needed.    . insulin aspart (NOVOLOG FLEXPEN) 100 UNIT/ML injection Inject 35 Units into the skin 3 (three) times daily before meals.     . insulin detemir (LEVEMIR FLEXPEN) 100 UNIT/ML injection Inject 48 Units into the skin 2 (two) times daily.     Marland Kitchen LORazepam (ATIVAN) 0.5 MG tablet take 1 tablet by mouth twice a day if needed for anxiety 30 tablet 2  . meclizine (ANTIVERT) 12.5 MG tablet Take 12.5 mg by mouth 3 (three) times daily as needed for dizziness.    . metFORMIN (GLUMETZA) 500 MG (MOD) 24 hr tablet Take 500-1,000 mg by mouth See admin instructions. Pt takes 1059m in the morning and 5055min the evening    . ondansetron (ZOFRAN ODT) 4 MG disintegrating tablet 95m62mDT q4 hours prn nausea/vomit 8 tablet 0  . pantoprazole (PROTONIX) 40 MG tablet Take 40 mg by mouth daily.    . PMarland KitchenOAIR HFA 108 (90 BASE) MCG/ACT inhaler Inhale 2  puffs into the lungs every 6 (six) hours as needed. For shortness of breath 1 Inhaler 6  . sucralfate (CARAFATE) 1 G tablet Take 1 tablet (1 g total) by mouth 4 (four) times daily. 60 tablet 0   No current facility-administered medications for this visit.     Physical Exam: BP 140/70 mmHg  Pulse 76  Resp 20  Ht 5' 10"  (1.778 m)  Wt 274 lb (124.286 kg)  BMI 39.32 kg/m2  SpO2 98% She looks well.  There is no cervical or supraclavicular adenopathy.  Lungs are clear.  The left chest incision is well-healed. There are no skin lesions  Diagnostic Tests:  CLINICAL DATA: Followup indeterminate right lung nodule. Previous surgical resection of left lower lobe carcinoid tumor in 2013. Former smoker.  EXAM: CT CHEST WITHOUT CONTRAST  TECHNIQUE: Multidetector CT imaging of the chest was performed following the standard protocol without IV contrast.  COMPARISON: 02/04/2015  FINDINGS: Mediastinum/Lymph Nodes: No masses or pathologically enlarged lymph nodes identified on this un-enhanced exam.  Lungs/Pleura: Left lower lobe scarring remains stable. Previously seen 4 mm right upper lobe pulmonary nodule is no longer visualized on today's exam, consistent with resolving inflammatory or infectious etiology. No suspicious pulmonary nodules or masses are identified. No evidence of pulmonary infiltrate or pleural effusion.  Upper abdomen: Although incompletely visualized on this study, the liver appears to have  a cirrhotic configuration and spleen appears enlarged.  Musculoskeletal: No chest wall mass or suspicious bone lesions identified.  IMPRESSION: No suspicious pulmonary nodules or masses identified. Interval resolution of tiny right upper lobe pulmonary nodule, consistent with resolving inflammatory or infectious etiology. No acute findings identified.  Although incompletely visualized on this study, hepatic cirrhosis and splenomegaly are suspected.  Recommend correlation with liver function tests ; consider abdomen MRI without and with contrast for further evaluation if clinically warranted.   Electronically Signed  By: Earle Gell M.D.  On: 02/15/2016 10:34  Impression:  The CT scan shows resolution of the 4 mm RUL nodule consistent with an infectious or inflammatory etiology, possibly related to her chronic reflux. There is no evidence of recurrence of her carcinoid tumor.  Plan:  I will see her back in one year with a repeat CT scan of the chest.   Gaye Pollack, MD [facility] [phone]

## 2016-03-01 ENCOUNTER — Other Ambulatory Visit: Payer: Self-pay | Admitting: Family Medicine

## 2016-03-01 DIAGNOSIS — G43809 Other migraine, not intractable, without status migrainosus: Secondary | ICD-10-CM

## 2016-03-02 ENCOUNTER — Other Ambulatory Visit: Payer: Self-pay | Admitting: Obstetrics and Gynecology

## 2016-03-02 DIAGNOSIS — Z1231 Encounter for screening mammogram for malignant neoplasm of breast: Secondary | ICD-10-CM

## 2016-03-09 ENCOUNTER — Ambulatory Visit
Admission: RE | Admit: 2016-03-09 | Discharge: 2016-03-09 | Disposition: A | Discharge status: Home / Self Care | Payer: Worker's Compensation | Source: Ambulatory Visit | Attending: Family Medicine | Admitting: Family Medicine

## 2016-03-09 DIAGNOSIS — G43809 Other migraine, not intractable, without status migrainosus: Secondary | ICD-10-CM

## 2016-03-16 ENCOUNTER — Ambulatory Visit
Admission: RE | Admit: 2016-03-16 | Discharge: 2016-03-16 | Disposition: A | Discharge status: Home / Self Care | Payer: Medicaid Other | Source: Ambulatory Visit | Attending: Obstetrics and Gynecology | Admitting: Obstetrics and Gynecology

## 2016-03-16 DIAGNOSIS — Z1231 Encounter for screening mammogram for malignant neoplasm of breast: Secondary | ICD-10-CM

## 2016-03-27 ENCOUNTER — Encounter: Payer: Self-pay | Admitting: Neurology

## 2016-03-27 ENCOUNTER — Ambulatory Visit (INDEPENDENT_AMBULATORY_CARE_PROVIDER_SITE_OTHER): Payer: Medicaid Other | Admitting: Neurology

## 2016-03-27 VITALS — BP 143/79 | HR 80 | Ht 70.0 in | Wt 269.0 lb

## 2016-03-27 DIAGNOSIS — G43019 Migraine without aura, intractable, without status migrainosus: Secondary | ICD-10-CM
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
## 2016-03-27 NOTE — Patient Instructions (Addendum)
   With the gabapentin, start 300 mg twice a day for 1 week, then take one in the morning and 2 in the evening.   Migraine Headache A migraine headache is an intense, throbbing pain on one or both sides of your head. A migraine can last for 30 minutes to several hours. CAUSES  The exact cause of a migraine headache is not always known. However, a migraine may be caused when nerves in the brain become irritated and release chemicals that cause inflammation. This causes pain. Certain things may also trigger migraines, such as:  Alcohol.  Smoking.  Stress.  Menstruation.  Aged cheeses.  Foods or drinks that contain nitrates, glutamate, aspartame, or tyramine.  Lack of sleep.  Chocolate.  Caffeine.  Hunger.  Physical exertion.  Fatigue.  Medicines used to treat chest pain (nitroglycerine), birth control pills, estrogen, and some blood pressure medicines. SIGNS AND SYMPTOMS  Pain on one or both sides of your head.  Pulsating or throbbing pain.  Severe pain that prevents daily activities.  Pain that is aggravated by any physical activity.  Nausea, vomiting, or both.  Dizziness.  Pain with exposure to bright lights, loud noises, or activity.  General sensitivity to bright lights, loud noises, or smells. Before you get a migraine, you may get warning signs that a migraine is coming (aura). An aura may include:  Seeing flashing lights.  Seeing bright spots, halos, or zigzag lines.  Having tunnel vision or blurred vision.  Having feelings of numbness or tingling.  Having trouble talking.  Having muscle weakness. DIAGNOSIS  A migraine headache is often diagnosed based on:  Symptoms.  Physical exam.  A CT scan or MRI of your head. These imaging tests cannot diagnose migraines, but they can help rule out other causes of headaches. TREATMENT Medicines may be given for pain and nausea. Medicines can also be given to help prevent recurrent migraines.  HOME  CARE INSTRUCTIONS  Only take over-the-counter or prescription medicines for pain or discomfort as directed by your health care provider. The use of long-term narcotics is not recommended.  Lie down in a dark, quiet room when you have a migraine.  Keep a journal to find out what may trigger your migraine headaches. For example, write down:  What you eat and drink.  How much sleep you get.  Any change to your diet or medicines.  Limit alcohol consumption.  Quit smoking if you smoke.  Get 7-9 hours of sleep, or as recommended by your health care provider.  Limit stress.  Keep lights dim if bright lights bother you and make your migraines worse. SEEK IMMEDIATE MEDICAL CARE IF:   Your migraine becomes severe.  You have a fever.  You have a stiff neck.  You have vision loss.  You have muscular weakness or loss of muscle control.  You start losing your balance or have trouble walking.  You feel faint or pass out.  You have severe symptoms that are different from your first symptoms. MAKE SURE YOU:   Understand these instructions.  Will watch your condition.  Will get help right away if you are not doing well or get worse.   This information is not intended to replace advice given to you by your health care provider. Make sure you discuss any questions you have with your health care provider.   Document Released: 08/13/2005 Document Revised: 09/03/2014 Document Reviewed: 04/20/2013 Elsevier Interactive Patient Education [employer].

## 2016-05-24 ENCOUNTER — Telehealth: Payer: Self-pay | Admitting: Internal Medicine

## 2016-05-24 NOTE — Telephone Encounter (Signed)
Patient has not been seen since 2011. Please advise    KP

## 2016-05-24 NOTE — Telephone Encounter (Signed)
Patient used to be a patient of Dr. Sherrine Maples a while ago. She is seeing a doctor for headaches now and is wondering what she was prescribed topamax for back when she was seeing Dr. Sherrine Maples? Please advise   Patient Phone: [phone]

## 2016-05-24 NOTE — Telephone Encounter (Signed)
Discussed with patient and I made her aware of Dr.Lowne's response, she said she has never had migraines, she said it was given to her for weight loss,and she though it may have been as early as 2006. I made her aware Dr.Kulik would have been her provider at the time, I reviewed the problem list and she said the migraine diagnosis is new. We discussed the Epic system and we went on this system in 2012 so the records prior to that date would not be available.  I advise she can contact medical records to request her records, I gave her the number and and she thanked me for returning the call.   KP

## 2016-05-24 NOTE — Telephone Encounter (Signed)
Most likely it was for migraines but epic does not show ov notes that far back.

## 2016-06-07 ENCOUNTER — Ambulatory Visit (INDEPENDENT_AMBULATORY_CARE_PROVIDER_SITE_OTHER): Payer: Medicaid Other | Admitting: Neurology

## 2016-06-07 ENCOUNTER — Encounter: Payer: Self-pay | Admitting: Neurology

## 2016-06-07 VITALS — BP 145/80 | HR 86 | Ht 70.0 in | Wt 270.5 lb

## 2016-06-07 DIAGNOSIS — G43019 Migraine without aura, intractable, without status migrainosus: Secondary | ICD-10-CM | POA: Diagnosis not present

## 2016-06-07 MED ORDER — GABAPENTIN 300 MG PO CAPS: 300.0000 mg | ORAL_CAPSULE | Freq: Two times a day (BID) | ORAL | 3 refills | Status: DC

## 2016-06-07 NOTE — Patient Instructions (Signed)
   Neurontin (gabapentin) may result in drowsiness, ankle swelling, gait instability, or possibly dizziness. Please contact our office if significant side effects occur with this medication.

## 2016-06-07 NOTE — Progress Notes (Signed)
Reason for visit: Headache  Kimberly [name] is an 55 y.o. female  History of present illness:  Kimberly [name] is a 55 year old right-handed white female with a history of migraine type headaches. The patient has a sensation of pressure or tension in the temporal areas. Noises are a significant activator for her headaches. She is having about 3 headache days a week. She was placed on gabapentin, but she only took several weeks of the medication and stopped. It is not clear from her history whether this helped her headaches are not. The patient has since become unemployed, this alone has helped some of the headaches. The patient may have some nausea with the headache, she takes Motrin with some benefit. The patient returns to this office for an evaluation.  Past Medical History:  Diagnosis Date  . Anxiety    takes Ativan as needed  . Bronchitis    hx of;last time > 25yrago  . Cancer ([facility]    part of left lower lung removed- lung ca 2013  . Colitis   . Colon polyps    hx of  . Complication of anesthesia    gas and MAC procedures  . Depression    takes Wellbutrin daily  . Diabetes mellitus    Novolog and Levemir daily  . [facility] (di[facility], type 2) ([facility]   . Eczema   . GERD (gastroesophageal reflux disease)    takes Protonix nightly  . Headache(784.0)    stress HA frequently  . IBS (irritable bowel syndrome)   . Lung mass    left upper lobe  . Pneumonia    walking pneumonia in 2007  . PONV (postoperative nausea and vomiting)   . Shortness of breath    certain times of the year  . Stress incontinence     Past Surgical History:  Procedure Laterality Date  . BIOPSY THYROID     pt has thyroid polyps another scan in Mar 2013  . CAESAREAN SECTION  96/98/2000  . colonosocpy    . Rectal fistula  1993  . THORACOTOMY  10/26/2011   Procedure: THORACOTOMY MAJOR;  Surgeon: BGaye Pollack MD;  [facility];  Service: Thoracic;  Laterality: Left;  . UTERINE FIBROID SURGERY       late 30's early 433's   Family History  Problem Relation Age of Onset  . Hyperlipidemia Mother   . Diabetes Mother   . Cancer Mother     kidney   . Cancer Father     liver  . Breast cancer Maternal Aunt   . Heart disease Maternal Grandmother   . Cancer Maternal Grandfather     kidney   . Heart disease Maternal Grandfather   . Hyperlipidemia Maternal Grandfather   . Breast cancer Other   . Anesthesia problems Sister   . Migraines Sister   . Hypotension Neg Hx   . Malignant hyperthermia Neg Hx   . Pseudochol deficiency Neg Hx     Social history:  reports that she quit smoking about 33 years ago. Her smoking use included Cigarettes. She has never used smokeless tobacco. She reports that she does not drink alcohol or use drugs.    Allergies  Allergen Reactions  . Codeine Nausea And Vomiting    migraines  . Diflucan [Fluconazole] Other (See Comments)    Blisters  . Metoclopramide Hcl Other (See Comments)    Do not give at all;muscle jerking  . Penicillins     REACTION:  unspecified  . Prednisone Nausea And Vomiting    migraines    Medications:  Prior to Admission medications   Medication Sig Start Date End Date Taking? Authorizing Provider  ACCU-CHEK FASTCLIX LANCETS [place]  03/17/16  Yes Historical Provider, MD  ACCU-CHEK SMARTVIEW test strip  03/12/16  Yes Historical Provider, MD  albuterol (PROVENTIL) (2.5 MG/3ML) 0.083% nebulizer solution Take 2.5 mg by nebulization every 6 (six) hours as needed. For shortness of breath   Yes Historical Provider, MD  buPROPion (WELLBUTRIN XL) 150 MG 24 hr tablet Take 150 mg by mouth every evening. **BRAND NAME MEDICALLY NECESSARY**   Yes Historical Provider, MD  cholecalciferol (VITAMIN D) 1000 UNITS tablet Take 1,000 Units by mouth daily.    Yes Historical Provider, MD  cyclobenzaprine (FLEXERIL) 10 MG tablet take 1 tablet by mouth twice a day if needed 03/25/16  Yes Historical Provider, MD  gabapentin (NEURONTIN) 300 MG capsule Take 1  qhs for 5 nights, then increase to 2 qhs for 5 nights, then increase to 3 po at qhs. 02/29/16  Yes Historical Provider, MD  ibuprofen (ADVIL,MOTRIN) 200 MG tablet Take 200 mg by mouth every 6 (six) hours as needed for moderate pain.   Yes Historical Provider, MD  ibuprofen (ADVIL,MOTRIN) 400 MG tablet Take 400 mg by mouth every 6 (six) hours as needed.   Yes Historical Provider, MD  ibuprofen (ADVIL,MOTRIN) 800 MG tablet take 1 tablet by mouth every 6 hours if needed for UP TO 10 DAYS 01/20/16  Yes Historical Provider, MD  insulin aspart (NOVOLOG FLEXPEN) 100 UNIT/ML injection Inject 35 Units into the skin 3 (three) times daily before meals.    Yes Historical Provider, MD  insulin detemir (LEVEMIR FLEXPEN) 100 UNIT/ML injection Inject 48 Units into the skin 2 (two) times daily.    Yes Historical Provider, MD  Insulin Glargine (LANTUS SOLOSTAR) 100 UNIT/ML Solostar Pen Inject 50 units qAM and 60 units qPM with additional as needed.  Max daily dose: 150 units/day 01/27/16  Yes Historical Provider, MD  LORazepam (ATIVAN) 0.5 MG tablet take 1 tablet by mouth twice a day if needed for anxiety 06/29/12  Yes Campbell Riches, MD  meclizine (ANTIVERT) 25 MG tablet  02/07/16  Yes Historical Provider, MD  metFORMIN (GLUMETZA) 500 MG (MOD) 24 hr tablet Take 500-1,000 mg by mouth See admin instructions. Pt takes 10135m in the morning and 5071min the evening   Yes Historical Provider, MD  ondansetron (ZOFRAN ODT) 4 MG disintegrating tablet 35m47mDT q4 hours prn nausea/vomit 01/18/15  Yes DavDrenda FreezeD  pantoprazole (PROTONIX) 40 MG tablet Take 40 mg by mouth daily.   Yes Historical Provider, MD  PROAIR HFA 108 (90 BASE) MCG/ACT inhaler Inhale 2 puffs into the lungs every 6 (six) hours as needed. For shortness of breath 02/04/15  Yes BryGaye PollackD  rizatriptan (MAXALT-MLT) 10 MG disintegrating tablet Take 10 mg by mouth. 01/27/16  Yes Historical Provider, MD  sucralfate (CARAFATE) 1 G tablet Take 1 tablet (1 g  total) by mouth 4 (four) times daily. Patient taking differently: Take 1 g by mouth 4 (four) times daily as needed.  08/01/13  Yes MarTanna FurryD    ROS:  Out of a complete 14 system review of symptoms, the patient complains only of the following symptoms, and all other reviewed systems are negative.  Ear pain Light sensitivity Insomnia Dizziness, headache  Blood pressure (!) 145/80, pulse 86, height 5' 10"  (1.778 m), weight 270 lb 8  oz (122.7 kg).  Physical Exam  General: The patient is alert and cooperative at the time of the examination. The patient is moderately obese.  Skin: No significant peripheral edema is noted.   Neurologic Exam  Mental status: The patient is alert and oriented x 3 at the time of the examination. The patient has apparent normal recent and remote memory, with an apparently normal attention span and concentration ability.   Cranial nerves: Facial symmetry is present. Speech is normal, no aphasia or dysarthria is noted. Extraocular movements are full. Visual fields are full.  Motor: The patient has good strength in all 4 extremities.  Sensory examination: Soft touch sensation is symmetric on the face, arms, and legs.  Coordination: The patient has good finger-nose-finger and heel-to-shin bilaterally.  Gait and station: The patient has a normal gait. Tandem gait is normal. Romberg is negative. No drift is seen.  Reflexes: Deep tendon reflexes are symmetric.   Assessment/Plan:  1. History of migraine headache  The patient has come off of her medications, we will restart the gabapentin taking 300 mg twice daily, the patient will call in several weeks if she is tolerating the medication and the headaches have not improved. The patient will follow-up in 3 or 4 months. If the gabapentin is not effective, we may try low-dose Zonegran.   Jill Alexanders MD 06/07/2016 2:46 PM  [facility] [address] [place], [place]  [address]  Phone [phone] Fax [phone]

## 2016-07-27 ENCOUNTER — Ambulatory Visit: Payer: Self-pay | Admitting: Neurology

## 2016-09-11 ENCOUNTER — Ambulatory Visit: Payer: Medicaid Other | Admitting: Adult Health

## 2016-11-20 DIAGNOSIS — B977 Papillomavirus as the cause of diseases classified elsewhere: Secondary | ICD-10-CM

## 2016-11-20 HISTORY — DX: Papillomavirus as the cause of diseases classified elsewhere: B97.7

## 2017-01-10 ENCOUNTER — Other Ambulatory Visit: Payer: Self-pay | Admitting: *Deleted

## 2017-01-10 DIAGNOSIS — R911 Solitary pulmonary nodule: Secondary | ICD-10-CM

## 2017-02-20 ENCOUNTER — Encounter: Payer: Self-pay | Admitting: Surgery

## 2017-02-20 ENCOUNTER — Ambulatory Visit (INDEPENDENT_AMBULATORY_CARE_PROVIDER_SITE_OTHER): Payer: Medicaid Other | Admitting: Surgery

## 2017-02-20 ENCOUNTER — Ambulatory Visit
Admission: RE | Admit: 2017-02-20 | Discharge: 2017-02-20 | Disposition: A | Discharge status: Home / Self Care | Payer: Medicaid Other | Source: Ambulatory Visit | Attending: Surgery | Admitting: Surgery

## 2017-02-20 VITALS — BP 151/69 | HR 75 | Resp 16 | Ht 70.0 in | Wt 265.0 lb

## 2017-02-20 DIAGNOSIS — Z09 Encounter for follow-up examination after completed treatment for conditions other than malignant neoplasm: Secondary | ICD-10-CM | POA: Diagnosis not present

## 2017-02-20 DIAGNOSIS — C3432 Malignant neoplasm of lower lobe, left bronchus or lung: Secondary | ICD-10-CM

## 2017-02-20 DIAGNOSIS — D3A090 Benign carcinoid tumor of the bronchus and lung: Secondary | ICD-10-CM | POA: Diagnosis not present

## 2017-02-20 DIAGNOSIS — D381 Neoplasm of uncertain behavior of trachea, bronchus and lung: Secondary | ICD-10-CM | POA: Diagnosis not present

## 2017-02-20 DIAGNOSIS — R911 Solitary pulmonary nodule: Secondary | ICD-10-CM

## 2017-02-20 NOTE — Progress Notes (Signed)
HPI:  The patient returns today for routine surveillance followup status post left thoracotomy and wedge resection of a left lower lobe carcinoid tumor on 10/26/2011. This was a well-differentiated carcinoid with negative resection margins. She also had a 4 mm RUL nodule that had been seen on a previous CT but was not seen on the last CT of the chest dated 02/15/2016. She has been feeling well with no cough or sputum production, no hemoptysis, no dyspnea or chest pain.  Current Outpatient Prescriptions  Medication Sig Dispense Refill  . ACCU-CHEK FASTCLIX LANCETS MISC   0  . ACCU-CHEK SMARTVIEW test strip   1  . albuterol (PROVENTIL) (2.5 MG/3ML) 0.083% nebulizer solution Take 2.5 mg by nebulization every 6 (six) hours as needed. For shortness of breath    . buPROPion (WELLBUTRIN XL) 150 MG 24 hr tablet Take 150 mg by mouth every evening. **BRAND NAME MEDICALLY NECESSARY**    . cholecalciferol (VITAMIN D) 1000 UNITS tablet Take 1,000 Units by mouth daily.     Marland Kitchen ibuprofen (ADVIL,MOTRIN) 200 MG tablet Take 200 mg by mouth every 6 (six) hours as needed for moderate pain.    Marland Kitchen insulin aspart (NOVOLOG FLEXPEN) 100 UNIT/ML injection Inject 35 Units into the skin 3 (three) times daily before meals.     . Insulin Glargine (LANTUS SOLOSTAR) 100 UNIT/ML Solostar Pen Inject 50 units qAM and 60 units qPM with additional as needed.  Max daily dose: 150 units/day    . LORazepam (ATIVAN) 0.5 MG tablet take 1 tablet by mouth twice a day if needed for anxiety 30 tablet 2  . metFORMIN (GLUMETZA) 500 MG (MOD) 24 hr tablet Take 500-1,000 mg by mouth See admin instructions. Pt takes 1036m in the morning and 5013min the evening    . ondansetron (ZOFRAN ODT) 4 MG disintegrating tablet 72m92mDT q4 hours prn nausea/vomit 8 tablet 0  . pantoprazole (PROTONIX) 40 MG tablet Take 40 mg by mouth daily.    . sucralfate (CARAFATE) 1 G tablet Take 1 tablet (1 g total) by mouth 4 (four) times daily. (Patient taking  differently: Take 1 g by mouth 4 (four) times daily as needed. ) 60 tablet 0  . PROAIR HFA 108 (90 BASE) MCG/ACT inhaler Inhale 2 puffs into the lungs every 6 (six) hours as needed. For shortness of breath 1 Inhaler 6   No current facility-administered medications for this visit.      Physical Exam: BP (!) 151/69 (BP Location: Left Arm, Patient Position: Sitting, Cuff Size: Large)   Pulse 75   Resp 16   Ht 5' 10"  (1.778 m)   Wt 265 lb (120.2 kg)   SpO2 98% Comment: ON RA  BMI 38.02 kg/m  She looks well There is no cervical or supraclavicular adenopathy.  Lungs are clear.  The left chest incision is well-healed. There are no skin lesions   Diagnostic Tests:  CLINICAL DATA:  Follow-up lung nodule.  History of lung cancer.  EXAM: CT CHEST WITHOUT CONTRAST  TECHNIQUE: Multidetector CT imaging of the chest was performed following the standard protocol without IV contrast.  COMPARISON:  02/15/2016  FINDINGS: Cardiovascular: Normal caliber of the thoracic aorta. Normal heart size. No pericardial effusion.  Mediastinum/Nodes: Unchanged subcentimeter low-density nodule on the right thyroid lobe. No enlarged axillary, mediastinal, or hilar lymph nodes.  Lungs/Pleura: No pleural effusion. 3 mm subpleural nodule in the right upper lobe is unchanged from 12/03/2013 and considered benign. No new or suspicious nodules are  identified. Postsurgical scarring in the left lower lobe is unchanged.  Upper Abdomen: Hepatic steatosis. Partially visualized chronic splenomegaly.  Musculoskeletal: No acute osseous abnormality or suspicious osseous lesion.  IMPRESSION: Unchanged appearance of the lungs including left lower lobe postsurgical scarring. No new or suspicious nodules.   Electronically Signed   By: Logan Bores M.D.   On: 02/20/2017 14:38   Impression:  I have personally reviewed and interpreted the chest CT images and compared them to the previous  studies. The small RUL nodule is unchanged from the CT in 11/2013 and is most likely benign. There is no other evidence of recurrent tumor. She does have a  history of 2 ppd smoking for several years until she quit in 1984. Given her age and prior malignant lung tumor I think she would benefit from continuing yearly low dose screening chest CT scans. I discussed this with her and she is in agreement. I reviewed the current CT scan with her and answered her questions.  Plan:  Follow up in one year with low dose screening chest CT.   I spent 15 minutes performing this established patient evaluation and > 50% of this time was spent face to face counseling and coordinating the surveillance of this patient's prior carcinoid tumor of the lung and RUL lung nodule.    Gaye Pollack, MD [facility] [phone]

## 2017-03-14 ENCOUNTER — Other Ambulatory Visit: Payer: Self-pay | Admitting: Obstetrics and Gynecology

## 2017-03-14 DIAGNOSIS — Z1231 Encounter for screening mammogram for malignant neoplasm of breast: Secondary | ICD-10-CM

## 2017-03-18 ENCOUNTER — Inpatient Hospital Stay: Admission: RE | Admit: 2017-03-18 | Payer: Medicaid Other | Source: Ambulatory Visit

## 2017-07-11 ENCOUNTER — Telehealth: Payer: Self-pay | Admitting: Family Medicine

## 2017-07-11 NOTE — Telephone Encounter (Signed)
Per Dr. Etter Sjogren ok to come back

## 2017-07-11 NOTE — Telephone Encounter (Signed)
Patient called and stts that she was a patient previous with Dr. Etter Sjogren, patient left due to [place] not accepting [place] medicaid, patient now has [employer] and wants to come back to being Dr. Etter Sjogren patient will she accept this patient?  Thanks [employer]

## 2017-07-12 NOTE — Telephone Encounter (Signed)
Call Patient and left a voicemail, that Dr. Etter Sjogren approve her coming back to the office and being a patient again.

## 2017-09-05 ENCOUNTER — Ambulatory Visit: Payer: Self-pay | Admitting: Family Medicine

## 2017-10-10 ENCOUNTER — Encounter: Payer: Self-pay | Admitting: Family Medicine

## 2017-10-10 ENCOUNTER — Ambulatory Visit: Payer: BLUE CROSS/BLUE SHIELD | Admitting: Family Medicine

## 2017-10-10 VITALS — BP 163/80 | HR 80 | Temp 98.2°F | Resp 16 | Ht 70.0 in | Wt 272.0 lb

## 2017-10-10 DIAGNOSIS — J0140 Acute pansinusitis, unspecified: Secondary | ICD-10-CM | POA: Diagnosis not present

## 2017-10-10 DIAGNOSIS — E1165 Type 2 diabetes mellitus with hyperglycemia: Secondary | ICD-10-CM | POA: Diagnosis not present

## 2017-10-10 MED ORDER — AZITHROMYCIN 250 MG PO TABS: ORAL_TABLET | ORAL | 0 refills | Status: DC

## 2017-10-10 MED ORDER — DOXYCYCLINE HYCLATE 100 MG PO TABS: 100.0000 mg | ORAL_TABLET | Freq: Two times a day (BID) | ORAL | 0 refills | Status: DC

## 2017-10-10 NOTE — Patient Instructions (Addendum)

## 2017-10-10 NOTE — Assessment & Plan Note (Signed)
abx per orders otc flonase or nasacort  otc antihistamine rto prn

## 2017-10-10 NOTE — Progress Notes (Signed)
Patient ID: Kimberly [name], female    DOB: [birth year]  Age: 57 y.o. MRN: [id number]    Subjective:  Subjective  HPI Kimberly [name] presents to re establish and f/u dm.  She will need an endo referral  She also c/o sinus infection -- she is taking cold and sinus , rob dm , chloreseptic . Pt also c/o frequent utis and she is dry in vaginal area.   s Review of Systems  Constitutional: Negative for appetite change, diaphoresis, fatigue and unexpected weight change.  HENT: Positive for congestion, sinus pressure and sinus pain.   Eyes: Negative for pain, redness and visual disturbance.  Respiratory: Negative for cough, chest tightness, shortness of breath and wheezing.   Cardiovascular: Negative for chest pain, palpitations and leg swelling.  Endocrine: Negative for cold intolerance, heat intolerance, polydipsia, polyphagia and polyuria.  Genitourinary: Negative for difficulty urinating, dysuria and frequency.  Neurological: Negative for dizziness, light-headedness, numbness and headaches.    History Past Medical History:  Diagnosis Date  . Anxiety    takes Ativan as needed  . Bronchitis    hx of;last time > 61yrago  . Cancer ([facility]    part of left lower lung removed- lung ca 2013  . Colitis   . Colon polyps    hx of  . Complication of anesthesia    gas and MAC procedures  . Depression    takes Wellbutrin daily  . Diabetes mellitus    Novolog and Levemir daily  . DM2 (diabetes mellitus, type 2) ([place]   . Eczema   . GERD (gastroesophageal reflux disease)    takes Protonix nightly  . Headache(784.0)    stress HA frequently  . IBS (irritable bowel syndrome)   . Lung mass    left upper lobe  . Pneumonia    walking pneumonia in 2007  . PONV (postoperative nausea and vomiting)   . Shortness of breath    certain times of the year  . Stress incontinence     She has a past surgical history that includes CAESAREAN SECTION (96/98/2000); Rectal fistula (1993); Uterine fibroid  surgery; colonosocpy; Biopsy thyroid; and Thoracotomy (10/26/2011).   Her family history includes Anesthesia problems in her sister; Breast cancer in her maternal aunt and other; Cancer in her father, maternal grandfather, and mother; Diabetes in her mother; Heart disease in her maternal grandfather and maternal grandmother; Hyperlipidemia in her maternal grandfather and mother; Migraines in her sister.She reports that she quit smoking about 35 years ago. Her smoking use included cigarettes. she has never used smokeless tobacco. She reports that she does not drink alcohol or use drugs.  Current Outpatient Medications on File Prior to Visit  Medication Sig Dispense Refill  . ACCU-CHEK FASTCLIX LANCETS MISC   0  . ACCU-CHEK SMARTVIEW test strip   1  . albuterol (PROVENTIL) (2.5 MG/3ML) 0.083% nebulizer solution Take 2.5 mg by nebulization every 6 (six) hours as needed. For shortness of breath    . buPROPion (WELLBUTRIN XL) 150 MG 24 hr tablet Take 150 mg by mouth every evening.     .Marland Kitchenibuprofen (ADVIL,MOTRIN) 200 MG tablet Take 200 mg by mouth every 6 (six) hours as needed for moderate pain.    .Marland Kitcheninsulin aspart (NOVOLOG FLEXPEN) 100 UNIT/ML injection Inject 35 Units into the skin 3 (three) times daily before meals.     . Insulin Glargine (LANTUS SOLOSTAR) 100 UNIT/ML Solostar Pen Inject 50 units qAM and 60 units qPM with additional as needed.  Max daily dose: 150 units/day    . LORazepam (ATIVAN) 0.5 MG tablet take 1 tablet by mouth twice a day if needed for anxiety 30 tablet 2  . metFORMIN (GLUMETZA) 500 MG (MOD) 24 hr tablet Take 500-1,000 mg by mouth See admin instructions. Pt takes 1066m in the morning and 5010min the evening    . ondansetron (ZOFRAN ODT) 4 MG disintegrating tablet 5m11mDT q4 hours prn nausea/vomit 8 tablet 0  . pantoprazole (PROTONIX) 40 MG tablet Take 40 mg by mouth 2 (two) times daily.     . PMarland KitchenOAIR HFA 108 (90 BASE) MCG/ACT inhaler Inhale 2 puffs into the lungs every 6 (six)  hours as needed. For shortness of breath 1 Inhaler 6  . sucralfate (CARAFATE) 1 G tablet Take 1 tablet (1 g total) by mouth 4 (four) times daily. (Patient taking differently: Take 1 g by mouth 4 (four) times daily as needed. ) 60 tablet 0   No current facility-administered medications on file prior to visit.      Objective:  Objective  Physical Exam  Constitutional: She is oriented to person, place, and time. She appears well-developed and well-nourished.  HENT:  Head: Normocephalic and atraumatic.  Nose: Right sinus exhibits maxillary sinus tenderness and frontal sinus tenderness. Left sinus exhibits maxillary sinus tenderness and frontal sinus tenderness.  Mouth/Throat: No oropharyngeal exudate or posterior oropharyngeal erythema.  Eyes: Conjunctivae and EOM are normal.  Neck: Normal range of motion. Neck supple. No JVD present. Carotid bruit is not present. No thyromegaly present.  Cardiovascular: Normal rate, regular rhythm and normal heart sounds.  No murmur heard. Pulmonary/Chest: Effort normal and breath sounds normal. No respiratory distress. She has no wheezes. She has no rales. She exhibits no tenderness.  Musculoskeletal: She exhibits no edema.  Neurological: She is alert and oriented to person, place, and time.  Psychiatric: She has a normal mood and affect.  Nursing note and vitals reviewed.  BP (!) 163/80 (BP Location: Right Arm, Cuff Size: Large)   Pulse 80   Temp 98.2 F (36.8 C) (Oral)   Resp 16   Ht _0  (1.778 m)   Wt 272 lb (123.4 kg)   SpO2 98%   BMI 39.03 kg/m  Wt Readings from Last 3 Encounters:  10/10/17 272 lb (123.4 kg)  02/20/17 265 lb (120.2 kg)  06/07/16 270 lb 8 oz (122.7 kg)     Lab Results  Component Value Date   WBC 4.0 01/18/2015   HGB 12.8 01/18/2015   HCT 38.4 01/18/2015   PLT 137 (L) 01/18/2015   GLUCOSE 230 (H) 01/18/2015   ALT 30 01/18/2015   AST 28 01/18/2015   NA 137 01/18/2015   K 4.0 01/18/2015   CL 100 (L) 01/18/2015    CREATININE 0.70 01/18/2015   BUN 7 01/18/2015   CO2 25 01/18/2015   TSH 0.56 03/05/2008   INR 1.04 10/24/2011   HGBA1C 7.4 (H) 09/13/2006    Ct Chest Wo Contrast  Result Date: 02/20/2017 CLINICAL DATA:  Follow-up lung nodule.  History of lung cancer. EXAM: CT CHEST WITHOUT CONTRAST TECHNIQUE: Multidetector CT imaging of the chest was performed following the standard protocol without IV contrast. COMPARISON:  02/15/2016 FINDINGS: Cardiovascular: Normal caliber of the thoracic aorta. Normal heart size. No pericardial effusion. Mediastinum/Nodes: Unchanged subcentimeter low-density nodule on the right thyroid lobe. No enlarged axillary, mediastinal, or hilar lymph nodes. Lungs/Pleura: No pleural effusion. 3 mm subpleural nodule in the right upper lobe is unchanged from 12/03/2013  and considered benign. No new or suspicious nodules are identified. Postsurgical scarring in the left lower lobe is unchanged. Upper Abdomen: Hepatic steatosis. Partially visualized chronic splenomegaly. Musculoskeletal: No acute osseous abnormality or suspicious osseous lesion. IMPRESSION: Unchanged appearance of the lungs including left lower lobe postsurgical scarring. No new or suspicious nodules. Electronically Signed   By: Logan Bores M.D.   On: 02/20/2017 14:38     Assessment & Plan:  Plan  I have discontinued Kimberly [name]'s cholecalciferol and doxycycline. I am also having her start on azithromycin. Additionally, I am having her maintain her insulin aspart, buPROPion, albuterol, pantoprazole, LORazepam, sucralfate, metFORMIN, ibuprofen, ondansetron, PROAIR HFA, Insulin Glargine, ACCU-CHEK SMARTVIEW, and ACCU-CHEK FASTCLIX LANCETS.  Meds ordered this encounter  Medications  . DISCONTD: doxycycline (VIBRA-TABS) 100 MG tablet    Sig: Take 1 tablet (100 mg total) by mouth 2 (two) times daily.    Dispense:  20 tablet    Refill:  0  . azithromycin (ZITHROMAX Z-PAK) 250 MG tablet    Sig: As directed    Dispense:   6 each    Refill:  0    D/c doxycycline    Problem List Items Addressed This Visit      Unprioritized   ACUTE SINUSITIS, UNSPECIFIED    abx per orders otc flonase or nasacort  otc antihistamine rto prn       Relevant Medications   azithromycin (ZITHROMAX Z-PAK) 250 MG tablet    Other Visit Diagnoses    Uncontrolled type 2 diabetes mellitus with hyperglycemia ([place])    -  Primary   Relevant Orders   Ambulatory referral to Endocrinology      Follow-up: Return in about 6 months (around 04/09/2018), or if symptoms worsen or fail to improve, for annual exam, fasting.  [name], DO

## 2017-10-14 ENCOUNTER — Other Ambulatory Visit: Payer: Self-pay | Admitting: Family Medicine

## 2017-10-14 ENCOUNTER — Ambulatory Visit: Payer: Self-pay

## 2017-10-14 DIAGNOSIS — F430 Acute stress reaction: Secondary | ICD-10-CM

## 2017-10-14 MED ORDER — DIAZEPAM 5 MG PO TABS: 5.0000 mg | ORAL_TABLET | Freq: Two times a day (BID) | ORAL | 1 refills | Status: DC | PRN

## 2017-10-14 NOTE — Telephone Encounter (Signed)
  Answer Assessment - Initial Assessment Questions 1. CONCERN: "What happened that made you call today?"     Her son committed suicide and she found him yesterday.  Is requesting something for her nerves.  2. ANXIETY SYMPTOM SCREENING: "Can you describe how you have been feeling?"  (e.g., tense, restless, panicky, anxious, keyed up, trouble sleeping, trouble concentrating)     *No Answer* 3. ONSET: "How long have you been feeling this way?"     *No Answer* 4. RECURRENT: "Have you felt this way before?"  If yes: "What happened that time?" "What helped these feelings go away in the past?"      *No Answer* 5. RISK OF HARM - SUICIDAL IDEATION:  "Do you ever have thoughts of hurting or killing yourself?"  (e.g., yes, no, no but preoccupation with thoughts about death)   - INTENT:  "Do you have thoughts of hurting or killing yourself right NOW?" (e.g., yes, no, N/A)   - PLAN: "Do you have a specific plan for how you would do this?" (e.g., gun, knife, overdose, no plan, N/A)     *No Answer* 6. RISK OF HARM - HOMICIDAL IDEATION:  "Do you ever have thoughts of hurting or killing someone else?"  (e.g., yes, no, no but preoccupation with thoughts about death)   - INTENT:  "Do you have thoughts of hurting or killing someone right NOW?" (e.g., yes, no, N/A)   - PLAN: "Do you have a specific plan for how you would do this?" (e.g., gun, knife, no plan, N/A)      *No Answer* 7. FUNCTIONAL IMPAIRMENT: "How have things been going for you overall in your life? Have you had any more difficulties than usual doing your normal daily activities?"  (e.g., better, same, worse; self-care, school, work, interactions)     *No Answer* 8. SUPPORT: "Who is with you now?" "Who do you live with?" "Do you have family or friends nearby who you can talk to?"      *No Answer* 9. THERAPIST: "Do you have a counselor or therapist? Name?"     *No Answer* 10. STRESSORS: "Has there been any new stress or recent changes in your life?"      *No Answer* 11. CAFFEINE ABUSE: "Do you drink caffeinated beverages, and how much each day?" (e.g., coffee, tea, colas)       *No Answer* 12. SUBSTANCE ABUSE: "Do you use any illegal drugs or alcohol?"       *No Answer* 13. OTHER SYMPTOMS: "Do you have any other physical symptoms right now?" (e.g., chest pain, palpitations, difficulty breathing, fever)       *No Answer* 14. PREGNANCY: "Is there any chance you are pregnant?" "When was your last menstrual period?"       *No Answer*  Protocols used: ANXIETY AND PANIC ATTACK-A-AH

## 2017-10-14 NOTE — Telephone Encounter (Signed)
Rec'd call from pt. requesting a prescription for her nerves. Reported she just found her son yesterday, after he had hanged himself.  Stated she needed something to help her get through the next several days.  Questioned pt. if she has a good support system.  Reported her 74 yr. old son is with her, and they are handling all the arrangements.  Reported her other family is in [employer].  Reported the son that committed suicide has had a long hx of depression.  Questioned pt. what she has taken in the past for anxiety?  Reported she is currently taking Ativan and Wellbutrin.  Stated her job is very stressful, and she needs the Ativan to take for her job, and doesn't want to deplete the prescription of Ativan.  Is requesting a small quantity of Valium "to get through this."   Advised will report this to Dr. Carollee Herter, and a nurse will call her back.  Verb. Understanding.

## 2017-10-14 NOTE — Telephone Encounter (Signed)
Valium sent in for pt---   Message left on pt phone

## 2017-10-22 ENCOUNTER — Ambulatory Visit: Payer: Self-pay | Admitting: *Deleted

## 2017-10-22 ENCOUNTER — Ambulatory Visit: Payer: BLUE CROSS/BLUE SHIELD | Admitting: Family Medicine

## 2017-10-22 ENCOUNTER — Encounter: Payer: Self-pay | Admitting: Family Medicine

## 2017-10-22 VITALS — BP 130/68 | HR 76 | Temp 98.0°F | Resp 16

## 2017-10-22 DIAGNOSIS — Z634 Disappearance and death of family member: Secondary | ICD-10-CM

## 2017-10-22 DIAGNOSIS — R079 Chest pain, unspecified: Secondary | ICD-10-CM | POA: Insufficient documentation

## 2017-10-22 DIAGNOSIS — M25531 Pain in right wrist: Secondary | ICD-10-CM | POA: Diagnosis not present

## 2017-10-22 DIAGNOSIS — F4321 Adjustment disorder with depressed mood: Secondary | ICD-10-CM | POA: Diagnosis not present

## 2017-10-22 HISTORY — DX: Adjustment disorder with depressed mood: F43.21

## 2017-10-22 HISTORY — DX: Chest pain, unspecified: R07.9

## 2017-10-22 HISTORY — DX: Pain in right wrist: M25.531

## 2017-10-22 NOTE — Assessment & Plan Note (Signed)
Wrist splint -- ? cts  If no relief ---will refer to hand specialist

## 2017-10-22 NOTE — Patient Instructions (Signed)

## 2017-10-22 NOTE — Assessment & Plan Note (Signed)
con't wellbutrin  Lorazepam prn  List of counselors/ psych given to pt

## 2017-10-22 NOTE — Telephone Encounter (Signed)
Pt's half of her right hand is numb. She states half of her right hand and last 2 fingers.  She denies pain, difficulty walking, no blurred vision, no headache. She states her son died recently and she wonders if it could be stress related.  Flow notified. Appointment made for today at 1:45 per flow today.  Reason for Disposition . [1] Numbness (i.e., loss of sensation) of the face, arm / hand, or leg / foot on one side of the body AND [2] gradual onset (e.g., days to weeks) AND [3] present now  Answer Assessment - Initial Assessment Questions 1. SYMPTOM: "What is the main symptom you are concerned about?" (e.g., weakness, numbness)     Numbness right hand 2. ONSET: "When did this start?" (minutes, hours, days; while sleeping)     This morning 3. LAST NORMAL: "When was the last time you were normal (no symptoms)?"     yesterday 4. PATTERN "Does this come and go, or has it been constant since it started?"  "Is it present now?"     constant 5. CARDIAC SYMPTOMS: "Have you had any of the following symptoms: chest pain, difficulty breathing, palpitations?"     no 6. NEUROLOGIC SYMPTOMS: "Have you had any of the following symptoms: headache, dizziness, vision loss, double vision, changes in speech, unsteady on your feet?"     no 7. OTHER SYMPTOMS: "Do you have any other symptoms?"     no 8. PREGNANCY: "Is there any chance you are pregnant?" "When was your last menstrual period?"     No periods  Protocols used: NEUROLOGIC DEFICIT-A-AH

## 2017-10-22 NOTE — Progress Notes (Signed)
Patient ID: Kimberly [name], female   DOB: 05-May-1961, 57 y.o.   MRN: [id number]    Subjective:  I acted as a Education administrator for Dr. Carollee Herter.  Guerry Bruin, [place]   Patient ID: Kimberly [name], female    DOB: Sep 19, 1960, 56 y.o.   MRN: [id number]  Chief Complaint  Patient presents with  . numbness in right hand    HPI  Patient is in today for right hand numbness.  Numbness started this morning between 5 and 530.  She is under a lot of stress.  Her son committed suicide so she is having panic attacks and crying spells.  She is on the wellbutrin and can not tolerate any thing else.    Patient Care Team: Carollee Herter, Alferd Apa, DO as PCP - General (Family Medicine) Verl Blalock, Marijo Conception, MD (Inactive) (Cardiology) Ronald Lobo, MD (Gastroenterology) Maggie Schwalbe., MD as Consulting Physician (Family Medicine)   Past Medical History:  Diagnosis Date  . Anxiety    takes Ativan as needed  . Bronchitis    hx of;last time > 6yrago  . Cancer ([facility]    part of left lower lung removed- lung ca 2013  . Colitis   . Colon polyps    hx of  . Complication of anesthesia    gas and MAC procedures  . Depression    takes Wellbutrin daily  . Diabetes mellitus    Novolog and Levemir daily  . DM[place] (di[place], type 2) [place]   . Eczema   . GERD (gastroesophageal reflux disease)    takes Protonix nightly  . Headache(784.0)    stress HA frequently  . IBS (irritable bowel syndrome)   . Lung mass    left upper lobe  . Pneumonia    walking pneumonia in 2007  . PONV (postoperative nausea and vomiting)   . Shortness of breath    certain times of the year  . Stress incontinence     Past Surgical History:  Procedure Laterality Date  . BIOPSY THYROID     pt has thyroid polyps another scan in Mar 2013  . CAESAREAN SECTION  96/98/2000  . colonosocpy    . Rectal fistula  1993  . THORACOTOMY  10/26/2011   Procedure: THORACOTOMY MAJOR;  Surgeon: BGaye Pollack MD;  [facility];  Service:  Thoracic;  Laterality: Left;  . UTERINE FIBROID SURGERY     late 30's early 474's   Family History  Problem Relation Age of Onset  . Hyperlipidemia Mother   . Diabetes Mother   . Cancer Mother        kidney   . Cancer Father        liver  . Breast cancer Maternal Aunt   . Heart disease Maternal Grandmother   . Cancer Maternal Grandfather        kidney   . Heart disease Maternal Grandfather   . Hyperlipidemia Maternal Grandfather   . Breast cancer Other   . Anesthesia problems Sister   . Migraines Sister   . Hypotension Neg Hx   . Malignant hyperthermia Neg Hx   . Pseudochol deficiency Neg Hx     Social History   Socioeconomic History  . Marital status: Single    Spouse name: Not on file  . Number of children: 4  . Years of education: AAS  . Highest education level: Not on file  Social Needs  . Financial resource strain: Not on file  . Food  insecurity - worry: Not on file  . Food insecurity - inability: Not on file  . Transportation needs - medical: Not on file  . Transportation needs - non-medical: Not on file  Occupational History  . Occupation: N/A  Tobacco Use  . Smoking status: Former Smoker    Types: Cigarettes    Last attempt to quit: 08/27/1982    Years since quitting: 35.1  . Smokeless tobacco: Never Used  Substance and Sexual Activity  . Alcohol use: No  . Drug use: No  . Sexual activity: Not Currently    Birth control/protection: Post-menopausal  Other Topics Concern  . Not on file  Social History Narrative   Lives at home w/ her sons   Right-handed   Caffeine: cup of coffee each morning      Does not regularly exercise.     Outpatient Medications Prior to Visit  Medication Sig Dispense Refill  . ACCU-CHEK FASTCLIX LANCETS MISC   0  . ACCU-CHEK SMARTVIEW test strip   1  . albuterol (PROVENTIL) (2.5 MG/3ML) 0.083% nebulizer solution Take 2.5 mg by nebulization every 6 (six) hours as needed. For shortness of breath    . buPROPion (WELLBUTRIN  XL) 150 MG 24 hr tablet Take 150 mg by mouth every evening.     . diazepam (VALIUM) 5 MG tablet Take 1 tablet (5 mg total) by mouth every 12 (twelve) hours as needed for anxiety. 30 tablet 1  . ibuprofen (ADVIL,MOTRIN) 200 MG tablet Take 200 mg by mouth every 6 (six) hours as needed for moderate pain.    Marland Kitchen insulin aspart (NOVOLOG FLEXPEN) 100 UNIT/ML injection Inject 35 Units into the skin 3 (three) times daily before meals.     . Insulin Glargine (LANTUS SOLOSTAR) 100 UNIT/ML Solostar Pen Inject 50 units qAM and 60 units qPM with additional as needed.  Max daily dose: 150 units/day    . metFORMIN (GLUMETZA) 500 MG (MOD) 24 hr tablet Take 500-1,000 mg by mouth See admin instructions. Pt takes 1068m in the morning and 5040min the evening    . ondansetron (ZOFRAN ODT) 4 MG disintegrating tablet 73m43mDT q4 hours prn nausea/vomit 8 tablet 0  . pantoprazole (PROTONIX) 40 MG tablet Take 40 mg by mouth 2 (two) times daily.     . PMarland KitchenOAIR HFA 108 (90 BASE) MCG/ACT inhaler Inhale 2 puffs into the lungs every 6 (six) hours as needed. For shortness of breath 1 Inhaler 6  . sucralfate (CARAFATE) 1 G tablet Take 1 tablet (1 g total) by mouth 4 (four) times daily. (Patient taking differently: Take 1 g by mouth 4 (four) times daily as needed. ) 60 tablet 0  . LORazepam (ATIVAN) 0.5 MG tablet take 1 tablet by mouth twice a day if needed for anxiety 30 tablet 2  . azithromycin (ZITHROMAX Z-PAK) 250 MG tablet As directed 6 each 0   No facility-administered medications prior to visit.     Allergies  Allergen Reactions  . Codeine Nausea And Vomiting    migraines  . Diflucan [Fluconazole] Other (See Comments)    Blisters  . Metoclopramide Hcl Other (See Comments)    Do not give at all;muscle jerking  . Penicillins     REACTION: unspecified  . Prednisone Nausea And Vomiting    migraines    Review of Systems  Constitutional: Negative for fever and malaise/fatigue.  HENT: Negative for congestion.   Eyes:  Negative for blurred vision.  Respiratory: Negative for cough and shortness of breath.  Cardiovascular: Negative for chest pain, palpitations and leg swelling.  Gastrointestinal: Negative for vomiting.  Musculoskeletal: Negative for back pain.  Skin: Negative for rash.  Neurological: Negative for loss of consciousness and headaches.  Psychiatric/Behavioral: Positive for depression. Negative for hallucinations, memory loss, substance abuse and suicidal ideas. The patient is nervous/anxious. The patient does not have insomnia.        Objective:    Physical Exam  Constitutional: She is oriented to person, place, and time. She appears well-developed and well-nourished.  HENT:  Head: Normocephalic and atraumatic.  Eyes: Conjunctivae and EOM are normal.  Neck: Normal range of motion. Neck supple. No JVD present. Carotid bruit is not present. No thyromegaly present.  Cardiovascular: Normal rate, regular rhythm and normal heart sounds.  No murmur heard. Pulmonary/Chest: Effort normal and breath sounds normal. No respiratory distress. She has no wheezes. She has no rales. She exhibits no tenderness.  Musculoskeletal: She exhibits no edema.  Neurological: She is alert and oriented to person, place, and time.  Numbness and tingling in R hand---- all fingers except 5th finger  Psychiatric: Her behavior is normal. Judgment normal. Her mood appears anxious. Thought content is paranoid. Thought content is not delusional. Cognition and memory are normal. She exhibits a depressed mood. She expresses no homicidal and no suicidal ideation. She expresses no suicidal plans.  Nursing note and vitals reviewed.   BP 130/68   Pulse 76   Temp 98 F (36.7 C) (Oral)   Resp 16   SpO2 98%  Wt Readings from Last 3 Encounters:  10/10/17 272 lb (123.4 kg)  02/20/17 265 lb (120.2 kg)  06/07/16 270 lb 8 oz (122.7 kg)   BP Readings from Last 3 Encounters:  10/22/17 130/68  10/10/17 (!) 163/80  02/20/17 (!)  151/69     Immunization History  Administered Date(s) Administered  . Td 09/22/2001    Health Maintenance  Topic Date Due  . Hepatitis C Screening  November 26, 1960  . PNEUMOCOCCAL POLYSACCHARIDE VACCINE (1) 02/06/1962  . FOOT EXAM  02/06/1970  . OPHTHALMOLOGY EXAM  02/06/1970  . URINE MICROALBUMIN  02/06/1970  . HIV Screening  02/07/1975  . COLONOSCOPY  02/06/2010  . HEMOGLOBIN A1C  02/07/2011  . TETANUS/TDAP  09/23/2011  . PAP SMEAR  04/03/2015  . INFLUENZA VACCINE  06/12/2024 (Originally 03/27/2017)  . MAMMOGRAM  03/16/2018    Lab Results  Component Value Date   WBC 4.0 01/18/2015   HGB 12.8 01/18/2015   HCT 38.4 01/18/2015   PLT 137 (L) 01/18/2015   GLUCOSE 230 (H) 01/18/2015   ALT 30 01/18/2015   AST 28 01/18/2015   NA 137 01/18/2015   K 4.0 01/18/2015   CL 100 (L) 01/18/2015   CREATININE 0.70 01/18/2015   BUN 7 01/18/2015   CO2 25 01/18/2015   TSH 0.56 03/05/2008   INR 1.04 10/24/2011   HGBA1C 7.4 (H) 09/13/2006    Lab Results  Component Value Date   TSH 0.56 03/05/2008   Lab Results  Component Value Date   WBC 4.0 01/18/2015   HGB 12.8 01/18/2015   HCT 38.4 01/18/2015   MCV 83.8 01/18/2015   PLT 137 (L) 01/18/2015   Lab Results  Component Value Date   NA 137 01/18/2015   K 4.0 01/18/2015   CO2 25 01/18/2015   GLUCOSE 230 (H) 01/18/2015   BUN 7 01/18/2015   CREATININE 0.70 01/18/2015   BILITOT 0.8 01/18/2015   ALKPHOS 106 01/18/2015   AST 28 01/18/2015  ALT 30 01/18/2015   PROT 7.0 01/18/2015   ALBUMIN 3.7 01/18/2015   CALCIUM 9.4 01/18/2015   ANIONGAP 12 01/18/2015   No results found for: CHOL No results found for: HDL No results found for: LDLCALC No results found for: TRIG No results found for: CHOLHDL Lab Results  Component Value Date   HGBA1C 7.4 (H) 09/13/2006         Assessment & Plan:   Problem List Items Addressed This Visit      Unprioritized   Chest pain - Primary    ekg-- NSR Probably from anxiety / panic  attacks rto prn  Pt has lorazepam if needed        Relevant Orders   EKG 12-Lead (Completed)   Grief at loss of child    con't wellbutrin  Lorazepam prn  List of counselors/ psych given to pt      Pain in right wrist    Wrist splint -- ? cts  If no relief ---will refer to hand specialist          I have discontinued Sherryl L. Fan's LORazepam and azithromycin. I am also having her maintain her insulin aspart, buPROPion, albuterol, pantoprazole, sucralfate, metFORMIN, ibuprofen, ondansetron, PROAIR HFA, Insulin Glargine, ACCU-CHEK SMARTVIEW, ACCU-CHEK FASTCLIX LANCETS, and diazepam.  No orders of the defined types were placed in this encounter.   CMA served as Education administrator during this visit. History, Physical and Plan performed by medical provider. Documentation and orders reviewed and attested to.  [name], DO

## 2017-10-22 NOTE — Telephone Encounter (Signed)
Appt w/ PCP later today.

## 2017-10-22 NOTE — Assessment & Plan Note (Signed)
ekg-- NSR Probably from anxiety / panic attacks rto prn  Pt has lorazepam if needed

## 2017-12-05 ENCOUNTER — Ambulatory Visit (INDEPENDENT_AMBULATORY_CARE_PROVIDER_SITE_OTHER): Payer: BLUE CROSS/BLUE SHIELD | Admitting: Internal Medicine

## 2017-12-05 ENCOUNTER — Encounter: Payer: Self-pay | Admitting: Internal Medicine

## 2017-12-05 VITALS — BP 142/74 | HR 94 | Ht 70.0 in | Wt 271.2 lb

## 2017-12-05 DIAGNOSIS — E1142 Type 2 diabetes mellitus with diabetic polyneuropathy: Secondary | ICD-10-CM | POA: Diagnosis not present

## 2017-12-05 DIAGNOSIS — E1165 Type 2 diabetes mellitus with hyperglycemia: Secondary | ICD-10-CM | POA: Diagnosis not present

## 2017-12-05 DIAGNOSIS — R300 Dysuria: Secondary | ICD-10-CM | POA: Diagnosis not present

## 2017-12-05 LAB — POCT GLYCOSYLATED HEMOGLOBIN (HGB A1C)

## 2017-12-05 MED ORDER — INSULIN GLARGINE 300 UNIT/ML ~~LOC~~ SOPN: 52.0000 [IU] | PEN_INJECTOR | Freq: Two times a day (BID) | SUBCUTANEOUS | 11 refills | Status: DC

## 2017-12-05 NOTE — Progress Notes (Addendum)
Patient ID: Kimberly [name], female   DOB: 01-04-1961, 57 y.o.   MRN: [id number]   HPI: Kimberly [name] is a 57 y.o.-year-old female, referred by her PCP, Dr. Etter Sjogren, for management of DM2, dx in 2007-2008, insulin-dependent since 2008-2009, uncontrolled, without long term complications.  Last hemoglobin A1c was: 10/27/2016: HbA1c 8.0% Lab Results  Component Value Date   HGBA1C 7.4 (H) 09/13/2006   Pt is on a regimen of: - Metformin 1000 mg 2x a day, with meals - Lantus 52 units in am and 52 units at bedtime - Novolog 35 (but actually taking 25) units 3x a day, before b'fast and at bedtime! Tried Victoza >> GERD, chronic cough and Es pbs, severe AP  Pt checks her sugars 1x a day and they are: - am: 150-215 - 2h after b'fast: n/c - before lunch: n/c - 2h after lunch: n/c - before dinner: n/c - 2h after dinner: takes 15-20 units Novolog - bedtime: upper 200s  - nighttime: n/c No lows. Lowest sugar was 110; she has hypoglycemia awareness at 120.  Highest sugar was 350.  Glucometer: AccuChek nano  Pt's meals are: - Breakfast: shredded wheat with 2% milk - Lunch: PB sandwich, yoghurt - Dinner: chicken, beef, starch, green beens - Snacks: yoghurt, OJ or cranberry juice  - no CKD, last BUN/creatinine:  Lab Results  Component Value Date   BUN 7 01/18/2015   BUN 13 08/01/2013   CREATININE 0.70 01/18/2015   CREATININE 0.70 08/01/2013   - last set of lipids: No results found for: CHOL, HDL, LDLCALC, LDLDIRECT, TRIG, CHOLHDL   - last eye exam was in 06/2016. No DR. + blurry vision.  - + numbness and tingling in her feet - last 2 months.  Pt has FH of DM in M.  She has a h/o lung carcinoid in 2013.  She has a history of frequent sinus and bladder infections.  ROS: Constitutional: + Fatigue, + hot flashes, + poor sleep, + increased urination, + nocturia Eyes: + Occasional blurry vision, no xerophthalmia ENT: no sore throat, no nodules palpated in throat, no  dysphagia/odynophagia, no hoarseness Cardiovascular: no CP/+ SOB/no palpitations/leg swelling Respiratory: + Both cough/SOB Gastrointestinal: no N/V/+ heartburn, + diarrhea (IBS) Musculoskeletal: no muscle/joint aches Skin: no rashes, + hair loss Neurological: no tremors/numbness/tingling/dizziness, + headache Psychiatric: no depression/+ anxiety  Past Medical History:  Diagnosis Date  . Anxiety    takes Ativan as needed  . Bronchitis    hx of;last time > 1yrago  . Cancer ([facility]    part of left lower lung removed- lung ca 2013  . Colitis   . Colon polyps    hx of  . Complication of anesthesia    gas and MAC procedures  . Depression    takes Wellbutrin daily  . Diabetes mellitus    Novolog and Levemir daily  . [place]   . Eczema   . GERD (gastroesophageal reflux disease)    takes Protonix nightly  . Headache(784.0)    stress HA frequently  . IBS (irritable bowel syndrome)   . Lung mass    left upper lobe  . Pneumonia    walking pneumonia in 2007  . PONV (postoperative nausea and vomiting)   . Shortness of breath    certain times of the year  . Stress incontinence    Past Surgical History:  Procedure Laterality Date  . BIOPSY THYROID     pt has thyroid polyps another scan  in Mar 2013  . CAESAREAN SECTION  96/98/2000  . colonosocpy    . Rectal fistula  1993  . THORACOTOMY  10/26/2011   Procedure: THORACOTOMY MAJOR;  Surgeon: Gaye Pollack, MD;  Location: [facility] OR;  Service: Thoracic;  Laterality: Left;  . UTERINE FIBROID SURGERY     late 30's early 76's   Social History   Socioeconomic History  . Marital status: Single    Spouse name: Not on file  . Number of children: 4 (one deceased)  . Years of education: AAS  . Highest education level: Not on file  Occupational History  . Occupation:  Tech support  Social Needs  . Financial resource strain: Not on file  . Food insecurity:    Worry: Not on file    Inability: Not on file  .  Transportation needs:    Medical: Not on file    Non-medical: Not on file  Tobacco Use  . Smoking status: Former Smoker    Types: Cigarettes    Last attempt to quit: 08/27/1982    Years since quitting: 35.2  . Smokeless tobacco: Never Used  Substance and Sexual Activity  . Alcohol use: No  . Drug use: No  . Sexual activity: Not Currently    Birth control/protection: Post-menopausal  Lifestyle  . Physical activity:    Days per week: Not on file    Minutes per session: Not on file  . Stress: Not on file  Relationships  . Social connections:    Talks on phone: Not on file    Gets together: Not on file    Attends religious service: Not on file    Active member of club or organization: Not on file    Attends meetings of clubs or organizations: Not on file    Relationship status: Not on file  . Intimate partner violence:    Fear of current or ex partner: Not on file    Emotionally abused: Not on file    Physically abused: Not on file    Forced sexual activity: Not on file  Other Topics Concern  . Not on file  Social History Narrative   Lives at home w/ her sons   Right-handed   Caffeine: cup of coffee each morning      Does not regularly exercise.    Current Outpatient Medications on File Prior to Visit  Lantus 52 units twice a day  Medication Sig Dispense Refill  . ACCU-CHEK FASTCLIX LANCETS MISC   0  . ACCU-CHEK SMARTVIEW test strip   1  . albuterol (PROVENTIL) (2.5 MG/3ML) 0.083% nebulizer solution Take 2.5 mg by nebulization every 6 (six) hours as needed. For shortness of breath    . buPROPion (WELLBUTRIN XL) 150 MG 24 hr tablet Take 150 mg by mouth every evening.     . diazepam (VALIUM) 5 MG tablet Take 1 tablet (5 mg total) by mouth every 12 (twelve) hours as needed for anxiety. 30 tablet 1  . ibuprofen (ADVIL,MOTRIN) 200 MG tablet Take 200 mg by mouth every 6 (six) hours as needed for moderate pain.    Marland Kitchen insulin aspart (NOVOLOG FLEXPEN) 100 UNIT/ML injection Inject  25 Units into the skin 3 (three) times daily before meals.     . metFORMIN (GLUMETZA) 500 MG (MOD) 24 hr tablet Take 500-1,000 mg by mouth See admin instructions. Pt takes 1033m in the morning and 5093min the evening    . ondansetron (ZOFRAN ODT) 4 MG disintegrating tablet 9m67m  ODT q4 hours prn nausea/vomit 8 tablet 0  . pantoprazole (PROTONIX) 40 MG tablet Take 40 mg by mouth 2 (two) times daily.     Marland Kitchen PROAIR HFA 108 (90 BASE) MCG/ACT inhaler Inhale 2 puffs into the lungs every 6 (six) hours as needed. For shortness of breath 1 Inhaler 6  . sucralfate (CARAFATE) 1 G tablet Take 1 tablet (1 g total) by mouth 4 (four) times daily. (Patient taking differently: Take 1 g by mouth 4 (four) times daily as needed. ) 60 tablet 0   No current facility-administered medications on file prior to visit.    Allergies  Allergen Reactions  . Codeine Nausea And Vomiting    migraines  . Diflucan [Fluconazole] Other (See Comments)    Blisters  . Metoclopramide Hcl Other (See Comments)    Do not give at all;muscle jerking  . Penicillins     REACTION: unspecified  . Prednisone Nausea And Vomiting    migraines   Family History  Problem Relation Age of Onset  . Hyperlipidemia Mother   . Diabetes Mother   . Cancer Mother        kidney   . Cancer Father        liver  . Breast cancer Maternal Aunt   . Heart disease Maternal Grandmother   . Cancer Maternal Grandfather        kidney   . Heart disease Maternal Grandfather   . Hyperlipidemia Maternal Grandfather   . Breast cancer Other   . Anesthesia problems Sister   . Migraines Sister   . Hypotension Neg Hx   . Malignant hyperthermia Neg Hx   . Pseudochol deficiency Neg Hx     PE: BP (!) 142/74   Pulse 94   Ht 5' 10"  (1.778 m)   Wt 271 lb 3.2 oz (123 kg)   SpO2 97%   BMI 38.91 kg/m  Wt Readings from Last 3 Encounters:  10/10/17 272 lb (123.4 kg)  02/20/17 265 lb (120.2 kg)  06/07/16 270 lb 8 oz (122.7 kg)   Constitutional: overweight,  in NAD Eyes: PERRLA, EOMI, no exophthalmos ENT: moist mucous membranes, no thyromegaly, no cervical lymphadenopathy Cardiovascular: Tachycardia, RR, No MRG Respiratory: CTA B Gastrointestinal: abdomen soft, NT, ND, BS+ Musculoskeletal: no deformities, strength intact in all 4 Skin: moist, warm, no rashes Neurological: no tremor with outstretched hands, DTR normal in all 4  ASSESSMENT: 1. DM2, insulin-dependent, uncontrolled, with complications -Peripheral neuropathy  2.  Dysuria  PLAN:  1. Patient with long-standing, uncontrolled diabetes, on oral antidiabetic regimen, which became insufficient. HbA1c 8.9% (higher). -Upon questioning, she is not taking her NovoLog correctly, 15 minutes before the 3 meals of the day, but she is only taking a lower dose before breakfast and an even lower dose in the evening, as her sugars are high after dinner we discussed that NovoLog Insulin should be taken before the meals and I gave her a more flexible regimen. -Regarding her basal insulin, she is taking 104 units a day and I do not feel that she needs more.  However, I suggested to start Toujeo due to the fact that it is 3 times more concentrated than the Lantus, which allows for better absorption and more stable blood sugars -Advised her to stop juices -Given a new Accu-Chek guide meter and also coupons for Toujeo and NovoLog - I suggested to:  Patient Instructions  Please continue: - Metformin 1000 mg 2x a day with meals  Please try to change to: -  Toujeo 52 units 2x a day  Please try to move Novolog to 15 min before the 3 meals.  Change Novolog: - 25 units for a small meal - 30 units for a regular meal - 35 units for a large meal  Please return in 1.5 months with your sugar log.   - Strongly advised her to start checking sugars at different times of the day - check 2-3 times a day, rotating checks - given sugar log and advised how to fill it and to bring it at next appt  - given foot  care handout and explained the principles  - given instructions for hypoglycemia management "15-15 rule"  - advised for yearly eye exams  - We will check a CMP today - Return to clinic in 1.5 mo with sugar log   2.  Dysuria -She would like me to check a urinalysis as she complains of dysuria and increased urination  Office Visit on 12/05/2017  Component Date Value Ref Range Status  . Hemoglobin A1C 12/05/2017 8.9%   Final  . Glucose, Bld 12/05/2017 316* 65 - 99 mg/dL Final   Comment: . Whole blood, unspun or partially spun gel barrier tube was received more than 6 hours since collection. A  false elevation of K, Phos and LD as well as a false  decrease in glucose may occur due to prolonged contact  with red cells. . .            Fasting reference interval . For someone without known diabetes, a glucose value >125 mg/dL indicates that they may have diabetes and this should be confirmed with a follow-up test. .   . BUN 12/05/2017 13  7 - 25 mg/dL Final  . Creat 12/05/2017 0.82  0.50 - 1.05 mg/dL Final   Comment: For patients >28 years of age, the reference limit for Creatinine is approximately 13% higher for people identified as African-American. .   . GFR, Est Non African American 12/05/2017 79  > OR = 60 mL/min/1.75m Final  . GFR, Est African American 12/05/2017 92  > OR = 60 mL/min/1.765mFinal  . BUN/Creatinine Ratio 0419/50/9326OT APPLICABLE  6 - 22 (calc) Final  . Sodium 12/05/2017 136  135 - 146 mmol/L Final  . Potassium 12/05/2017 4.7  3.5 - 5.3 mmol/L Final   Comment: . Whole blood, unspun or partially spun gel barrier tube was received more than 6 hours since collection. A  false elevation of K, Phos and LD as well as a false  decrease in glucose may occur due to prolonged contact  with red cells. .   . Chloride 12/05/2017 99  98 - 110 mmol/L Final  . CO2 12/05/2017 26  20 - 32 mmol/L Final  . Calcium 12/05/2017 10.0  8.6 - 10.4 mg/dL Final  . Total  Protein 12/05/2017 7.1  6.1 - 8.1 g/dL Final  . Albumin 12/05/2017 4.1  3.6 - 5.1 g/dL Final  . Globulin 12/05/2017 3.0  1.9 - 3.7 g/dL (calc) Final  . AG Ratio 12/05/2017 1.4  1.0 - 2.5 (calc) Final  . Total Bilirubin 12/05/2017 0.5  0.2 - 1.2 mg/dL Final  . Alkaline phosphatase (APISO) 12/05/2017 138* 33 - 130 U/L Final  . AST 12/05/2017 25  10 - 35 U/L Final  . ALT 12/05/2017 22  6 - 29 U/L Final  . Color, Urine 12/05/2017 YELLOW  YELLOW Final  . APPearance 12/05/2017 CLEAR  CLEAR Final  . Specific Gravity, Urine 12/05/2017 1.029  1.001 - 1.03 Final  . pH 12/05/2017 5.5  5.0 - 8.0 Final  . Glucose, UA 12/05/2017 3+* NEGATIVE Final  . Bilirubin Urine 12/05/2017 NEGATIVE  NEGATIVE Final  . Ketones, ur 12/05/2017 NEGATIVE  NEGATIVE Final  . Hgb urine dipstick 12/05/2017 NEGATIVE  NEGATIVE Final  . Protein, ur 12/05/2017 NEGATIVE  NEGATIVE Final  . Nitrites, Initial 12/05/2017 NEGATIVE  NEGATIVE Final  . Leukocyte Esterase 12/05/2017 NEGATIVE  NEGATIVE Final  . WBC, UA 12/05/2017 0-5  0 - 5 /HPF Final  . RBC / HPF 12/05/2017 NONE SEEN  0 - 2 /HPF Final  . Squamous Epithelial / LPF 12/05/2017 0-5  < OR = 5 /HPF Final  . Bacteria, UA 12/05/2017 NONE SEEN  NONE SEEN /HPF Final  . Hyaline Cast 12/05/2017 NONE SEEN  NONE SEEN /LPF Final  . Specimen Integrity Compromised 12/05/2017    Final   Comment: . Whole blood, unspun or partially spun gel barrier tube received more than 2 hours since collection. A false decrease in glucose may occur due to prolonged contact  with red cells. .   . Reflexve Urine Culture 12/05/2017 NO CULTURE INDICATED   Final   No sign of UTI. Labs are ok except high glucose.  Philemon Kingdom, MD PhD [facility] Endocrinology

## 2017-12-05 NOTE — Patient Instructions (Addendum)
Please continue: - Metformin 1000 mg 2x a day with meals  Please try to change to: - Toujeo 52 units 2x a day  Please try to move Novolog to 15 min before the 3 meals.  Change Novolog: - 25 units for a small meal - 30 units for a regular meal - 35 units for a large meal  Please return in 1.5 months with your sugar log.   PATIENT INSTRUCTIONS FOR TYPE 2 DIABETES:  **Please join MyChart!** - see attached instructions about how to join if you have not done so already.  DIET AND EXERCISE Diet and exercise is an important part of diabetic treatment.  We recommended aerobic exercise in the form of brisk walking (working between 40-60% of maximal aerobic capacity, similar to brisk walking) for 150 minutes per week (such as 30 minutes five days per week) along with 3 times per week performing 'resistance' training (using various gauge rubber tubes with handles) 5-10 exercises involving the major muscle groups (upper body, lower body and core) performing 10-15 repetitions (or near fatigue) each exercise. Start at half the above goal but build slowly to reach the above goals. If limited by weight, joint pain, or disability, we recommend daily walking in a swimming pool with water up to waist to reduce pressure from joints while allow for adequate exercise.    BLOOD GLUCOSES Monitoring your blood glucoses is important for continued management of your diabetes. Please check your blood glucoses 2-4 times a day: fasting, before meals and at bedtime (you can rotate these measurements - e.g. one day check before the 3 meals, the next day check before 2 of the meals and before bedtime, etc.).   HYPOGLYCEMIA (low blood sugar) Hypoglycemia is usually a reaction to not eating, exercising, or taking too much insulin/ other diabetes drugs.  Symptoms include tremors, sweating, hunger, confusion, headache, etc. Treat IMMEDIATELY with 15 grams of Carbs: . 4 glucose tablets .  cup regular juice/soda . 2  tablespoons raisins . 4 teaspoons sugar . 1 tablespoon honey Recheck blood glucose in 15 mins and repeat above if still symptomatic/blood glucose <100.  RECOMMENDATIONS TO REDUCE YOUR RISK OF DIABETIC COMPLICATIONS: * Take your prescribed MEDICATION(S) * Follow a DIABETIC diet: Complex carbs, fiber rich foods, (monounsaturated and polyunsaturated) fats * AVOID saturated/trans fats, high fat foods, >2,300 mg salt per day. * EXERCISE at least 5 times a week for 30 minutes or preferably daily.  * DO NOT SMOKE OR DRINK more than 1 drink a day. * Check your FEET every day. Do not wear tightfitting shoes. Contact us if you develop an ulcer * See your EYE doctor once a year or more if needed * Get a FLU shot once a year * Get a PNEUMONIA vaccine once before and once after age 30 years  GOALS:  * Your Hemoglobin A1c of <7%  * fasting sugars need to be <130 * after meals sugars need to be <180 (2h after you start eating) * Your Systolic BP should be 466 or lower  * Your Diastolic BP should be 80 or lower  * Your HDL (Good Cholesterol) should be 40 or higher  * Your LDL (Bad Cholesterol) should be 100 or lower. * Your Triglycerides should be 150 or lower  * Your Urine microalbumin (kidney function) should be <30 * Your Body Mass Index should be 25 or lower    Please consider the following ways to cut down carbs and fat and increase fiber and micronutrients in  your diet: - substitute whole grain for white bread or pasta - substitute brown rice for white rice - substitute 90-calorie flat bread pieces for slices of bread when possible - substitute sweet potatoes or yams for white potatoes - substitute humus for margarine - substitute tofu for cheese when possible - substitute almond or rice milk for regular milk (would not drink soy milk daily due to concern for soy estrogen influence on breast cancer risk) - substitute dark chocolate for other sweets when possible - substitute water - can  add lemon or orange slices for taste - for diet sodas (artificial sweeteners will trick your body that you can eat sweets without getting calories and will lead you to overeating and weight gain in the long run) - do not skip breakfast or other meals (this will slow down the metabolism and will result in more weight gain over time)  - can try smoothies made from fruit and almond/rice milk in am instead of regular breakfast - can also try old-fashioned (not instant) oatmeal made with almond/rice milk in am - order the dressing on the side when eating salad at a restaurant (pour less than half of the dressing on the salad) - eat as little meat as possible - can try juicing, but should not forget that juicing will get rid of the fiber, so would alternate with eating raw veg./fruits or drinking smoothies - use as little oil as possible, even when using olive oil - can dress a salad with a mix of balsamic vinegar and lemon juice, for e.g. - use agave nectar, stevia sugar, or regular sugar rather than artificial sweateners - steam or broil/roast veggies  - snack on veggies/fruit/nuts (unsalted, preferably) when possible, rather than processed foods - reduce or eliminate aspartame in diet (it is in diet sodas, chewing gum, etc) Read the labels!  Try to read Dr. Janene Harvey book: "Program for Reversing Diabetes" for other ideas for healthy eating.

## 2017-12-06 LAB — COMPLETE METABOLIC PANEL WITH GFR
AG Ratio: 1.4 (calc) (ref 1.0–2.5)
ALBUMIN MSPROF: 4.1 g/dL (ref 3.6–5.1)
ALKALINE PHOSPHATASE (APISO): 138 U/L — AB (ref 33–130)
ALT: 22 U/L (ref 6–29)
AST: 25 U/L (ref 10–35)
BILIRUBIN TOTAL: 0.5 mg/dL (ref 0.2–1.2)
BUN: 13 mg/dL (ref 7–25)
CHLORIDE: 99 mmol/L (ref 98–110)
CO2: 26 mmol/L (ref 20–32)
Calcium: 10 mg/dL (ref 8.6–10.4)
Creat: 0.82 mg/dL (ref 0.50–1.05)
GFR, Est African American: 92 mL/min/{1.73_m2} (ref >=60)
GFR, Est Non African American: 79 mL/min/{1.73_m2} (ref >=60)
GLUCOSE: 316 mg/dL — AB (ref 65–99)
Globulin: 3 g/dL (calc) (ref 1.9–3.7)
Potassium: 4.7 mmol/L (ref 3.5–5.3)
Sodium: 136 mmol/L (ref 135–146)
Total Protein: 7.1 g/dL (ref 6.1–8.1)

## 2017-12-06 LAB — URINALYSIS W MICROSCOPIC + REFLEX CULTURE
BILIRUBIN URINE: NEGATIVE
Bacteria, UA: NONE SEEN /HPF
HYALINE CAST: NONE SEEN /LPF
Hgb urine dipstick: NEGATIVE
Ketones, ur: NEGATIVE
Leukocyte Esterase: NEGATIVE
NITRITES URINE, INITIAL: NEGATIVE
PH: 5.5 (ref 5.0–8.0)
Protein, ur: NEGATIVE
RBC / HPF: NONE SEEN /HPF (ref 0–2)
Specific Gravity, Urine: 1.029 (ref 1.001–1.03)

## 2017-12-06 LAB — SPECIMEN COMPROMISED

## 2017-12-06 LAB — NO CULTURE INDICATED

## 2017-12-10 ENCOUNTER — Telehealth: Payer: Self-pay | Admitting: Internal Medicine

## 2017-12-10 NOTE — Telephone Encounter (Signed)
Patient is calling for the result of her urine test.

## 2017-12-10 NOTE — Telephone Encounter (Addendum)
Spoke to patient.  Informed lab results were sent to [facility]. Pt states she is unable to access MyChart. Advised to call phone number for tech support. Gave lab results.  Patient verbalized understanding.

## 2018-01-16 LAB — HM DIABETES EYE EXAM

## 2018-01-17 ENCOUNTER — Encounter: Payer: Self-pay | Admitting: Internal Medicine

## 2018-01-22 ENCOUNTER — Other Ambulatory Visit: Payer: Self-pay | Admitting: Family Medicine

## 2018-01-22 NOTE — Telephone Encounter (Signed)
Copied from [place] [id number]. Topic: Quick Communication - Rx Refill/Question >> Jan 22, 2018  2:36 PM Synthia Innocent wrote: Medication: buPROPion (WELLBUTRIN XL) 150 MG 24 hr tablet and pantoprazole (PROTONIX) 40 MG tablet   Has the patient contacted their pharmacy? Yes.  Told to call provider another order previous (Agent: If no, request that the patient contact the pharmacy for the refill.) (Agent: If yes, when and what did the pharmacy advise?)  Preferred Pharmacy (with phone number or street name): [facility]  Agent: Please be advised that RX refills may take up to 3 business days. We ask that you follow-up with your pharmacy.

## 2018-01-23 ENCOUNTER — Other Ambulatory Visit: Payer: Self-pay | Admitting: Surgery

## 2018-01-23 DIAGNOSIS — C3490 Malignant neoplasm of unspecified part of unspecified bronchus or lung: Secondary | ICD-10-CM

## 2018-01-23 MED ORDER — PANTOPRAZOLE SODIUM 40 MG PO TBEC: 40.0000 mg | DELAYED_RELEASE_TABLET | Freq: Two times a day (BID) | ORAL | 5 refills | Status: DC

## 2018-01-23 MED ORDER — BUPROPION HCL ER (XL) 150 MG PO TB24: 150.0000 mg | ORAL_TABLET | Freq: Every evening | ORAL | 1 refills | Status: DC

## 2018-01-23 NOTE — Telephone Encounter (Signed)
LOV  10/22/17 Dr. Carollee Herter Medications do not have a provider.

## 2018-01-31 ENCOUNTER — Telehealth: Payer: Self-pay | Admitting: Family Medicine

## 2018-01-31 NOTE — Telephone Encounter (Signed)
Pt states she was getting this from her previous dr, has been on this med for years, and takes every day. Dr Etter Sjogren has never filled, but she sees dr Etter Sjogren as her pcp now.  Pt states she never takes the valium and this med together.  Pt only got the valium when she lost her son this past year, to help with that.  Also pt states the drug screen instructions were not left.

## 2018-01-31 NOTE — Telephone Encounter (Signed)
Reviewed chart and lorazepam is no longer on current med list. Diazepam 70m every 12 hours is on current list. Attempted to verify request with pt and left detailed message on pt's voicemail to call with clarification. [facility] to discuss with pt.

## 2018-01-31 NOTE — Telephone Encounter (Signed)
She absolutely can not have both-- they are basically the same med She can take one

## 2018-01-31 NOTE — Telephone Encounter (Signed)
Copied from [place] [id number]. Topic: Quick Communication - Rx Refill/Question >> Jan 31, 2018  9:15 AM Scherrie Gerlach wrote: Medication: LORazepam (ATIVAN) 0.5 MG tablet (BID)  Pt states she thinks she was to do a drug screen prior to this refill. Pt has appt 04/10/18. Is it ok for her to wait until this appt for this screen, or should pt come in now? Pt would like to know and states ok to leave message on her cell phone voice mail.  [id number] [address], [place] [phone] (Phone) [phone] (Fax)

## 2018-02-04 NOTE — Telephone Encounter (Signed)
Left message on machine to call back  

## 2018-02-05 MED ORDER — LORAZEPAM 0.5 MG PO TABS: 0.5000 mg | ORAL_TABLET | Freq: Two times a day (BID) | ORAL | 0 refills | Status: DC | PRN

## 2018-02-05 NOTE — Telephone Encounter (Signed)
Patient notified not to take diazepam and lorazepam together.  She stated that she had told the provider that she was on both tablets at her last visit.  She will stay on the lorazepam 0.85m.  rx sent in.

## 2018-02-19 ENCOUNTER — Ambulatory Visit: Payer: Self-pay | Admitting: Surgery

## 2018-02-19 ENCOUNTER — Other Ambulatory Visit: Payer: Self-pay

## 2018-03-04 ENCOUNTER — Other Ambulatory Visit: Payer: Self-pay | Admitting: Family Medicine

## 2018-03-05 ENCOUNTER — Ambulatory Visit
Admission: RE | Admit: 2018-03-05 | Discharge: 2018-03-05 | Disposition: A | Discharge status: Home / Self Care | Payer: BLUE CROSS/BLUE SHIELD | Source: Ambulatory Visit | Attending: Surgery | Admitting: Surgery

## 2018-03-05 DIAGNOSIS — C3490 Malignant neoplasm of unspecified part of unspecified bronchus or lung: Secondary | ICD-10-CM

## 2018-03-05 NOTE — Telephone Encounter (Signed)
Requesting: ativan 0.54m bid prn Contract: none found UDS: none found Last OV: 10/22/17 Next Ov: 04/10/18 Last refill: 02/05/18 #60, 0RF Database: no discrepancies found  Order pended for review.

## 2018-03-13 ENCOUNTER — Ambulatory Visit: Payer: BLUE CROSS/BLUE SHIELD | Admitting: Internal Medicine

## 2018-03-13 ENCOUNTER — Encounter: Payer: Self-pay | Admitting: Internal Medicine

## 2018-03-13 VITALS — BP 156/88 | HR 97 | Wt 272.0 lb

## 2018-03-13 DIAGNOSIS — E1142 Type 2 diabetes mellitus with diabetic polyneuropathy: Secondary | ICD-10-CM | POA: Diagnosis not present

## 2018-03-13 DIAGNOSIS — E1165 Type 2 diabetes mellitus with hyperglycemia: Secondary | ICD-10-CM

## 2018-03-13 LAB — POCT GLYCOSYLATED HEMOGLOBIN (HGB A1C): Hemoglobin A1C: 9 % — AB (ref 4.0–5.6)

## 2018-03-13 MED ORDER — INSULIN REGULAR HUMAN (CONC) 500 UNIT/ML ~~LOC~~ SOPN: PEN_INJECTOR | SUBCUTANEOUS | 3 refills | Status: DC

## 2018-03-13 NOTE — Progress Notes (Signed)
Patient ID: Kimberly [name], female   DOB: 09/01/1960, 57 y.o.   MRN: [id number]   HPI: Kimberly [name] is a 57 y.o.-year-old female, returning for follow-up for DM2, dx in 2007-2008, insulin-dependent since 2008-2009, uncontrolled, without long term complications.  Last visit 3 months ago.  Last hemoglobin A1c was: Lab Results  Component Value Date   HGBA1C 8.9% 12/05/2017   HGBA1C 7.4 (H) 09/13/2006  10/27/2016: HbA1c 8.0%  Pt was on a regimen of: - Metformin 1000 mg 2x a day, with meals - Lantus 52 units in am and 52 units at bedtime - Novolog 35 (but actually taking 25) units 3x a day, before b'fast and at bedtime! Tried Victoza >> GERD, chronic cough and Es pbs, severe AP  At last visit, we changed to: - Metformin 1000 mg 2x a day with meals  - Lantus (did not start Toujeo yet) 52 units 2x a day - Novolog - injected 15 minutes before a meal - misses lunchtime dose at work! - 25 units for a small meal - 30 units for a regular meal - 35 units for a large meal Takes 10 units Novolog prn and 20 units at bedtime prn.  Pt checks her sugars 1-2X a day: - am: 150-215 >> 180, 200-213 - 2h after b'fast: n/c - before lunch: n/c >> low 200 - 2h after lunch: n/c - before dinner: n/c >> ? - 2h after dinner: >> n/c - bedtime: upper 200s >> mid 200s; takes 10-20 units Novolog - nighttime: n/c Lowest sugar was 110 >> 180; she has hypoglycemia awareness at 120.  Highest sugar was 350 >> 200s.  Glucometer: AccuChek nano >> Accu-Chek guide  Pt's meals are: - Breakfast: shredded wheat with 2% milk - Lunch: PB sandwich, yoghurt - Dinner: chicken, beef, starch, green beens - Snacks: yoghurt, OJ or cranberry juice  -No CKD, last BUN/creatinine:  Lab Results  Component Value Date   BUN 13 12/05/2017   BUN 7 01/18/2015   CREATININE 0.82 12/05/2017   CREATININE 0.70 01/18/2015   - ? HL; last set of lipids: No results found for: CHOL, HDL, LDLCALC, LDLDIRECT, TRIG, CHOLHDL   - last  eye exam was in 12/2017: No DR. she does complain of blurry vision.    - + numbness and tingling in her feet  Pt has FH of DM in M.  She has a h/o lung carcinoid in 2013.  She has a history of frequent sinus and bladder infections.  She also has a history of thyroid nodules, previously followed by Dr. Tamala Julian.  Reviewed the latest thyroid ultrasound from 2016.  She has several nodules, of which some are slightly smaller and some slightly larger.  She has occasional dysphagia, which is chronic, and not increased.  ROS: Constitutional: no weight gain/no weight loss, + fatigue, + subjective hyperthermia, no subjective hypothermia, + nocturia Eyes: no blurry vision, no xerophthalmia ENT: no sore throat, no nodules palpated in throat, + dysphagia, no odynophagia, no hoarseness Cardiovascular: no CP/+ SOB/no palpitations/no leg swelling Respiratory: no cough/+ SOB/no wheezing Gastrointestinal: no N/no V/no D/no C/no acid reflux Musculoskeletal: no muscle aches/+ joint aches Skin: no rashes, no hair loss Neurological: no tremors/+ numbness/+ tingling/no dizziness, + headaches  I reviewed pt's medications, allergies, PMH, social hx, family hx, and changes were documented in the history of present illness. Otherwise, unchanged from my initial visit note.  Past Medical History:  Diagnosis Date  . Anxiety    takes Ativan as needed  .  Bronchitis    hx of;last time > 15yrago  . Cancer ([facility]    part of left lower lung removed- lung ca 2013  . Colitis   . Colon polyps    hx of  . Complication of anesthesia    gas and MAC procedures  . Depression    takes Wellbutrin daily  . Diabetes mellitus    Novolog and Levemir daily  . DM[place]di[place], type 2) [place]   . Eczema   . GERD (gastroesophageal reflux disease)    takes Protonix nightly  . Headache(784.0)    stress HA frequently  . IBS (irritable bowel syndrome)   . Lung mass    left upper lobe  . Pneumonia    walking pneumonia in  2007  . PONV (postoperative nausea and vomiting)   . Shortness of breath    certain times of the year  . Stress incontinence    Past Surgical History:  Procedure Laterality Date  . BIOPSY THYROID     pt has thyroid polyps another scan in Mar 2013  . CAESAREAN SECTION  96/98/2000  . colonosocpy    . Rectal fistula  1993  . THORACOTOMY  10/26/2011   Procedure: THORACOTOMY MAJOR;  Surgeon: BGaye Pollack MD;  [facility];  Service: Thoracic;  Laterality: Left;  . UTERINE FIBROID SURGERY     late 30's early 465's  Social History   Socioeconomic History  . Marital status: Single    Spouse name: Not on file  . Number of children: 4 (one deceased)  . Years of education: AAS  . Highest education level: Not on file  Occupational History  . Occupation:  Tech support  Social Needs  . Financial resource strain: Not on file  . Food insecurity:    Worry: Not on file    Inability: Not on file  . Transportation needs:    Medical: Not on file    Non-medical: Not on file  Tobacco Use  . Smoking status: Former Smoker    Types: Cigarettes    Last attempt to quit: 08/27/1982    Years since quitting: 35.2  . Smokeless tobacco: Never Used  Substance and Sexual Activity  . Alcohol use: No  . Drug use: No  . Sexual activity: Not Currently    Birth control/protection: Post-menopausal  Lifestyle  . Physical activity:    Days per week: Not on file    Minutes per session: Not on file  . Stress: Not on file  Relationships  . Social connections:    Talks on phone: Not on file    Gets together: Not on file    Attends religious service: Not on file    Active member of club or organization: Not on file    Attends meetings of clubs or organizations: Not on file    Relationship status: Not on file  . Intimate partner violence:    Fear of current or ex partner: Not on file    Emotionally abused: Not on file    Physically abused: Not on file    Forced sexual activity: Not on file  Other  Topics Concern  . Not on file  Social History Narrative   Lives at home w/ her sons   Right-handed   Caffeine: cup of coffee each morning      Does not regularly exercise.    Current Outpatient Medications  Medication Sig Dispense Refill  . ACCU-CHEK FASTCLIX LANCETS MISC   0  .  ACCU-CHEK SMARTVIEW test strip   1  . albuterol (PROVENTIL) (2.5 MG/3ML) 0.083% nebulizer solution Take 2.5 mg by nebulization every 6 (six) hours as needed. For shortness of breath    . buPROPion (WELLBUTRIN XL) 150 MG 24 hr tablet Take 1 tablet (150 mg total) by mouth every evening. 90 tablet 1  . ibuprofen (ADVIL,MOTRIN) 200 MG tablet Take 200 mg by mouth every 6 (six) hours as needed for moderate pain.    Marland Kitchen insulin aspart (NOVOLOG FLEXPEN) 100 UNIT/ML injection Inject 25 Units into the skin 3 (three) times daily before meals.     . Insulin Glargine (TOUJEO MAX SOLOSTAR) 300 UNIT/ML SOPN Inject 52 Units into the skin 2 (two) times daily. 6 pen 11  . LORazepam (ATIVAN) 0.5 MG tablet TAKE 1 TABLET BY MOUTH TWICE DAILY AS NEEDED FOR ANXIETY 60 tablet 0  . metFORMIN (GLUMETZA) 500 MG (MOD) 24 hr tablet Take 500-1,000 mg by mouth See admin instructions. Pt takes 1052m in the morning and 50214min the evening    . ondansetron (ZOFRAN ODT) 4 MG disintegrating tablet 14m43mDT q4 hours prn nausea/vomit 8 tablet 0  . pantoprazole (PROTONIX) 40 MG tablet Take 1 tablet (40 mg total) by mouth 2 (two) times daily. 60 tablet 5  . PROAIR HFA 108 (90 BASE) MCG/ACT inhaler Inhale 2 puffs into the lungs every 6 (six) hours as needed. For shortness of breath 1 Inhaler 6  . sucralfate (CARAFATE) 1 G tablet Take 1 tablet (1 g total) by mouth 4 (four) times daily. (Patient taking differently: Take 1 g by mouth 4 (four) times daily as needed. ) 60 tablet 0   No current facility-administered medications for this visit.    Allergies  Allergen Reactions  . Codeine Nausea And Vomiting    migraines  . Diflucan [Fluconazole] Other (See  Comments)    Blisters  . Metoclopramide Hcl Other (See Comments)    Do not give at all;muscle jerking  . Penicillins     REACTION: unspecified  . Prednisone Nausea And Vomiting    migraines   Family History  Problem Relation Age of Onset  . Hyperlipidemia Mother   . Diabetes Mother   . Cancer Mother        kidney   . Cancer Father        liver  . Breast cancer Maternal Aunt   . Heart disease Maternal Grandmother   . Cancer Maternal Grandfather        kidney   . Heart disease Maternal Grandfather   . Hyperlipidemia Maternal Grandfather   . Breast cancer Other   . Anesthesia problems Sister   . Migraines Sister   . Hypotension Neg Hx   . Malignant hyperthermia Neg Hx   . Pseudochol deficiency Neg Hx     PE: BP (!) 156/88 (BP Location: Left Arm, Patient Position: Sitting, Cuff Size: Normal)   Pulse 97   Wt 272 lb (123.4 kg)   SpO2 97%   BMI 39.03 kg/m  Wt Readings from Last 3 Encounters:  03/13/18 272 lb (123.4 kg)  12/05/17 271 lb 3.2 oz (123 kg)  10/10/17 272 lb (123.4 kg)   Constitutional: overweight, in NAD Eyes: PERRLA, EOMI, no exophthalmos ENT: moist mucous membranes, no thyromegaly, no cervical lymphadenopathy Cardiovascular: tachycardia, RR, No MRG Respiratory: CTA B Gastrointestinal: abdomen soft, NT, ND, BS+ Musculoskeletal: no deformities, strength intact in all 4 Skin: moist, warm, no rashes Neurological: no tremor with outstretched hands, DTR normal in all  4  ASSESSMENT: 1. DM2, insulin-dependent, uncontrolled, with complications -Peripheral neuropathy  PLAN:  1. Patient with long-standing, uncontrolled, type 2 diabetes, on oral antidiabetic regimen, with metformin, along with basal-bolus insulin regimen.  At last visit she was taking a low dose of m NovoLog, and we increased this since her sugars were higher in the second half of the day.  She was taking a large dose of NovoLog at bedtime due to the high sugars at that time  and I strongly advised  her to stop this practice to avoid low blood sugars overnight.  We also changed from Lantus to [facility]  for better absorption from the injection site due to the high dose injected. I also advised her to s stop drinking juice. - At last visit, we gave her a Accu-Chek guide meter and also coupons for Toujeo and NovoLog - At this visit, sugars remain high, despite using higher doses of NovoLog (she injects 20 to 25 units before the meal and 10 units later, if the sugars are high right after the meal) and she also needs to do 10 to 20 units corrections through  at night and early morning.  Overall, she is using almost 200 units of insulin per day, with still poor control.  At this point, we discussed about switching to U500 insulin.  She agrees with this insulin formulation and I advised her to inject it 30 minutes before meals.  We will start at lower doses then calculated and she will need to increase the doses if the sugars are not controlled.  I advised her to check her sugars more frequently after she starts this type of insulin. - I suggested to:  Patient Instructions  Please continue: - Metformin 1000 mg 2x a day with meals   Stop Toujeo, NovoLog.  Start U500 insulin - 30 min before meals: - before b'fast: 50-60 units - before lunch: 40-50 units - before dinner: 40-50 units  Please return in 3 months with your sugar log.   - today, HbA1c is 9% (higher) - continue checking sugars at different times of the day - check 3x a day, rotating checks - advised for yearly eye exams >> she is UTD - Return to clinic in 3 mo with sugar log   - time spent with the patient: 30 minutes, of which >50% was spent in reviewing her sugars, reviewing previous labs, evaluations, and insulin doses, designing the new insulin treatment for her, explaining the concept of U500 insulin, pharmacodynamics, and expected benefits, and developing a plan to further investigate and treat it; she had a number of questions which  I addressed.  Philemon Kingdom, MD PhD [facility] Endocrinology

## 2018-03-13 NOTE — Addendum Note (Signed)
Addended by: Drucilla Schmidt on: 03/13/2018 02:34 PM   Modules accepted: Orders

## 2018-03-13 NOTE — Patient Instructions (Addendum)
Please continue: - Metformin 1000 mg 2x a day with meals   Stop Toujeo, NovoLog.  Start U500 insulin - 30 min before meals: - before b'fast: 50-60 units - before lunch: 40-50 units - before dinner: 40-50 units  Please return in 3 months with your sugar log.

## 2018-03-17 ENCOUNTER — Ambulatory Visit: Payer: BLUE CROSS/BLUE SHIELD | Admitting: Family Medicine

## 2018-03-17 ENCOUNTER — Encounter: Payer: Self-pay | Admitting: Family Medicine

## 2018-03-17 ENCOUNTER — Ambulatory Visit: Payer: Self-pay | Admitting: *Deleted

## 2018-03-17 VITALS — BP 138/80 | HR 103 | Temp 97.8°F | Ht 70.0 in | Wt 275.1 lb

## 2018-03-17 DIAGNOSIS — R079 Chest pain, unspecified: Secondary | ICD-10-CM | POA: Diagnosis not present

## 2018-03-17 DIAGNOSIS — F418 Other specified anxiety disorders: Secondary | ICD-10-CM

## 2018-03-17 DIAGNOSIS — R0602 Shortness of breath: Secondary | ICD-10-CM

## 2018-03-17 NOTE — Patient Instructions (Addendum)
Coping skills Choose 5 that work for you:  Take a deep breath  Count to 20  Read a book  Do a puzzle  Meditate  Bake  Sing  Knit  Garden  Pray  Go outside  Call a friend  Listen to music  Take a walk  Color  Send a note  Take a bath  Watch a movie  Be alone in a quiet place  Pet an animal  Visit a friend  Journal  Exercise  Stretch   I don't think this is related to your heart or lungs.  Let us know if you need anything.

## 2018-03-17 NOTE — Telephone Encounter (Signed)
Pt reports mild left sided chest "Discomfort" at left breast area x 2 months. States was intermittent, past 2 days constant; 4/10. Pt states "It may be stress related". Takes ativan daily.  H/O asthma, reports "A little more short of breath." Denies any other symptoms; no nausea, dizziness, diaphoresis. Also reports saw endocrinologist yesterday and B/P "High for me." 152/85. H/O anxiety, GERD, asthma, DM. Same day appt made with Dr. Nani Ravens. Care advise given; directed to ED if symptoms worsen.  Reason for Disposition . [1] Chest pain lasts > 5 minutes AND [2] occurred > 3 days ago (72 hours) AND [3] NO chest pain or cardiac symptoms now    X 2 months.  Answer Assessment - Initial Assessment Questions 1. LOCATION: "Where does it hurt?"       Left breast area, into back 2. RADIATION: "Does the pain go anywhere else?" (e.g., into neck, jaw, arms, back)     Into back 3. ONSET: "When did the chest pain begin?" (Minutes, hours or days)      2 months ago 4. PATTERN "Does the pain come and go, or has it been constant since it started?"  "Does it get worse with exertion?"      Worse with stress, intermittent, constant last few days 5. DURATION: "How long does it last" (e.g., seconds, minutes, hours)     [place] last few days 6. SEVERITY: "How bad is the pain?"  (e.g., Scale 1-10; mild, moderate, or severe)    - MILD (1-3): doesn't interfere with normal activities     - MODERATE (4-7): interferes with normal activities or awakens from sleep    - SEVERE (8-10): excruciating pain, unable to do any normal activities       Discomfort 4/10 7. CARDIAC RISK FACTORS: "Do you have any history of heart problems or risk factors for heart disease?" (e.g., prior heart attack, angina; high blood pressure, diabetes, being overweight, high cholesterol, smoking, or strong family history of heart disease)      8. PULMONARY RISK FACTORS: "Do you have any history of lung disease?"  (e.g., blood clots in lung, asthma,  emphysema, birth control pills)      9. CAUSE: "What do you think is causing the chest pain?"     Stress possibly     10. OTHER SYMPTOMS: "Do you have any other symptoms?" (e.g., dizziness, nausea, vomiting, sweating, fever, difficulty breathing, cough)       SOB, H/O asthma  Protocols used: CHEST PAIN-A-AH

## 2018-03-17 NOTE — Progress Notes (Signed)
Pre visit review using our clinic review tool, if applicable. No additional management support is needed unless otherwise documented below in the visit note. 

## 2018-03-17 NOTE — Progress Notes (Signed)
Chief Complaint  Patient presents with  . Chest Pain  . Shortness of Breath  . Stress    Subjective: Patient is a 57 y.o. female here for CP, SOB, and stress.  This is something that has been going on since February of this year.  1 of her sons hung himself.  Not long after, another son tried to hang himself.  Family members have been dealing with mental health issues throughout this time.  She has been very stressed.  Ativan has been helpful.  She is also taking Wellbutrin.  She is failed several SSRIs, SNRIs, and buspirone.  She has been having chest pressure that is constant.  It does not radiate and is on the left side of her chest.  Stressful thoughts make it worse.  Nothing else makes it worse.  Nothing makes it better.  She describes it as tightness.  Carafate was not helpful nor was albuterol.  It is not brought on by exertion.  She is not a smoker.  Other than diabetes, no significant cardiac disease.  She had a couple of grandparents who had heart attacks in their 56s.  No other significant heart disease in the family.  She does realize this could be related to her situation, however would like to rule out heart involvement.  ROS: Heart: +chest pain  Lungs: Denies SOB   Past Medical History:  Diagnosis Date  . Anxiety    takes Ativan as needed  . Bronchitis    hx of;last time > 13yrago  . Cancer ([facility]    part of left lower lung removed- lung ca 2013  . Colitis   . Colon polyps    hx of  . Complication of anesthesia    gas and MAC procedures  . Depression    takes Wellbutrin daily  . Diabetes mellitus    Novolog and Levemir daily  . DM[place] (di[place], [place] 2[place]   . Eczema   . GERD (gastroesophageal reflux disease)    takes Protonix nightly  . Headache(784.0)    stress HA frequently  . IBS (irritable bowel syndrome)   . Lung mass    left upper lobe  . Pneumonia    walking pneumonia in 2007  . PONV (postoperative nausea and vomiting)   . Shortness of  breath    certain times of the year  . Stress incontinence     Objective: BP 138/80 (BP Location: Left Arm, Patient Position: Sitting, Cuff Size: Large)   Pulse (!) 103   Temp 97.8 F (36.6 C) (Oral)   Ht _0  (1.778 m)   Wt 275 lb 2 oz (124.8 kg)   SpO2 98%   BMI 39.48 kg/m  General: Awake, appears stated age HEENT: MMM, EOMi Heart: RRR, no bruits, no lower extremity edema Lungs: CTAB, no rales, wheezes or rhonchi. No accessory muscle use MSK: CP is not producible to palpation Psych: Age appropriate judgment and insight, normal affect and mood  Assessment and Plan: Situational anxiety  Chest pain, unspecified type - Plan: EKG 12-Lead  SOB (shortness of breath) - Plan: EKG 12-Lead  Counseling is too expensive.  We discussed adding a medicine, however she would rather keep things as is.  She is reassured by today's findings.  This does not sound like typical chest pain. Follow-up as originally scheduled with her regular PCP. The patient voiced understanding and agreement to the plan.  [place] DO 03/17/18  12:51 PM

## 2018-03-26 ENCOUNTER — Ambulatory Visit: Payer: BLUE CROSS/BLUE SHIELD | Admitting: Surgery

## 2018-03-26 ENCOUNTER — Encounter: Payer: Self-pay | Admitting: Surgery

## 2018-03-26 ENCOUNTER — Other Ambulatory Visit: Payer: Self-pay

## 2018-03-26 VITALS — BP 139/79 | HR 78 | Resp 16 | Ht 70.0 in | Wt 275.0 lb

## 2018-03-26 DIAGNOSIS — Z09 Encounter for follow-up examination after completed treatment for conditions other than malignant neoplasm: Secondary | ICD-10-CM

## 2018-03-26 DIAGNOSIS — D3A090 Benign carcinoid tumor of the bronchus and lung: Secondary | ICD-10-CM | POA: Diagnosis not present

## 2018-03-26 NOTE — Progress Notes (Signed)
HPI:  The patient returns today for routine surveillance followup status post left thoracotomy and wedge resection of a left lower lobe carcinoid tumor on 10/26/2011. This was a well-differentiated carcinoid with negative resection margins. She also had a 4 mm RUL nodule that had been seen on a previous CT but was not seen on subsequent CT of the chest.  She has had a very stressful year having had 1 of her sons commit suicide by hanging himself and another son attempting to hang himself.  She has subsequently developed chest pressure and discomfort as well as shortness of breath and was evaluated by cardiology with negative electrocardiograms.  It was felt that this was most likely stress.  She denies cough or sputum production, no hemoptysis.     Current Outpatient Medications  Medication Sig Dispense Refill  . ACCU-CHEK FASTCLIX LANCETS MISC   0  . ACCU-CHEK SMARTVIEW test strip   1  . albuterol (PROVENTIL) (2.5 MG/3ML) 0.083% nebulizer solution Take 2.5 mg by nebulization every 6 (six) hours as needed. For shortness of breath    . buPROPion (WELLBUTRIN XL) 150 MG 24 hr tablet Take 1 tablet (150 mg total) by mouth every evening. 90 tablet 1  . ibuprofen (ADVIL,MOTRIN) 200 MG tablet Take 200 mg by mouth every 6 (six) hours as needed for moderate pain.    Marland Kitchen insulin aspart (NOVOLOG FLEXPEN) 100 UNIT/ML injection Inject 25 Units into the skin 3 (three) times daily before meals.     . Insulin Glargine (TOUJEO MAX SOLOSTAR) 300 UNIT/ML SOPN Inject 52 Units into the skin 2 (two) times daily. 6 pen 11  . insulin regular human CONCENTRATED (HUMULIN R U-500 KWIKPEN) 500 UNIT/ML kwikpen Inject 40-60 units 3x a day under skin 4 pen 3  . LORazepam (ATIVAN) 0.5 MG tablet TAKE 1 TABLET BY MOUTH TWICE DAILY AS NEEDED FOR ANXIETY 60 tablet 0  . metFORMIN (GLUMETZA) 500 MG (MOD) 24 hr tablet Take 500-1,000 mg by mouth See admin instructions. Pt takes 1041m in the morning and 5013min the evening    .  pantoprazole (PROTONIX) 40 MG tablet Take 1 tablet (40 mg total) by mouth 2 (two) times daily. 60 tablet 5  . PROAIR HFA 108 (90 BASE) MCG/ACT inhaler Inhale 2 puffs into the lungs every 6 (six) hours as needed. For shortness of breath 1 Inhaler 6  . sucralfate (CARAFATE) 1 G tablet Take 1 tablet (1 g total) by mouth 4 (four) times daily. (Patient taking differently: Take 1 g by mouth 4 (four) times daily as needed. ) 60 tablet 0   No current facility-administered medications for this visit.      Physical Exam: BP 139/79 (BP Location: Left Arm, Patient Position: Sitting, Cuff Size: Large)   Pulse 78   Resp 16   Ht 5' 10"  (1.778 m)   Wt 275 lb (124.7 kg)   SpO2 98% Comment: ON RA  BMI 39.46 kg/m  She looks well. There is no cervical or supraclavicular adenopathy. Cardiac exam shows a regular rate and rhythm with normal heart sounds. Lungs are clear.  Diagnostic Tests:  CLINICAL DATA:  Left lung cancer.  EXAM: CT CHEST WITHOUT CONTRAST  TECHNIQUE: Multidetector CT imaging of the chest was performed following the standard protocol without IV contrast.  COMPARISON:  02/20/2017.  FINDINGS: Cardiovascular: Atherosclerotic calcification of the arterial vasculature. Heart size normal. No pericardial effusion.  Mediastinum/Nodes: Subcentimeter low-attenuation lesion in the right lobe of the thyroid. Mediastinal lymph nodes are  not enlarged by CT size criteria. Hilar regions are difficult to evaluate without IV contrast. No axillary adenopathy. Esophagus is grossly unremarkable.  Lungs/Pleura: Postoperative scarring in the left lower lobe. Lungs are otherwise clear. No pleural fluid. Airway is unremarkable.  Upper Abdomen: Liver margin is irregular. Stones are seen in the gallbladder. Visualized portions of the adrenal glands and kidneys are unremarkable. Spleen appears enlarged but is incompletely imaged. Visualized portions of the pancreas, stomach and bowel  are grossly unremarkable. Porta hepatis lymph nodes are borderline in size.  Musculoskeletal: No worrisome lytic or sclerotic lesions.  IMPRESSION: 1. No evidence of recurrent or metastatic disease. 2.  Aortic atherosclerosis (ICD10-170.0). 3. Cirrhosis with suspected splenomegaly. 4. Cholelithiasis.   Electronically Signed   By: Lorin Picket M.D.   On: 03/05/2018 17:49   Impression:  There is no evidence of recurrent or metastatic disease in the chest.  There is a subcentimeter low-attenuation nodule in the right thyroid which is unchanged from her prior CT scan.  She has a long history of multinodular goiter.  I reviewed the CT scan images with her and answered her questions.  Plan:  I will plan to see her back in 1 year with a low-dose screening chest CT.  I spent 15 minutes performing this established patient evaluation and > 50% of this time was spent face to face counseling and coordinating the surveillance of this patient's prior carcinoid tumor of the lung.    Gaye Pollack, MD [facility] [phone]

## 2018-04-08 ENCOUNTER — Other Ambulatory Visit: Payer: Self-pay | Admitting: Family Medicine

## 2018-04-09 NOTE — Telephone Encounter (Signed)
Database ran on 02/03/18 and is in media for review  Last written: 03/05/18 Last ov: 03/17/18 Next ov: 04/10/18 Contract: will get at 8/15 visit UDS: will get at 04/10/18

## 2018-04-10 ENCOUNTER — Other Ambulatory Visit: Payer: Self-pay | Admitting: Family Medicine

## 2018-04-10 ENCOUNTER — Encounter: Payer: Self-pay | Admitting: Family Medicine

## 2018-04-10 ENCOUNTER — Ambulatory Visit (INDEPENDENT_AMBULATORY_CARE_PROVIDER_SITE_OTHER): Payer: BLUE CROSS/BLUE SHIELD | Admitting: Family Medicine

## 2018-04-10 VITALS — BP 137/75 | HR 72 | Temp 98.6°F | Resp 16 | Ht 70.0 in | Wt 275.0 lb

## 2018-04-10 DIAGNOSIS — E1151 Type 2 diabetes mellitus with diabetic peripheral angiopathy without gangrene: Secondary | ICD-10-CM

## 2018-04-10 DIAGNOSIS — IMO0002 Reserved for concepts with insufficient information to code with codable children: Secondary | ICD-10-CM

## 2018-04-10 DIAGNOSIS — E1165 Type 2 diabetes mellitus with hyperglycemia: Secondary | ICD-10-CM | POA: Diagnosis not present

## 2018-04-10 DIAGNOSIS — E1169 Type 2 diabetes mellitus with other specified complication: Secondary | ICD-10-CM | POA: Insufficient documentation

## 2018-04-10 DIAGNOSIS — F419 Anxiety disorder, unspecified: Secondary | ICD-10-CM

## 2018-04-10 DIAGNOSIS — E785 Hyperlipidemia, unspecified: Secondary | ICD-10-CM

## 2018-04-10 DIAGNOSIS — J069 Acute upper respiratory infection, unspecified: Secondary | ICD-10-CM

## 2018-04-10 DIAGNOSIS — M7710 Lateral epicondylitis, unspecified elbow: Secondary | ICD-10-CM | POA: Insufficient documentation

## 2018-04-10 DIAGNOSIS — M7711 Lateral epicondylitis, right elbow: Secondary | ICD-10-CM

## 2018-04-10 DIAGNOSIS — F418 Other specified anxiety disorders: Secondary | ICD-10-CM

## 2018-04-10 DIAGNOSIS — N390 Urinary tract infection, site not specified: Secondary | ICD-10-CM | POA: Diagnosis not present

## 2018-04-10 DIAGNOSIS — R8281 Pyuria: Secondary | ICD-10-CM

## 2018-04-10 HISTORY — DX: Acute upper respiratory infection, unspecified: J06.9

## 2018-04-10 HISTORY — DX: Hyperlipidemia, unspecified: E78.5

## 2018-04-10 HISTORY — DX: Type 2 diabetes mellitus with diabetic peripheral angiopathy without gangrene: E11.51

## 2018-04-10 HISTORY — DX: Other specified anxiety disorders: F41.8

## 2018-04-10 HISTORY — DX: Lateral epicondylitis, unspecified elbow: M77.10

## 2018-04-10 HISTORY — DX: Reserved for concepts with insufficient information to code with codable children: IMO0002

## 2018-04-10 LAB — POC URINALSYSI DIPSTICK (AUTOMATED)
Bilirubin, UA: NEGATIVE
Blood, UA: NEGATIVE
Glucose, UA: NEGATIVE
Ketones, UA: NEGATIVE
Nitrite, UA: NEGATIVE
PH UA: 6.5 (ref 5.0–8.0)
PROTEIN UA: NEGATIVE
Spec Grav, UA: 1.015 (ref 1.010–1.025)
UROBILINOGEN UA: 0.2 U/dL

## 2018-04-10 MED ORDER — LORAZEPAM 0.5 MG PO TABS: 0.5000 mg | ORAL_TABLET | Freq: Two times a day (BID) | ORAL | 1 refills | Status: DC | PRN

## 2018-04-10 MED ORDER — FLUTICASONE PROPIONATE 50 MCG/ACT NA SUSP
2.0000 | Freq: Every day | NASAL | 6 refills | Status: DC
Start: 2018-04-10 — End: 2019-10-27

## 2018-04-10 NOTE — Assessment & Plan Note (Signed)
Tennis elbow strap nsaid s , ice, rest  Sport med prn

## 2018-04-10 NOTE — Assessment & Plan Note (Signed)
Antihistamine and flonase  Call if symptoms persist or worsen

## 2018-04-10 NOTE — Assessment & Plan Note (Signed)
Cont ativan and wellbutrin She did not want to change the dose or go to counselor Gave her info for [facility] for counseling ---- due to payment scale

## 2018-04-10 NOTE — Assessment & Plan Note (Signed)
Per endo °

## 2018-04-10 NOTE — Progress Notes (Signed)
Patient ID: Kimberly [name], female    DOB: 07-23-1961  Age: 57 y.o. MRN: [id number]    Subjective:  Subjective  HPI Kimberly [name] presents for f/u depression / anxiety. She does not want to go for counseling. She also c/o R elbow pain--- laterally and hurts to lift things.     Pt also c/o start of a sinus infection. Clear mucouc  Review of Systems  Constitutional: Negative for appetite change, diaphoresis, fatigue, fever and unexpected weight change.  HENT: Positive for congestion, rhinorrhea, sinus pressure and sinus pain.   Eyes: Negative for pain, redness and visual disturbance.  Respiratory: Negative for cough, chest tightness, shortness of breath and wheezing.   Cardiovascular: Negative for chest pain, palpitations and leg swelling.  Gastrointestinal: Negative for abdominal pain, blood in stool and nausea.  Endocrine: Negative for cold intolerance, heat intolerance, polydipsia, polyphagia and polyuria.  Genitourinary: Negative for difficulty urinating, dysuria and frequency.  Skin: Negative for rash.  Allergic/Immunologic: Negative for environmental allergies.  Neurological: Negative for dizziness, light-headedness, numbness and headaches.  Psychiatric/Behavioral: Positive for decreased concentration, dysphoric mood and sleep disturbance. The patient is nervous/anxious.     History Past Medical History:  Diagnosis Date  . Anxiety    takes Ativan as needed  . Bronchitis    hx of;last time > 5yrago  . Cancer ([facility]    part of left lower lung removed- lung ca 2013  . Colitis   . Colon polyps    hx of  . Complication of anesthesia    gas and MAC procedures  . Depression    takes Wellbutrin daily  . Diabetes mellitus    Novolog and Levemir daily  . [place]   . Eczema   . GERD (gastroesophageal reflux disease)    takes Protonix nightly  . Headache(784.0)    stress HA frequently  . IBS (irritable bowel syndrome)   . Lung mass    left  upper lobe  . Pneumonia    walking pneumonia in 2007  . PONV (postoperative nausea and vomiting)   . Shortness of breath    certain times of the year  . Stress incontinence     She has a past surgical history that includes CAESAREAN SECTION (96/98/2000); Rectal fistula (1993); Uterine fibroid surgery; colonosocpy; Biopsy thyroid; and Thoracotomy (10/26/2011).   Her family history includes Anesthesia problems in her sister; Breast cancer in her maternal aunt and other; Cancer in her father, maternal grandfather, and mother; Diabetes in her mother; Heart disease in her maternal grandfather and maternal grandmother; Hyperlipidemia in her maternal grandfather and mother; Migraines in her sister.She reports that she quit smoking about 35 years ago. Her smoking use included cigarettes. She has never used smokeless tobacco. She reports that she does not drink alcohol or use drugs.  Current Outpatient Medications on File Prior to Visit  Medication Sig Dispense Refill  . ACCU-CHEK FASTCLIX LANCETS MISC   0  . ACCU-CHEK SMARTVIEW test strip   1  . albuterol (PROVENTIL) (2.5 MG/3ML) 0.083% nebulizer solution Take 2.5 mg by nebulization every 6 (six) hours as needed. For shortness of breath    . buPROPion (WELLBUTRIN XL) 150 MG 24 hr tablet Take 1 tablet (150 mg total) by mouth every evening. 90 tablet 1  . ibuprofen (ADVIL,MOTRIN) 200 MG tablet Take 200 mg by mouth every 6 (six) hours as needed for moderate pain.    .Marland Kitcheninsulin aspart (NOVOLOG FLEXPEN) 100 UNIT/ML injection Inject  25 Units into the skin 3 (three) times daily before meals.     . Insulin Glargine (TOUJEO MAX SOLOSTAR) 300 UNIT/ML SOPN Inject 52 Units into the skin 2 (two) times daily. 6 pen 11  . insulin regular human CONCENTRATED (HUMULIN R U-500 KWIKPEN) 500 UNIT/ML kwikpen Inject 40-60 units 3x a day under skin 4 pen 3  . metFORMIN (GLUMETZA) 500 MG (MOD) 24 hr tablet Take 500-1,000 mg by mouth See admin instructions. Pt takes 1053m in the  morning and 5021min the evening    . pantoprazole (PROTONIX) 40 MG tablet Take 1 tablet (40 mg total) by mouth 2 (two) times daily. 60 tablet 5  . PROAIR HFA 108 (90 BASE) MCG/ACT inhaler Inhale 2 puffs into the lungs every 6 (six) hours as needed. For shortness of breath 1 Inhaler 6  . sucralfate (CARAFATE) 1 G tablet Take 1 tablet (1 g total) by mouth 4 (four) times daily. (Patient taking differently: Take 1 g by mouth 4 (four) times daily as needed. ) 60 tablet 0   No current facility-administered medications on file prior to visit.      Objective:  Objective  Physical Exam  Constitutional: She is oriented to person, place, and time. She appears well-developed and well-nourished.  HENT:  Head: Normocephalic and atraumatic.  Eyes: Conjunctivae and EOM are normal.  Neck: Normal range of motion. Neck supple. No JVD present. Carotid bruit is not present. No thyromegaly present.  Cardiovascular: Normal rate, regular rhythm and normal heart sounds.  No murmur heard. Pulmonary/Chest: Effort normal and breath sounds normal. No respiratory distress. She has no wheezes. She has no rales. She exhibits no tenderness.  Musculoskeletal: She exhibits tenderness. She exhibits no edema.       Right forearm: She exhibits tenderness. She exhibits no swelling, no edema and no deformity.       Arms: Neurological: She is alert and oriented to person, place, and time.  Psychiatric: Her speech is normal and behavior is normal. Thought content normal. Her mood appears anxious. Cognition and memory are normal. She exhibits a depressed mood.  Nursing note and vitals reviewed.  BP 137/75 (BP Location: Right Arm, Cuff Size: Large)   Pulse 72   Temp 98.6 F (37 C) (Oral)   Resp 16   Ht _0  (1.778 m)   Wt 275 lb (124.7 kg)   SpO2 100%   BMI 39.46 kg/m  Wt Readings from Last 3 Encounters:  04/10/18 275 lb (124.7 kg)  03/26/18 275 lb (124.7 kg)  03/17/18 275 lb 2 oz (124.8 kg)     Lab Results    Component Value Date   WBC 4.0 01/18/2015   HGB 12.8 01/18/2015   HCT 38.4 01/18/2015   PLT 137 (L) 01/18/2015   GLUCOSE 316 (H) 12/05/2017   ALT 22 12/05/2017   AST 25 12/05/2017   NA 136 12/05/2017   K 4.7 12/05/2017   CL 99 12/05/2017   CREATININE 0.82 12/05/2017   BUN 13 12/05/2017   CO2 26 12/05/2017   TSH 0.56 03/05/2008   INR 1.04 10/24/2011   HGBA1C 9.0 (A) 03/13/2018    Ct Chest Wo Contrast  Result Date: 03/05/2018 CLINICAL DATA:  Left lung cancer. EXAM: CT CHEST WITHOUT CONTRAST TECHNIQUE: Multidetector CT imaging of the chest was performed following the standard protocol without IV contrast. COMPARISON:  02/20/2017. FINDINGS: Cardiovascular: Atherosclerotic calcification of the arterial vasculature. Heart size normal. No pericardial effusion. Mediastinum/Nodes: Subcentimeter low-attenuation lesion in the right lobe  of the thyroid. Mediastinal lymph nodes are not enlarged by CT size criteria. Hilar regions are difficult to evaluate without IV contrast. No axillary adenopathy. Esophagus is grossly unremarkable. Lungs/Pleura: Postoperative scarring in the left lower lobe. Lungs are otherwise clear. No pleural fluid. Airway is unremarkable. Upper Abdomen: Liver margin is irregular. Stones are seen in the gallbladder. Visualized portions of the adrenal glands and kidneys are unremarkable. Spleen appears enlarged but is incompletely imaged. Visualized portions of the pancreas, stomach and bowel are grossly unremarkable. Porta hepatis lymph nodes are borderline in size. Musculoskeletal: No worrisome lytic or sclerotic lesions. IMPRESSION: 1. No evidence of recurrent or metastatic disease. 2.  Aortic atherosclerosis (ICD10-170.0). 3. Cirrhosis with suspected splenomegaly. 4. Cholelithiasis. Electronically Signed   By: Lorin Picket M.D.   On: 03/05/2018 17:49     Assessment & Plan:  Plan  I am having Kimberly [name] start on fluticasone. I am also having her maintain her insulin  aspart, albuterol, sucralfate, metFORMIN, ibuprofen, PROAIR HFA, ACCU-CHEK SMARTVIEW, ACCU-CHEK FASTCLIX LANCETS, Insulin Glargine, buPROPion, pantoprazole, insulin regular human CONCENTRATED, and LORazepam.  Meds ordered this encounter  Medications  . fluticasone (FLONASE) 50 MCG/ACT nasal spray    Sig: Place 2 sprays into both nostrils daily.    Dispense:  16 g    Refill:  6    Problem List Items Addressed This Visit      Unprioritized   Depression with anxiety    Cont ativan and wellbutrin She did not want to change the dose or go to counselor G[facility] her info for [facility] ---- due to payment scale      DM (diabetes mellitus) type II uncontrolled, periph vascular disorder ([place])    Per endo      Relevant Orders   Microalbumin / creatinine urine ratio   Comprehensive metabolic panel   Lipid panel   Hyperlipidemia LDL goal <70   Relevant Orders   Microalbumin / creatinine urine ratio   Comprehensive metabolic panel   Lipid panel   Situational anxiety - Primary   Relevant Orders   Pain Mgmt, Profile 8 w/Conf, U   Tennis elbow    Tennis elbow strap nsaid s , ice, rest  Sport med prn      Viral upper respiratory tract infection    Antihistamine and flonase  Call if symptoms persist or worsen      Relevant Medications   fluticasone (FLONASE) 50 MCG/ACT nasal spray    Other Visit Diagnoses    Pyuria       Relevant Orders   POCT Urinalysis Dipstick (Automated) (Completed)   Urine Culture      Follow-up: Return in about 6 months (around 10/11/2018), or if symptoms worsen or fail to improve, for diabetes II, hypertension, hyperlipidemia.  [name], DO

## 2018-04-10 NOTE — Patient Instructions (Signed)
Persistent Depressive Disorder Persistent depressive disorder (PDD) is a mental health condition. PDD causes symptoms of low-level depression for 2 years or longer. It may also be called long-term (chronic) depression or dysthymia. PDD may include episodes of more severe depression that last for about 2 weeks (major depressive disorder or MDD). PDD can affect the way you think, feel, and sleep. This condition may also affect your relationships. You may be more likely to get sick if you have PDD. Symptoms of PDD occur for most of the day and may include:  Feeling tired (fatigue).  Low energy.  Eating too much or too little.  Sleeping too much or too little.  Feeling restless or agitated.  Feeling hopeless.  Feeling worthless or guilty.  Feeling worried or nervous (anxiety).  Trouble concentrating or making decisions.  Low self-esteem.  A negative way of looking at things ([place]).  Not being able to have fun or feel pleasure.  Avoiding interacting with people.  Getting angry or annoyed easily (irritability).  Acting aggressive or angry.  Follow these instructions at home: Activity  Go back to your normal activities as told by your doctor.  Exercise regularly as told by your doctor. General instructions  Take over-the-counter and prescription medicines only as told by your doctor.  Do not drink alcohol. Or, limit how much alcohol you drink to no more than 1 drink a day for nonpregnant women and 2 drinks a day for men. One drink equals 12 oz of beer, 5 oz of wine, or 1 oz of hard liquor. Alcohol can affect any antidepressant medicines you are taking. Talk with your doctor about your alcohol use.  Eat a healthy diet and get plenty of sleep.  Find activities that you enjoy each day.  Consider joining a support group. Your doctor may be able to suggest a support group.  Keep all follow-up visits as told by your doctor. This is important. Where to find more  information: [employer] on Mental Illness  www.nami.org  U.S. National Institute of Mental Health  https://carter.com/  National Suicide Prevention Lifeline  828 340 3326 825-856-9335). This is free, 24-hour help.  Contact a doctor if:  Your symptoms get worse.  You have new symptoms.  You have trouble sleeping or doing your daily activities. Get help right away if:  You self-harm.  You have serious thoughts about hurting yourself or others.  You see, hear, taste, smell, or feel things that are not there (hallucinate). This information is not intended to replace advice given to you by your health care provider. Make sure you discuss any questions you have with your health care provider. Document Released: 07/25/2015 Document Revised: 04/06/2016 Document Reviewed: 04/06/2016 Elsevier Interactive Patient Education  2017 [employer].

## 2018-04-11 LAB — MICROALBUMIN / CREATININE URINE RATIO
CREATININE, U: 86.9 mg/dL
MICROALB UR: 1.5 mg/dL (ref 0.0–1.9)
MICROALB/CREAT RATIO: 1.8 mg/g (ref 0.0–30.0)

## 2018-04-12 LAB — URINE CULTURE
MICRO NUMBER:: 90977107
SPECIMEN QUALITY:: ADEQUATE

## 2018-04-14 LAB — PAIN MGMT, PROFILE 8 W/CONF, U
6 ACETYLMORPHINE: NEGATIVE ng/mL (ref <10)
ALCOHOL METABOLITES: NEGATIVE ng/mL (ref <500)
AMINOCLONAZEPAM: NEGATIVE ng/mL (ref <25)
Alphahydroxyalprazolam: NEGATIVE ng/mL (ref <25)
Alphahydroxymidazolam: NEGATIVE ng/mL (ref <50)
Alphahydroxytriazolam: NEGATIVE ng/mL (ref <50)
Amphetamines: NEGATIVE ng/mL (ref <500)
BUPRENORPHINE, URINE: NEGATIVE ng/mL (ref <5)
Benzodiazepines: POSITIVE ng/mL — AB (ref <100)
Cocaine Metabolite: NEGATIVE ng/mL (ref <150)
Creatinine: 82.9 mg/dL
HYDROXYETHYLFLURAZEPAM: NEGATIVE ng/mL (ref <50)
LORAZEPAM: 546 ng/mL — AB (ref <50)
MDMA: NEGATIVE ng/mL (ref <500)
Marijuana Metabolite: NEGATIVE ng/mL (ref <20)
Nordiazepam: NEGATIVE ng/mL (ref <50)
Opiates: NEGATIVE ng/mL (ref <100)
Oxazepam: NEGATIVE ng/mL (ref <50)
Oxidant: NEGATIVE ug/mL (ref <200)
Oxycodone: NEGATIVE ng/mL (ref <100)
TEMAZEPAM: NEGATIVE ng/mL (ref <50)
pH: 6.97 (ref 4.5–9.0)

## 2018-04-21 ENCOUNTER — Telehealth: Payer: Self-pay | Admitting: Family Medicine

## 2018-04-21 MED ORDER — DOXYCYCLINE HYCLATE 100 MG PO TABS: 100.0000 mg | ORAL_TABLET | Freq: Two times a day (BID) | ORAL | 0 refills | Status: DC

## 2018-04-21 NOTE — Telephone Encounter (Signed)
Doxycycline 100 mg bid x 10 days

## 2018-04-21 NOTE — Telephone Encounter (Signed)
p pec Copied from [place] [phone]. Topic: Quick Communication - See Telephone Encounter >> Apr 21, 2018  1:49 PM Adelene Idler wrote: Pt called in and stated on 04/10/2018 she was in for an appt with Dr Etter Sjogren and advised her of the congestion and headache and sinus pressure she was having she is now requesting for an antibiotic or med to clear up the congestion and pressure.

## 2018-04-21 NOTE — Telephone Encounter (Signed)
Copied from [place] [id number]. Topic: Quick Communication - See Telephone Encounter >> Apr 21, 2018  1:49 PM Kimberly [name] wrote: Pt called in and stated on 04/10/2018 she was in for an appt with Dr Etter Sjogren and advised her of the congestion and headache and sinus pressure she was having she is now requesting for an antibiotic or med to clear up the congestion and pressure.

## 2018-04-21 NOTE — Telephone Encounter (Signed)
Notified pt and sent Rx.

## 2018-04-29 ENCOUNTER — Encounter: Payer: Self-pay | Admitting: Family Medicine

## 2018-05-19 ENCOUNTER — Other Ambulatory Visit: Payer: Self-pay | Admitting: Internal Medicine

## 2018-06-09 ENCOUNTER — Other Ambulatory Visit: Payer: Self-pay | Admitting: Family Medicine

## 2018-06-09 ENCOUNTER — Other Ambulatory Visit: Payer: Self-pay | Admitting: Internal Medicine

## 2018-06-09 DIAGNOSIS — F419 Anxiety disorder, unspecified: Secondary | ICD-10-CM

## 2018-06-09 NOTE — Telephone Encounter (Signed)
Last office visit on 04/10/2018 Last refill on 04/10/2018  #60 with 1 refill Last CSC on 04/10/2018 UDS on 04/10/2018---low risk

## 2018-07-04 ENCOUNTER — Other Ambulatory Visit: Payer: Self-pay | Admitting: Family Medicine

## 2018-07-07 NOTE — Telephone Encounter (Signed)
Database ran and is on your desk for review.  Last filled per database: 06/10/18 Last written: 06/09/18 Last ov: 04/10/18 Next ov: none Contract: 04/11/19 UDS: 04/11/19

## 2018-07-09 ENCOUNTER — Other Ambulatory Visit: Payer: Self-pay | Admitting: Family Medicine

## 2018-07-10 ENCOUNTER — Encounter: Payer: Self-pay | Admitting: Family Medicine

## 2018-07-10 ENCOUNTER — Ambulatory Visit: Payer: Self-pay | Admitting: Internal Medicine

## 2018-07-10 ENCOUNTER — Ambulatory Visit (INDEPENDENT_AMBULATORY_CARE_PROVIDER_SITE_OTHER): Payer: BLUE CROSS/BLUE SHIELD | Admitting: Family Medicine

## 2018-07-10 ENCOUNTER — Ambulatory Visit: Payer: Self-pay

## 2018-07-10 VITALS — BP 148/90 | HR 90 | Temp 98.2°F | Ht 70.0 in | Wt 279.1 lb

## 2018-07-10 DIAGNOSIS — J0140 Acute pansinusitis, unspecified: Secondary | ICD-10-CM | POA: Diagnosis not present

## 2018-07-10 DIAGNOSIS — R110 Nausea: Secondary | ICD-10-CM

## 2018-07-10 MED ORDER — AZITHROMYCIN 250 MG PO TABS: ORAL_TABLET | ORAL | 0 refills | Status: DC

## 2018-07-10 MED ORDER — ONDANSETRON HCL 4 MG PO TABS: 4.0000 mg | ORAL_TABLET | Freq: Three times a day (TID) | ORAL | 0 refills | Status: DC | PRN

## 2018-07-10 MED ORDER — ALBUTEROL SULFATE (2.5 MG/3ML) 0.083% IN NEBU: 2.5000 mg | INHALATION_SOLUTION | Freq: Four times a day (QID) | RESPIRATORY_TRACT | 0 refills | Status: DC | PRN

## 2018-07-10 NOTE — Telephone Encounter (Signed)
Pt c/o flu symptoms that started on Sunday. Pt stated she had body aches and chills and a fever earlier in the week but denies those symptoms today. Pt c/o chest congestion and tightness. Pt has asthma and has been using Albuterol nebulizer, but feels it is not relieving the tightness. Pt stated she is having coughing "spasms" that stop after using the Albuterol. Pt stated her son had the flu last week. 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 Pt c/o shortness of breath at rest and walking. Pt having runny nose, chest congestion, sinus pressure and stated she is having blood and mucous when she blows her nose. Pt did not get her flu shot this year. Pt stated a doctor told her not to get flu shots. Pt has tried taking Mucinex, Advil cold and sinus and Tussin. Pt stated it feels like there is congestion but she cannot get it up.  Care advice given and pt verbalized understanding. No available appointments today at PCP office ([place] at [facility]). Pt accepted appointment at [facility] will be seeing Almira Coaster NP today at 11:00.  Reason for Disposition . [1] Fever returns after gone for over 24 hours AND [2] symptoms worse or not improved  Additional Information . Commented on: Earache    Pt has been using nasal washing and having sinus congestion and face pain.  Answer Assessment - Initial Assessment Questions 1. WORST SYMPTOM: "What is your worst symptom?" (e.g., cough, runny nose, muscle aches, headache, sore throat, fever)      Dry cough and chest tightness, runny nose and stopped up nose that feels swollen, muscle aches, headache, hoarse 2. ONSET: "When did your flu symptoms start?"     Sunday 3. COUGH: "How bad is the cough?"       Having couging spasms with talking then she takes a breathing treatment after she takes a breathing treatment it calms down 4. RESPIRATORY DISTRESS: "Describe your breathing."      Shortness of breath at rest and  walking 5. FEVER: "Do you have a fever?" If so, ask: "What is your temperature, how was it measured, and when did it start?"     No- but had one earlier in the week 6. EXPOSURE: "Were you exposed to someone with influenza?"       Son  7. FLU VACCINE: "Did you get a flu shot this year?"     no 8. HIGH RISK DISEASE: "Do you any chronic medical problems?" (e.g., heart or lung disease, asthma, weak immune system, or other HIGH RISK conditions)     Asthma and missing part of lung had left lower lung removed. 9. PREGNANCY: "Is there any chance you are pregnant?" "When was your last menstrual period?"  n/a 10. OTHER SYMPTOMS: "Do you have any other symptoms?"  (e.g., runny nose, muscle aches, headache, sore throat)      Runny nose, chest congestion and sinus pressure and blowing nose with blood and mucous  Protocols used: INFLUENZA - SEASONAL-A-AH

## 2018-07-10 NOTE — Progress Notes (Signed)
Patient ID: Kimberly [name], female   DOB: 1961/06/16, 57 y.o.   MRN: [id number]  PCP: Carollee Herter, Alferd Apa, DO  Subjective:  Kimberly [name] is a 57 y.o. year old very pleasant female patient who presents with symptoms including nasal congestion with green mucous, post nasal drip, cough that is productive of green sputum, and sinus pressure.  She is experiencing some mild nausea without vomiting when she feels excessive mucous that she is swallowing. She reports that this has made her not feel like eating.  She reports experiencing mild SOB that was relieved with nebulizer this am. No SOB at this time.  -started:6 days ago , symptoms are not improving.  -previous treatments: No OTC medications. She reports using her albuterol nebulizer which she states she uses on a rare basis when she does not feel well. She reports this is used for when she experiences SOB due to prior history of thoracotomy. She has an albuterol inhaler but does not report using it and states that nebulizer provides benefit. -sick contacts/travel/risks: denies flu exposure. Recent sick contact exposure with a son living at home.  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  No recent antibiotic use. Former smoker quit 1984 per patient   ROS-denies fever, SOB presently, NVD, tooth pain  Pertinent Past Medical History- T2DM, HTN  Medications- reviewed  Current Outpatient Medications  Medication Sig Dispense Refill  . ACCU-CHEK FASTCLIX LANCETS MISC   0  . ACCU-CHEK SMARTVIEW test strip TEST FOUR TO FIVE TIMES A DAY 150 each 0  . albuterol (PROVENTIL) (2.5 MG/3ML) 0.083% nebulizer solution Take 2.5 mg by nebulization every 6 (six) hours as needed. For shortness of breath    . buPROPion (WELLBUTRIN XL) 150 MG 24 hr tablet Take 1 tablet (150 mg total) by mouth every evening. 90 tablet 1  . doxycycline (VIBRA-TABS) 100 MG tablet Take 1 tablet (100 mg total) by mouth 2 (two) times daily. 20 tablet 0   . fluticasone (FLONASE) 50 MCG/ACT nasal spray Place 2 sprays into both nostrils daily. 16 g 6  . ibuprofen (ADVIL,MOTRIN) 200 MG tablet Take 200 mg by mouth every 6 (six) hours as needed for moderate pain.    Marland Kitchen insulin aspart (NOVOLOG FLEXPEN) 100 UNIT/ML injection Inject 25 Units into the skin 3 (three) times daily before meals.     . Insulin Glargine (TOUJEO MAX SOLOSTAR) 300 UNIT/ML SOPN Inject 52 Units into the skin 2 (two) times daily. 6 pen 11  . insulin regular human CONCENTRATED (HUMULIN R U-500 KWIKPEN) 500 UNIT/ML kwikpen Inject 40-60 units 3x a day under skin 4 pen 3  . LORazepam (ATIVAN) 0.5 MG tablet TAKE 1 TABLET(0.5 MG) BY MOUTH TWICE DAILY AS NEEDED FOR ANXIETY 60 tablet 0  . LORazepam (ATIVAN) 0.5 MG tablet TAKE 1 TABLET BY MOUTH TWICE DAILY AS NEEDED FOR ANXIETY 60 tablet 0  . metFORMIN (GLUMETZA) 500 MG (MOD) 24 hr tablet Take 500-1,000 mg by mouth See admin instructions. Pt takes 1028m in the morning and 5055min the evening    . pantoprazole (PROTONIX) 40 MG tablet TAKE 1 TABLET(40 MG) BY MOUTH TWICE DAILY 180 tablet 1  . PROAIR HFA 108 (90 BASE) MCG/ACT inhaler Inhale 2 puffs into the lungs every 6 (six) hours as needed. For shortness of breath 1 Inhaler 6  . sucralfate (CARAFATE) 1 G tablet Take 1 tablet (1 g total) by mouth 4 (four) times daily. (Patient taking differently: Take 1 g by mouth  4 (four) times daily as needed. ) 60 tablet 0  . metFORMIN (GLUCOPHAGE-XR) 500 MG 24 hr tablet TK 2 TS PO BID  0  . ondansetron (ZOFRAN-ODT) 4 MG disintegrating tablet ondansetron 4 mg disintegrating tablet  dissolve 1 tablet ON TONGUE every 8 hours     No current facility-administered medications for this visit.     Objective: BP (!) 148/90 (BP Location: Left Arm, Patient Position: Sitting, Cuff Size: Large)   Pulse 90   Temp 98.2 F (36.8 C) (Oral)   Ht 5' 10"  (1.778 m)   Wt 279 lb 1.9 oz (126.6 kg)   SpO2 98%   BMI 40.05 kg/m  Gen: NAD, resting comfortably HEENT:  Turbinates mildly erythematous, TMs normal bilaterally, oropharynx without erythema, exudate or edema, post nasal drip present, + frontal and maxillary sinus tenderness CV: RRR no murmurs rubs or gallops Lungs: CTAB no crackles, wheeze, rhonchi Abdomen: soft/nontender/nondistended/normal bowel sounds. No rebound or guarding.  Ext: no edema Skin: warm, dry, no rash Neuro: grossly normal, moves all extremities  Assessment/Plan: 1. Acute pansinusitis, recurrence not specified Exam and history are most consistent with sinusitis. Symptoms are worsening and we discussed supportive measures and treatment options. Provided antibiotic due to worsening symptoms. Allergy to PCN, discussed options for treatment and with the episode of SOB also, opted to provide azithromycin. She declined doxycycline. She states that she has received benefit with azithromycin previously. Advised patient on supportive measures:  Get rest, drink plenty of fluids, and follow up if fever >101, if symptoms worsen or if symptoms are not improved in 3-4 days. Patient  verbalizes understanding and agrees with plan.  She requested a refill for nebulizer and we discussed that this medication is a rescue medication and should not be used on a regular basis. She stated that she rarely used this medication and preferred this method over the inhaler. Refill provided with instruction to follow up with PCP for future refills and evaluation of this medication if symptoms persist. She voiced understanding and agreed with plan.   - azithromycin (ZITHROMAX) 250 MG tablet; Take 2 tablets by mouth at once today, then one tablet daily x 4 days.  Dispense: 6 tablet; Refill: 0 - albuterol (PROVENTIL) (2.5 MG/3ML) 0.083% nebulizer solution; Take 3 mLs (2.5 mg total) by nebulization every 6 (six) hours as needed. For shortness of breath  Dispense: 75 mL; Refill: 0  2. Nausea Mild nausea without vomiting present. Provided small number of ondansetron as  patient requested due to limited food intake with nausea that is most consistent with excessive drainage. Discussed that this is also for short term use and she should follow up with PCP if symptoms do not improve, worsen, or new symptoms develop. She voiced understanding and agreed with plan.   - ondansetron (ZOFRAN) 4 MG tablet; Take 1 tablet (4 mg total) by mouth every 8 (eight) hours as needed for nausea or vomiting.  Dispense: 9 tablet; Refill: 0  Work note provided that she was seen today.  Finally, we reviewed reasons to return to care including if symptoms worsen or persist or new concerns arise- once again particularly shortness of breath or fever.    Laurita Quint, FNP

## 2018-07-10 NOTE — Patient Instructions (Signed)
Please take medication with food as directed.   Please drink plenty of water so that your urine is pale yellow or clear. Also, get plenty of rest and follow up if symptoms do not improve in 3 to 4 days, worsen, or you develop a fever >101.  Please use nebulizer on a short term basis as needed only as discussed. This should not be used with inhaler as it is the same medication.   Sinusitis, Adult Sinusitis is soreness and inflammation of your sinuses. Sinuses are hollow spaces in the bones around your face. They are located:  Around your eyes.  In the middle of your forehead.  Behind your nose.  In your cheekbones.  Your sinuses and nasal passages are lined with a stringy fluid (mucus). Mucus normally drains out of your sinuses. When your nasal tissues get inflamed or swollen, the mucus can get trapped or blocked so air cannot flow through your sinuses. This lets bacteria, viruses, and funguses grow, and that leads to infection. Follow these instructions at home: Medicines  Take, use, or apply over-the-counter and prescription medicines only as told by your doctor. These may include nasal sprays.  If you were prescribed an antibiotic medicine, take it as told by your doctor. Do not stop taking the antibiotic even if you start to feel better. Hydrate and Humidify  Drink enough water to keep your pee (urine) clear or pale yellow.  Use a cool mist humidifier to keep the humidity level in your home above 50%.  Breathe in steam for 10-15 minutes, 3-4 times a day or as told by your doctor. You can do this in the bathroom while a hot shower is running.  Try not to spend time in cool or dry air. Rest  Rest as much as possible.  Sleep with your head raised (elevated).  Make sure to get enough sleep each night. General instructions  Put a warm, moist washcloth on your face 3-4 times a day or as told by your doctor. This will help with discomfort.  Wash your hands often with soap and  water. If there is no soap and water, use hand sanitizer.  Do not smoke. Avoid being around people who are smoking (secondhand smoke).  Keep all follow-up visits as told by your doctor. This is important. Contact a doctor if:  You have a fever.  Your symptoms get worse.  Your symptoms do not get better within 10 days. Get help right away if:  You have a very bad headache.  You cannot stop throwing up (vomiting).  You have pain or swelling around your face or eyes.  You have trouble seeing.  You feel confused.  Your neck is stiff.  You have trouble breathing. This information is not intended to replace advice given to you by your health care provider. Make sure you discuss any questions you have with your health care provider. Document Released: 01/30/2008 Document Revised: 04/08/2016 Document Reviewed: 06/08/2015 Elsevier Interactive Patient Education  [employer].

## 2018-08-03 ENCOUNTER — Other Ambulatory Visit: Payer: Self-pay | Admitting: Internal Medicine

## 2018-08-07 ENCOUNTER — Ambulatory Visit: Payer: Self-pay

## 2018-08-07 ENCOUNTER — Encounter: Payer: Self-pay | Admitting: Medical

## 2018-08-07 ENCOUNTER — Ambulatory Visit: Payer: BLUE CROSS/BLUE SHIELD | Admitting: Medical

## 2018-08-07 ENCOUNTER — Ambulatory Visit (HOSPITAL_BASED_OUTPATIENT_CLINIC_OR_DEPARTMENT_OTHER)
Admission: RE | Admit: 2018-08-07 | Discharge: 2018-08-07 | Disposition: A | Discharge status: Home / Self Care | Payer: BLUE CROSS/BLUE SHIELD | Source: Ambulatory Visit | Attending: Medical | Admitting: Medical

## 2018-08-07 VITALS — BP 156/75 | HR 89 | Temp 98.1°F | Resp 16 | Ht 70.0 in | Wt 277.4 lb

## 2018-08-07 DIAGNOSIS — J0140 Acute pansinusitis, unspecified: Secondary | ICD-10-CM | POA: Diagnosis not present

## 2018-08-07 DIAGNOSIS — R05 Cough: Secondary | ICD-10-CM

## 2018-08-07 DIAGNOSIS — R059 Cough, unspecified: Secondary | ICD-10-CM

## 2018-08-07 DIAGNOSIS — J3489 Other specified disorders of nose and nasal sinuses: Secondary | ICD-10-CM | POA: Diagnosis not present

## 2018-08-07 DIAGNOSIS — J40 Bronchitis, not specified as acute or chronic: Secondary | ICD-10-CM

## 2018-08-07 MED ORDER — BUDESONIDE-FORMOTEROL FUMARATE 80-4.5 MCG/ACT IN AERO: 2.0000 | INHALATION_SPRAY | Freq: Two times a day (BID) | RESPIRATORY_TRACT | 3 refills | Status: DC

## 2018-08-07 MED ORDER — HYDROCODONE-HOMATROPINE 5-1.5 MG/5ML PO SYRP: 5.0000 mL | ORAL_SOLUTION | Freq: Four times a day (QID) | ORAL | 0 refills | Status: DC | PRN

## 2018-08-07 MED ORDER — DOXYCYCLINE HYCLATE 100 MG PO TABS: 100.0000 mg | ORAL_TABLET | Freq: Two times a day (BID) | ORAL | 0 refills | Status: DC

## 2018-08-07 MED ORDER — ALBUTEROL SULFATE (2.5 MG/3ML) 0.083% IN NEBU: 2.5000 mg | INHALATION_SOLUTION | Freq: Four times a day (QID) | RESPIRATORY_TRACT | 1 refills | Status: DC | PRN

## 2018-08-07 NOTE — Patient Instructions (Addendum)
You had cough for 1 month with some residual sinus pressure symptoms.  Some intermittent productive cough as well.  You may have persistent bronchitis with residual sinus infection.  Also your severe cough  improve response with albuterol  so this causes concern for reactive airway disease.  Also history of remote smoking years ago.  I prescribed you doxycycline antibiotic and refilled your albuterol neb solution.  Also sent in prescription for Symbicort inhaler.  Please start Symbicort.  For severe cough, I prescribed Hycodan.  You reported prior use of narcotics post surgery and did not have reactions.  Some nausea and vomiting with codeine but this is does not mean you will have reaction to Hycodan.  Try just use Hycodan at night to help you sleep.  Please get chest x-ray today.  Follow-up in 7 to 10 days or as needed.  If symptoms persist despite above measures then will consider referring you to pulmonologist.

## 2018-08-07 NOTE — Telephone Encounter (Signed)
Please advise on availability today

## 2018-08-07 NOTE — Telephone Encounter (Signed)
Pt called stating that she has been coughing since being sick about 2 weeks before Thanksgiving.  She states that she was treated with a Zpac and all symptoms but the cough has improved.  She states that cold air and cold drinks cause severe spasm and cough.  She has a Hx of lobectomy.  She states that she some times at night has pain to her chest which is cleared when she coughs. Pt denies fever. Pt is at work today. Call placed to [facility] for help with scheduling appointment and I lost call with patient. I attempted to redial patient. Left VM to return call to office for scheduling at another location. No care advice was discussed.  Reason for Disposition . [1] Continuous (nonstop) coughing AND [2] keeps from working or sleeping  Answer Assessment - Initial Assessment Questions 1. RESPIRATORY STATUS: "Describe your breathing?" (e.g., wheezing, shortness of breath, unable to speak, severe coughing)      SOB with cough 2. ONSET: "When did this breathing problem begin?"      2 weeks before thanksgiving 3. PATTERN "Does the difficult breathing come and go, or has it been constant since it started?"      Constant with cough 4. SEVERITY: "How bad is your breathing?" (e.g., mild, moderate, severe)    - MILD: No SOB at rest, mild SOB with walking, speaks normally in sentences, can lay down, no retractions, pulse < 100.    - MODERATE: SOB at rest, SOB with minimal exertion and prefers to sit, cannot lie down flat, speaks in phrases, mild retractions, audible wheezing, pulse 100-120.    - SEVERE: Very SOB at rest, speaks in single words, struggling to breathe, sitting hunched forward, retractions, pulse > 120      No when in cold  Air or cold drinks 5. RECURRENT SYMPTOM: "Have you had difficulty breathing before?" If so, ask: "When was the last time?" and "What happened that time?"      Yes continuing 6. CARDIAC HISTORY: "Do you have any history of heart disease?" (e.g., heart attack, angina, bypass  surgery, angioplasty)      no 7. LUNG HISTORY: "Do you have any history of lung disease?"  (e.g., pulmonary embolus, asthma, emphysema)     lobectomy 8. CAUSE: "What do you think is causing the breathing problem?"     Illness lingering 9. OTHER SYMPTOMS: "Do you have any other symptoms? (e.g., dizziness, runny nose, cough, chest pain, fever)     Cough  asmatic symptoms 10. PREGNANCY: "Is there any chance you are pregnant?" "When was your last menstrual period?"       N/A 11. TRAVEL: "Have you traveled out of the country in the last month?" (e.g., travel history, exposures)       no  Protocols used: BREATHING DIFFICULTY-A-AH

## 2018-08-07 NOTE — Telephone Encounter (Signed)
If she can come in at 4:20 and advise she may need to wait. Have staff check her 02 sat and pulse when she arrives and notify me. Otherwise can't see.

## 2018-08-07 NOTE — Telephone Encounter (Signed)
Author phoned pt. to offer 420PM appointment today per Percell Miller. Pt. Stated she would take it. Carmelina Noun, CMA made aware.

## 2018-08-07 NOTE — Progress Notes (Signed)
Subjective:    Patient ID: Kimberly [name], female    DOB: 06-20-1961, 57 y.o.   MRN: [id number]  HPI  Pt in for with recent chest congestion, cough and some sinus pressure over past month.   Pt states she was treated about one month ago with zpack for sinus infection. She states zpack did improve her symptoms but her cough never improved. Pt states her lungs feel tight and will occasionaly bring up mucus.  Most bothersome symptoms is constant daily cough.  Pt states she had lung surgery in 2013. Left lobectomy.   Pt in past had used hydrocodone with no reaction(post surgery).  Quit smoking in 1984(started as teen).  Pt notes that using neb treatment will stop cough.  She mentions that cough will be dry most of the day but then occasional bring up chunks of mucus.    Review of Systems  Constitutional: Negative for chills, fatigue and fever.  HENT: Positive for congestion and sinus pressure. Negative for sinus pain and sneezing.   Respiratory: Positive for cough. Negative for chest tightness, shortness of breath and wheezing.        Describes sensation of tight airways.  Cardiovascular: Negative for chest pain and palpitations.  Gastrointestinal: Negative for abdominal pain and blood in stool.  Genitourinary: Negative for dyspareunia and dysuria.  Musculoskeletal: Negative for back pain.       No lower extremity pain.  No reported calf swelling.  Neurological: Negative for dizziness and headaches.  Hematological: Negative for adenopathy. Does not bruise/bleed easily.  Psychiatric/Behavioral: Negative for behavioral problems and confusion.   Past Medical History:  Diagnosis Date  . Anxiety    takes Ativan as needed  . Bronchitis    hx of;last time > 75yrago  . Cancer ([facility]    part of left lower lung removed- lung ca 2013  . Colitis   . Colon polyps    hx of  . Complication of anesthesia    gas and MAC procedures  . Depression    takes Wellbutrin daily  . Diabetes  mellitus    Novolog and Levemir daily  . DM2 (diabetes mellitus, type 2) ([facility]   . Eczema   . GERD (gastroesophageal reflux disease)    takes Protonix nightly  . Headache(784.0)    stress HA frequently  . IBS (irritable bowel syndrome)   . Lung mass    left upper lobe  . Pneumonia    walking pneumonia in 2007  . PONV (postoperative nausea and vomiting)   . Shortness of breath    certain times of the year  . Stress incontinence      Social History   Socioeconomic History  . Marital status: Single    Spouse name: Not on file  . Number of children: 4  . Years of education: AAS  . Highest education level: Not on file  Occupational History  . Occupation: N/A  Social Needs  . Financial resource strain: Not on file  . Food insecurity:    Worry: Not on file    Inability: Not on file  . Transportation needs:    Medical: Not on file    Non-medical: Not on file  Tobacco Use  . Smoking status: Former Smoker    Types: Cigarettes    Last attempt to quit: 08/27/1982    Years since quitting: 35.9  . Smokeless tobacco: Never Used  Substance and Sexual Activity  . Alcohol use: No  . Drug use: No  .  Sexual activity: Not Currently    Birth control/protection: Post-menopausal  Lifestyle  . Physical activity:    Days per week: Not on file    Minutes per session: Not on file  . Stress: Not on file  Relationships  . Social connections:    Talks on phone: Not on file    Gets together: Not on file    Attends religious service: Not on file    Active member of club or organization: Not on file    Attends meetings of clubs or organizations: Not on file    Relationship status: Not on file  . Intimate partner violence:    Fear of current or ex partner: Not on file    Emotionally abused: Not on file    Physically abused: Not on file    Forced sexual activity: Not on file  Other Topics Concern  . Not on file  Social History Narrative   Lives at home w/ her sons   Right-handed    Caffeine: cup of coffee each morning      Does not regularly exercise.     Past Surgical History:  Procedure Laterality Date  . BIOPSY THYROID     pt has thyroid polyps another scan in Mar 2013  . CAESAREAN SECTION  96/98/2000  . colonosocpy    . Rectal fistula  1993  . THORACOTOMY  10/26/2011   Procedure: THORACOTOMY MAJOR;  Surgeon: Gaye Pollack, MD;  Location: [facility] OR;  Service: Thoracic;  Laterality: Left;  . UTERINE FIBROID SURGERY     late 30's early 58's    Family History  Problem Relation Age of Onset  . Hyperlipidemia Mother   . Diabetes Mother   . Cancer Mother        kidney   . Cancer Father        liver  . Breast cancer Maternal Aunt   . Heart disease Maternal Grandmother   . Cancer Maternal Grandfather        kidney   . Heart disease Maternal Grandfather   . Hyperlipidemia Maternal Grandfather   . Breast cancer Other   . Anesthesia problems Sister   . Migraines Sister   . Hypotension Neg Hx   . Malignant hyperthermia Neg Hx   . Pseudochol deficiency Neg Hx     Allergies  Allergen Reactions  . Codeine Nausea And Vomiting    migraines  . Diflucan [Fluconazole] Other (See Comments)    Blisters  . Metoclopramide Hcl Other (See Comments)    Do not give at all;muscle jerking  . Penicillins     REACTION: unspecified  . Prednisone Nausea And Vomiting    migraines    Current Outpatient Medications on File Prior to Visit  Medication Sig Dispense Refill  . ACCU-CHEK FASTCLIX LANCETS MISC   0  . ACCU-CHEK SMARTVIEW test strip TEST FOUR TO FIVE TIMES A DAY 150 each 3  . albuterol (PROVENTIL) (2.5 MG/3ML) 0.083% nebulizer solution Take 3 mLs (2.5 mg total) by nebulization every 6 (six) hours as needed. For shortness of breath 75 mL 0  . azithromycin (ZITHROMAX) 250 MG tablet Take 2 tablets by mouth at once today, then one tablet daily x 4 days. 6 tablet 0  . buPROPion (WELLBUTRIN XL) 150 MG 24 hr tablet TAKE 1 TABLET(150 MG) BY MOUTH EVERY EVENING 90 tablet 0    . doxycycline (VIBRA-TABS) 100 MG tablet Take 1 tablet (100 mg total) by mouth 2 (two) times daily. 20 tablet 0  .  fluticasone (FLONASE) 50 MCG/ACT nasal spray Place 2 sprays into both nostrils daily. 16 g 6  . ibuprofen (ADVIL,MOTRIN) 200 MG tablet Take 200 mg by mouth every 6 (six) hours as needed for moderate pain.    Marland Kitchen insulin aspart (NOVOLOG FLEXPEN) 100 UNIT/ML injection Inject 25 Units into the skin 3 (three) times daily before meals.     . Insulin Glargine (TOUJEO MAX SOLOSTAR) 300 UNIT/ML SOPN Inject 52 Units into the skin 2 (two) times daily. 6 pen 11  . insulin regular human CONCENTRATED (HUMULIN R U-500 KWIKPEN) 500 UNIT/ML kwikpen Inject 40-60 units 3x a day under skin 4 pen 3  . LORazepam (ATIVAN) 0.5 MG tablet TAKE 1 TABLET(0.5 MG) BY MOUTH TWICE DAILY AS NEEDED FOR ANXIETY 60 tablet 0  . LORazepam (ATIVAN) 0.5 MG tablet TAKE 1 TABLET BY MOUTH TWICE DAILY AS NEEDED FOR ANXIETY 60 tablet 0  . metFORMIN (GLUCOPHAGE-XR) 500 MG 24 hr tablet TK 2 TS PO BID  0  . metFORMIN (GLUMETZA) 500 MG (MOD) 24 hr tablet Take 500-1,000 mg by mouth See admin instructions. Pt takes 1087m in the morning and 5020min the evening    . ondansetron (ZOFRAN) 4 MG tablet Take 1 tablet (4 mg total) by mouth every 8 (eight) hours as needed for nausea or vomiting. 9 tablet 0  . ondansetron (ZOFRAN-ODT) 4 MG disintegrating tablet ondansetron 4 mg disintegrating tablet  dissolve 1 tablet ON TONGUE every 8 hours    . pantoprazole (PROTONIX) 40 MG tablet TAKE 1 TABLET(40 MG) BY MOUTH TWICE DAILY 180 tablet 1  . PROAIR HFA 108 (90 BASE) MCG/ACT inhaler Inhale 2 puffs into the lungs every 6 (six) hours as needed. For shortness of breath 1 Inhaler 6  . sucralfate (CARAFATE) 1 G tablet Take 1 tablet (1 g total) by mouth 4 (four) times daily. (Patient taking differently: Take 1 g by mouth 4 (four) times daily as needed. ) 60 tablet 0   No current facility-administered medications on file prior to visit.     BP (!)  168/83   Pulse 89   Temp 98.1 F (36.7 C) (Oral)   Resp 16   Ht 5' 10"  (1.778 m)   Wt 277 lb 6.4 oz (125.8 kg)   SpO2 98%   BMI 39.80 kg/m       Objective:   Physical Exam  General  Mental Status - Alert. General Appearance - Well groomed. Not in acute distress.  Skin Rashes- No Rashes.  HEENT Head- Normal. Ear Auditory Canal - Left- Normal. Right - Normal.Tympanic Membrane- Left- Normal. Right- Normal. Eye Sclera/Conjunctiva- Left- Normal. Right- Normal. Nose & Sinuses Nasal Mucosa- Left-  Boggy and Congested. Right-  Boggy and  Congested.Bilateral faint maxillary and faint frontal sinus pressure. Mouth & Throat Lips: Upper Lip- Normal: no dryness, cracking, pallor, cyanosis, or vesicular eruption. Lower Lip-Normal: no dryness, cracking, pallor, cyanosis or vesicular eruption. Buccal Mucosa- Bilateral- No Aphthous ulcers. Oropharynx- No Discharge or Erythema. Tonsils: Characteristics- Bilateral- No Erythema or Congestion. Size/Enlargement- Bilateral- No enlargement. Discharge- bilateral-None.  Neck Neck- Supple. No Masses.   Chest and Lung Exam Auscultation: Breath Sounds:-Clear even and unlabored.  But shallow bilaterally.  Cardiovascular Auscultation:Rythm- Regular, rate and rhythm. Murmurs & Other Heart Sounds:Ausculatation of the heart reveal- No Murmurs.  Lymphatic Head & Neck General Head & Neck Lymphatics: Bilateral: Description- No Localized lymphadenopathy.       Assessment & Plan:  .You had cough for 1 month with some residual sinus pressure symptoms.  Some intermittent productive cough as well.  You may have persistent bronchitis with residual sinus infection.  Also your severe cough  improve response with albuterol  so this causes concern for reactive airway disease.  Also history of remote smoking years ago.  I prescribed you doxycycline antibiotic and refilled your albuterol neb solution.  Also sent in prescription for Symbicort inhaler.   Please start Symbicort.  For severe cough, I prescribed Hycodan.  You reported prior use of narcotics post surgery and did not have reactions.  Some nausea and vomiting with codeine but this is does not mean you will have reaction to Hycodan.  Try just use Hycodan at night to help you sleep.  Please get chest x-ray today.  Follow-up in 7 to 10 days or as needed.  If symptoms persist despite above measures then will consider referring you to pulmonologist.  Mackie Pai, PA-C

## 2018-08-08 ENCOUNTER — Telehealth: Payer: Self-pay | Admitting: *Deleted

## 2018-08-08 NOTE — Telephone Encounter (Signed)
Tried calling patient, no answer, left detailed message on voicemail with results. Mychart message also sent to patient.

## 2018-08-08 NOTE — Telephone Encounter (Signed)
Copied from [place] [phone]. Topic: General - Other >> Aug 08, 2018 11:50 AM Judyann Munson wrote: Reason for CRM: patient is calling to request x-ray results. Please advise

## 2018-08-08 NOTE — Telephone Encounter (Signed)
Pt calling again to see if she can get x-ray results before the weekend.

## 2018-08-14 ENCOUNTER — Other Ambulatory Visit: Payer: Self-pay | Admitting: Family Medicine

## 2018-08-18 NOTE — Telephone Encounter (Signed)
Pt requesting refill on lorazepam.   Last OV: 08/07/2018 w/ Edward Last Fill: 07/07/2018 #60 and 0RF UDS: 04/10/2018 Low risk

## 2018-09-19 ENCOUNTER — Other Ambulatory Visit: Payer: Self-pay | Admitting: Family Medicine

## 2018-09-23 NOTE — Telephone Encounter (Signed)
Database ran and is on your desk for review.  Last filled per database: 08/18/18 Last written: 08/18/18 Last ov: 04/10/18 Next ov: none Contract: 04/01/19 UDS: 04/01/19

## 2018-10-23 ENCOUNTER — Ambulatory Visit: Payer: Self-pay | Admitting: Internal Medicine

## 2018-11-20 ENCOUNTER — Other Ambulatory Visit: Payer: Self-pay | Admitting: Family Medicine

## 2018-11-20 NOTE — Telephone Encounter (Signed)
Last written:09/23/18 Last ov: 08/07/18 Next ov: none Contract: 04/10/18 UDS: 04/11/19

## 2018-12-26 ENCOUNTER — Other Ambulatory Visit: Payer: Self-pay | Admitting: Surgery

## 2018-12-26 DIAGNOSIS — R911 Solitary pulmonary nodule: Secondary | ICD-10-CM

## 2019-01-03 ENCOUNTER — Other Ambulatory Visit: Payer: Self-pay | Admitting: Family Medicine

## 2019-01-13 ENCOUNTER — Telehealth: Payer: Self-pay | Admitting: *Deleted

## 2019-01-13 NOTE — Telephone Encounter (Signed)
Left message on machine to call back to set up virtual visit for follow up for depression/anxiety, hyperlipidemia

## 2019-01-20 ENCOUNTER — Other Ambulatory Visit: Payer: Self-pay | Admitting: Family Medicine

## 2019-01-20 ENCOUNTER — Telehealth: Payer: Self-pay | Admitting: *Deleted

## 2019-01-20 DIAGNOSIS — F419 Anxiety disorder, unspecified: Secondary | ICD-10-CM

## 2019-01-20 MED ORDER — LORAZEPAM 0.5 MG PO TABS: 0.5000 mg | ORAL_TABLET | Freq: Two times a day (BID) | ORAL | 1 refills | Status: DC | PRN

## 2019-01-20 NOTE — Telephone Encounter (Signed)
[facility] faxed request for lorazepam  Last written: 11/20/18 Last ov: 08/07/18 Next ov: none Contract: 04/11/19 UDS: 04/11/19

## 2019-01-20 NOTE — Telephone Encounter (Signed)
Need contract Refill sent

## 2019-01-31 ENCOUNTER — Other Ambulatory Visit: Payer: Self-pay | Admitting: Internal Medicine

## 2019-02-04 ENCOUNTER — Other Ambulatory Visit: Payer: Self-pay

## 2019-02-04 DIAGNOSIS — F419 Anxiety disorder, unspecified: Secondary | ICD-10-CM

## 2019-02-04 MED ORDER — LORAZEPAM 0.5 MG PO TABS: 0.5000 mg | ORAL_TABLET | Freq: Two times a day (BID) | ORAL | 1 refills | Status: DC | PRN

## 2019-02-04 NOTE — Telephone Encounter (Signed)
Resent rx request to Dr. Etter Sjogren.

## 2019-02-04 NOTE — Telephone Encounter (Signed)
**   States transmission to pharmacy failed**   Requesting: Ativan Contract: 04/10/2018 UDS: 04/10/2018, low risk Last OV: 08/07/2018  Next OV: N/A  Last Refill: 01/20/2019, #60--1 RF Database:   Please advise

## 2019-02-04 NOTE — Telephone Encounter (Signed)
Pt want to know status of refill request for lorazepam. Request that was sen 5.26.20 stated failed

## 2019-03-18 ENCOUNTER — Other Ambulatory Visit: Payer: BLUE CROSS/BLUE SHIELD

## 2019-03-18 ENCOUNTER — Ambulatory Visit: Payer: BLUE CROSS/BLUE SHIELD | Admitting: Surgery

## 2019-04-08 ENCOUNTER — Other Ambulatory Visit: Payer: Self-pay | Admitting: *Deleted

## 2019-04-08 MED ORDER — BUPROPION HCL ER (XL) 150 MG PO TB24: ORAL_TABLET | ORAL | 0 refills | Status: DC

## 2019-04-15 ENCOUNTER — Other Ambulatory Visit: Payer: Self-pay | Admitting: Family Medicine

## 2019-04-15 ENCOUNTER — Telehealth: Payer: Self-pay | Admitting: *Deleted

## 2019-04-15 DIAGNOSIS — F419 Anxiety disorder, unspecified: Secondary | ICD-10-CM

## 2019-04-15 MED ORDER — LORAZEPAM 0.5 MG PO TABS: 0.5000 mg | ORAL_TABLET | Freq: Two times a day (BID) | ORAL | 1 refills | Status: DC | PRN

## 2019-04-15 NOTE — Telephone Encounter (Signed)
[facility] requesting refill for l[facility]epam  Last written: 02/04/19 Last ov: 08/07/18 Next ov: none Contract: due UDS: due

## 2019-04-15 NOTE — Telephone Encounter (Signed)
done

## 2019-04-21 ENCOUNTER — Telehealth: Payer: Self-pay

## 2019-04-21 NOTE — Telephone Encounter (Signed)
PA initiated for [facility] system via Covermymeds. Awaiting determination for sensors and reader.

## 2019-04-22 NOTE — Telephone Encounter (Signed)
PA approved.  Effective from 04/21/2019 through 04/19/2020.

## 2019-06-02 ENCOUNTER — Other Ambulatory Visit: Payer: Self-pay | Admitting: *Deleted

## 2019-06-02 MED ORDER — PANTOPRAZOLE SODIUM 40 MG PO TBEC: DELAYED_RELEASE_TABLET | ORAL | 0 refills | Status: DC

## 2019-07-07 ENCOUNTER — Other Ambulatory Visit: Payer: Self-pay | Admitting: *Deleted

## 2019-07-07 DIAGNOSIS — D3A090 Benign carcinoid tumor of the bronchus and lung: Secondary | ICD-10-CM

## 2019-07-07 DIAGNOSIS — C3490 Malignant neoplasm of unspecified part of unspecified bronchus or lung: Secondary | ICD-10-CM

## 2019-07-07 DIAGNOSIS — R0602 Shortness of breath: Secondary | ICD-10-CM

## 2019-07-22 ENCOUNTER — Encounter: Payer: Self-pay | Admitting: Surgery

## 2019-07-22 ENCOUNTER — Ambulatory Visit: Payer: BLUE CROSS/BLUE SHIELD | Admitting: Surgery

## 2019-07-22 ENCOUNTER — Other Ambulatory Visit: Payer: BLUE CROSS/BLUE SHIELD

## 2019-07-22 ENCOUNTER — Other Ambulatory Visit: Payer: Self-pay

## 2019-07-22 VITALS — BP 122/84 | HR 105 | Temp 97.5°F | Resp 16 | Ht 70.0 in | Wt 272.0 lb

## 2019-07-22 DIAGNOSIS — C7A090 Malignant carcinoid tumor of the bronchus and lung: Secondary | ICD-10-CM | POA: Diagnosis not present

## 2019-07-22 DIAGNOSIS — Z09 Encounter for follow-up examination after completed treatment for conditions other than malignant neoplasm: Secondary | ICD-10-CM

## 2019-07-22 NOTE — Progress Notes (Signed)
HPI:  The patient returns today for routine surveillance followup status post left thoracotomy and wedge resection of a left lower lobe carcinoid tumor on 10/26/2011. This was a well-differentiated carcinoid with negative resection margins.She also had a 4 mm RUL nodule that had been seen on a previous CT but was not seen on subsequent CT of the chest.   She reports 2 episodes over the past several months of pulmonary difficulty with persistent coughing, wheezing, and shortness of breath.  This is been associated with some left-sided chest pain that sounds like it is in the chest wall.  She also reports "spasms" that keep her from taking a deep breath and improved with bronchodilators.  She has been treated with antibiotics during both these episodes with eventual improvement.  She has had longer-term problems with reflux symptoms with watery fluid in the back of her throat and subsequent coughing suggesting possible aspiration.  She sleeps in a semirecumbent position to help this.  Current Outpatient Medications  Medication Sig Dispense Refill  . ACCU-CHEK FASTCLIX LANCETS MISC   0  . ACCU-CHEK SMARTVIEW test strip TEST 4 TO 5 TIMES PER DAY 100 each 11  . albuterol (PROVENTIL) (2.5 MG/3ML) 0.083% nebulizer solution Take 3 mLs (2.5 mg total) by nebulization every 6 (six) hours as needed for wheezing or shortness of breath. 150 mL 1  . budesonide-formoterol (SYMBICORT) 80-4.5 MCG/ACT inhaler Inhale 2 puffs into the lungs 2 (two) times daily. 1 Inhaler 3  . buPROPion (WELLBUTRIN XL) 150 MG 24 hr tablet TAKE 1 TABLET(150 MG) BY MOUTH EVERY EVENING.  Need ov before any more refills. 90 tablet 0  . fluticasone (FLONASE) 50 MCG/ACT nasal spray Place 2 sprays into both nostrils daily. 16 g 6  . HYDROcodone-homatropine (HYCODAN) 5-1.5 MG/5ML syrup Take 5 mLs by mouth every 6 (six) hours as needed for cough. 100 mL 0  . ibuprofen (ADVIL,MOTRIN) 200 MG tablet Take 200 mg by mouth every 6 (six) hours as  needed for moderate pain.    Marland Kitchen insulin aspart (NOVOLOG FLEXPEN) 100 UNIT/ML injection Inject 25 Units into the skin 3 (three) times daily before meals.     Marland Kitchen LORazepam (ATIVAN) 0.5 MG tablet Take 1 tablet (0.5 mg total) by mouth 2 (two) times daily as needed. for anxiety 60 tablet 1  . metFORMIN (GLUMETZA) 500 MG (MOD) 24 hr tablet Take 500-1,000 mg by mouth See admin instructions. Pt takes 1059m in the morning and 5054min the evening    . ondansetron (ZOFRAN-ODT) 4 MG disintegrating tablet ondansetron 4 mg disintegrating tablet  dissolve 1 tablet ON TONGUE every 8 hours    . pantoprazole (PROTONIX) 40 MG tablet TAKE 1 TABLET(40 MG) BY MOUTH TWICE DAILY.  Needs ov before any more refills 60 tablet 0  . PROAIR HFA 108 (90 BASE) MCG/ACT inhaler Inhale 2 puffs into the lungs every 6 (six) hours as needed. For shortness of breath 1 Inhaler 6  . sucralfate (CARAFATE) 1 G tablet Take 1 tablet (1 g total) by mouth 4 (four) times daily. (Patient taking differently: Take 1 g by mouth 4 (four) times daily as needed. ) 60 tablet 0  . Insulin Glargine (TOUJEO MAX SOLOSTAR) 300 UNIT/ML SOPN Inject 52 Units into the skin 2 (two) times daily. (Patient not taking: Reported on 07/22/2019) 6 pen 11  . insulin regular human CONCENTRATED (HUMULIN R U-500 KWIKPEN) 500 UNIT/ML kwikpen Inject 40-60 units 3x a day under skin (Patient not taking: Reported on 07/22/2019) 4  pen 3   No current facility-administered medications for this visit.      Physical Exam: BP 122/84 (BP Location: Right Arm, Patient Position: Sitting, Cuff Size: Large)   Pulse (!) 105   Temp (!) 97.5 F (36.4 C)   Resp 16   Ht 5' 10"  (1.778 m)   Wt 272 lb (123.4 kg)   SpO2 98% Comment: RA  BMI 39.03 kg/m  She looks well. Lungs are clear.  Diagnostic Tests:  CT scan of the chest was done today at [facility] in [place].  I have personally reviewed the images on disc and reviewed the official report.  The  radiology report notes no acute abnormality with stable postsurgical changes in the left lower lobe.  There are findings concerning for possible cirrhosis with sequelae of portal hypertension including splenomegaly.  Impression:  She is now about 7-1/2 years following surgical resection of a carcinoid tumor from the left lower lobe of the lung.  There is no evidence of recurrence.  There is no other abnormality noted in the lung or pleural space.  I have recommended that she have a follow-up scan in 1 year.  She does appear to have recurrent pulmonary symptoms suggesting possible bronchitis.  She may benefit from seeing a pulmonologist for longer-term management of this since it seems to significantly affect her ability to be functional when it occurs.  She is going to discuss that with her PCP.  Plan:  She will continue to follow-up with her PCP and I will plan to see her back in 1 year with a CT scan of the chest without contrast.  I spent 15 minutes performing this established patient evaluation and > 50% of this time was spent face to face counseling and coordinating the surveillance of this patient's resected carcinoid tumor of the lung.    Gaye Pollack, MD [facility] [phone]

## 2019-10-12 ENCOUNTER — Ambulatory Visit: Payer: BLUE CROSS/BLUE SHIELD | Admitting: Family Medicine

## 2019-10-16 ENCOUNTER — Other Ambulatory Visit: Payer: Self-pay | Admitting: Family Medicine

## 2019-10-16 ENCOUNTER — Telehealth: Payer: Self-pay | Admitting: Family Medicine

## 2019-10-16 MED ORDER — ONDANSETRON 4 MG PO TBDP: ORAL_TABLET | ORAL | 0 refills | Status: DC

## 2019-10-16 NOTE — Telephone Encounter (Signed)
Please advise 

## 2019-10-16 NOTE — Telephone Encounter (Signed)
Pt advised.

## 2019-10-16 NOTE — Telephone Encounter (Signed)
done

## 2019-10-16 NOTE — Telephone Encounter (Signed)
Medication: ondansetron (ZOFRAN-ODT) 4 MG disintegrating tablet    Has the patient contacted their pharmacy? No. (If no, request that the patient contact the pharmacy for the refill.) (If yes, when and what did the pharmacy advise?)  Preferred Pharmacy (with phone number or street name):    [id number] [place], [place]  [address] [place] [address]  Phone:  [phone] Fax:  [phone]  DEA #:  [id number]   Agent: Please be advised that RX refills may take up to 3 business days. We ask that you follow-up with your pharmacy.

## 2019-10-16 NOTE — Telephone Encounter (Signed)
If she is having symptoms we can do a virtual visit She can use mucinex for cough, tyelnol for fever  If she is very sob--- she should go to er

## 2019-10-16 NOTE — Telephone Encounter (Signed)
Patient states recent Covid + dx , please advise patient on what to do. Patient had lung cancer in the past and is wondering what she can take to combat covid . Pt would like Dr Etter Sjogren pls advise.   [phone]

## 2019-10-16 NOTE — Telephone Encounter (Signed)
Please advice. Medication on patients list but without prescribing provider.

## 2019-10-19 ENCOUNTER — Other Ambulatory Visit: Payer: Self-pay

## 2019-10-19 ENCOUNTER — Emergency Department (HOSPITAL_COMMUNITY): Payer: 59

## 2019-10-19 ENCOUNTER — Telehealth: Payer: Self-pay | Admitting: Family Medicine

## 2019-10-19 ENCOUNTER — Encounter (HOSPITAL_COMMUNITY): Payer: Self-pay | Admitting: Emergency Medicine

## 2019-10-19 ENCOUNTER — Emergency Department (HOSPITAL_COMMUNITY)
Admission: EM | Admit: 2019-10-19 | Discharge: 2019-10-19 | Disposition: A | Discharge status: Home / Self Care | Payer: 59 | Attending: Emergency Medicine | Admitting: Emergency Medicine

## 2019-10-19 ENCOUNTER — Other Ambulatory Visit: Payer: Self-pay | Admitting: Adult Health

## 2019-10-19 DIAGNOSIS — R05 Cough: Secondary | ICD-10-CM | POA: Insufficient documentation

## 2019-10-19 DIAGNOSIS — E871 Hypo-osmolality and hyponatremia: Secondary | ICD-10-CM | POA: Insufficient documentation

## 2019-10-19 DIAGNOSIS — R1110 Vomiting, unspecified: Secondary | ICD-10-CM | POA: Insufficient documentation

## 2019-10-19 DIAGNOSIS — Z87891 Personal history of nicotine dependence: Secondary | ICD-10-CM | POA: Insufficient documentation

## 2019-10-19 DIAGNOSIS — Z794 Long term (current) use of insulin: Secondary | ICD-10-CM | POA: Insufficient documentation

## 2019-10-19 DIAGNOSIS — U071 COVID-19: Secondary | ICD-10-CM | POA: Diagnosis not present

## 2019-10-19 DIAGNOSIS — R531 Weakness: Secondary | ICD-10-CM | POA: Insufficient documentation

## 2019-10-19 DIAGNOSIS — R7989 Other specified abnormal findings of blood chemistry: Secondary | ICD-10-CM | POA: Insufficient documentation

## 2019-10-19 DIAGNOSIS — R0602 Shortness of breath: Secondary | ICD-10-CM | POA: Diagnosis not present

## 2019-10-19 DIAGNOSIS — E1140 Type 2 diabetes mellitus with diabetic neuropathy, unspecified: Secondary | ICD-10-CM | POA: Insufficient documentation

## 2019-10-19 DIAGNOSIS — J1282 Pneumonia due to coronavirus disease 2019: Secondary | ICD-10-CM | POA: Diagnosis not present

## 2019-10-19 DIAGNOSIS — Z79899 Other long term (current) drug therapy: Secondary | ICD-10-CM | POA: Diagnosis not present

## 2019-10-19 DIAGNOSIS — R509 Fever, unspecified: Secondary | ICD-10-CM | POA: Diagnosis present

## 2019-10-19 LAB — D-DIMER, QUANTITATIVE: D-Dimer, Quant: 1.43 ug/mL-FEU — ABNORMAL HIGH (ref 0.00–0.50)

## 2019-10-19 LAB — COMPREHENSIVE METABOLIC PANEL
ALT: 23 U/L (ref 0–44)
AST: 29 U/L (ref 15–41)
Albumin: 2.8 g/dL — ABNORMAL LOW (ref 3.5–5.0)
Alkaline Phosphatase: 109 U/L (ref 38–126)
Anion gap: 14 (ref 5–15)
BUN: 10 mg/dL (ref 6–20)
CO2: 20 mmol/L — ABNORMAL LOW (ref 22–32)
Calcium: 8.5 mg/dL — ABNORMAL LOW (ref 8.9–10.3)
Chloride: 98 mmol/L (ref 98–111)
Creatinine, Ser: 0.64 mg/dL (ref 0.44–1.00)
GFR calc Af Amer: >60 mL/min (ref >60)
GFR calc non Af Amer: >60 mL/min (ref >60)
Glucose, Bld: 322 mg/dL — ABNORMAL HIGH (ref 70–99)
Potassium: 4.3 mmol/L (ref 3.5–5.1)
Sodium: 132 mmol/L — ABNORMAL LOW (ref 135–145)
Total Bilirubin: 1.2 mg/dL (ref 0.3–1.2)
Total Protein: 6.9 g/dL (ref 6.5–8.1)

## 2019-10-19 LAB — URINALYSIS, ROUTINE W REFLEX MICROSCOPIC
Bilirubin Urine: NEGATIVE
Glucose, UA: >=500 mg/dL — AB
Hgb urine dipstick: NEGATIVE
Ketones, ur: 15 mg/dL — AB
Leukocytes,Ua: NEGATIVE
Nitrite: NEGATIVE
Protein, ur: NEGATIVE mg/dL
Specific Gravity, Urine: <1.005 — ABNORMAL LOW (ref 1.005–1.030)
pH: 6.5 (ref 5.0–8.0)

## 2019-10-19 LAB — CBC WITH DIFFERENTIAL/PLATELET
Abs Immature Granulocytes: 0.03 10*3/uL (ref 0.00–0.07)
Basophils Absolute: 0 10*3/uL (ref 0.0–0.1)
Basophils Relative: 0 %
Eosinophils Absolute: 0 10*3/uL (ref 0.0–0.5)
Eosinophils Relative: 0 %
HCT: 39.9 % (ref 36.0–46.0)
Hemoglobin: 13.3 g/dL (ref 12.0–15.0)
Immature Granulocytes: 1 %
Lymphocytes Relative: 17 %
Lymphs Abs: 0.8 10*3/uL (ref 0.7–4.0)
MCH: 27.6 pg (ref 26.0–34.0)
MCHC: 33.3 g/dL (ref 30.0–36.0)
MCV: 82.8 fL (ref 80.0–100.0)
Monocytes Absolute: 0.3 10*3/uL (ref 0.1–1.0)
Monocytes Relative: 7 %
Neutro Abs: 3.3 10*3/uL (ref 1.7–7.7)
Neutrophils Relative %: 75 %
Platelets: 146 10*3/uL — ABNORMAL LOW (ref 150–400)
RBC: 4.82 MIL/uL (ref 3.87–5.11)
RDW: 14.6 % (ref 11.5–15.5)
WBC: 4.4 10*3/uL (ref 4.0–10.5)
nRBC: 0 % (ref 0.0–0.2)

## 2019-10-19 LAB — PROTIME-INR
INR: 1.2 (ref 0.8–1.2)
Prothrombin Time: 15 seconds (ref 11.4–15.2)

## 2019-10-19 LAB — URINALYSIS, MICROSCOPIC (REFLEX): Bacteria, UA: NONE SEEN

## 2019-10-19 LAB — LACTIC ACID, PLASMA: Lactic Acid, Venous: 1.4 mmol/L (ref 0.5–1.9)

## 2019-10-19 LAB — LACTATE DEHYDROGENASE: LDH: 197 U/L — ABNORMAL HIGH (ref 98–192)

## 2019-10-19 LAB — APTT: aPTT: 41 seconds — ABNORMAL HIGH (ref 24–36)

## 2019-10-19 LAB — SARS CORONAVIRUS 2 (TAT 6-24 HRS): SARS Coronavirus 2: POSITIVE — AB

## 2019-10-19 LAB — C-REACTIVE PROTEIN: CRP: 15.5 mg/dL — ABNORMAL HIGH (ref <1.0)

## 2019-10-19 LAB — FERRITIN: Ferritin: 144 ng/mL (ref 11–307)

## 2019-10-19 LAB — FIBRINOGEN: Fibrinogen: 676 mg/dL — ABNORMAL HIGH (ref 210–475)

## 2019-10-19 MED ORDER — IOHEXOL 350 MG/ML SOLN
100.0000 mL | Freq: Once | INTRAVENOUS | Status: AC
  Administered 2019-10-19: 80 mL via INTRAVENOUS

## 2019-10-19 MED ORDER — LEVOFLOXACIN IN D5W 750 MG/150ML IV SOLN
750.0000 mg | Freq: Once | INTRAVENOUS | Status: AC
  Administered 2019-10-19: 750 mg via INTRAVENOUS
  Filled 2019-10-19: qty 150

## 2019-10-19 MED ORDER — ACETAMINOPHEN 325 MG PO TABS
650.0000 mg | ORAL_TABLET | Freq: Once | ORAL | Status: AC
  Administered 2019-10-19: 650 mg via ORAL
  Filled 2019-10-19: qty 2

## 2019-10-19 MED ORDER — SODIUM CHLORIDE 0.9 % IV BOLUS
1000.0000 mL | Freq: Once | INTRAVENOUS | Status: AC
  Administered 2019-10-19: 1000 mL via INTRAVENOUS

## 2019-10-19 MED ORDER — ALBUTEROL SULFATE HFA 108 (90 BASE) MCG/ACT IN AERS
6.0000 | INHALATION_SPRAY | Freq: Once | RESPIRATORY_TRACT | Status: AC
  Administered 2019-10-19: 6 via RESPIRATORY_TRACT
  Filled 2019-10-19: qty 6.7

## 2019-10-19 MED ORDER — ONDANSETRON HCL 4 MG/2ML IJ SOLN
4.0000 mg | Freq: Once | INTRAMUSCULAR | Status: AC
  Administered 2019-10-19: 4 mg via INTRAVENOUS
  Filled 2019-10-19: qty 2

## 2019-10-19 MED ORDER — PROCHLORPERAZINE EDISYLATE 10 MG/2ML IJ SOLN
10.0000 mg | Freq: Once | INTRAMUSCULAR | Status: AC
  Administered 2019-10-19: 10 mg via INTRAVENOUS
  Filled 2019-10-19: qty 2

## 2019-10-19 NOTE — ED Notes (Signed)
Ambulated pt on RA, pt Sp02 remained 93 and above while ambulating.

## 2019-10-19 NOTE — ED Triage Notes (Signed)
Pt BIB [facility] from HOME c/o increased weakness since COVID+ Friday 10/19/19.   Pt. States she has had decreased appetite, increasing SOB, Fevers, NVD, body aches, loss of taste, loss of smell, and dry cough. HX lung cancer, removal of LLL. Taking ibuprofen, albuterol, and zofran with no relief of s/s.   Pt ambulatory on arrival. EMS reports VS [facility].

## 2019-10-19 NOTE — ED Provider Notes (Signed)
[facility] EMERGENCY DEPARTMENT Provider Note   CSN: [id number] Arrival date & time: 10/19/19  0503   History Chief Complaint: COVID  Kimberly [name] is a 59 y.o. female.  The history is provided by the patient.  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  She states that 1 week ago, she lost her sense of smell and taste.  She did have a COVID-19 test done 3 days ago which was positive.  Over the past week, she has had fevers as high as 101 degrees with the associated chills but no sweats.  There has been a nonproductive cough for which she has been using albuterol with only limited relief.  She is now starting to feel weaker and have some shortness of breath.  She talked with her PCP who advised her to come to the ED so she could get started on remdesivir and hydroxychloroquine.  Past Medical History:  Diagnosis Date  . Anxiety    takes Ativan as needed  . Bronchitis    hx of;last time > 20yrago  . Cancer ([facility]    part of left lower lung removed- lung ca 2013  . Colitis   . Colon polyps    hx of  . Complication of anesthesia    gas and MAC procedures  . Depression    takes Wellbutrin daily  . Diabetes mellitus    Novolog and Levemir daily  . DM2 (diabetes mellitus, type 2) ([place]   . Eczema   . GERD (gastroesophageal reflux disease)    takes Protonix nightly  . Headache(784.0)    stress HA frequently  . IBS (irritable bowel syndrome)   . Lung mass    left upper lobe  . Pneumonia    walking pneumonia in 2007  . PONV (postoperative nausea and vomiting)   . Shortness of breath    certain times of the year  . Stress incontinence     Patient Active Problem List   Diagnosis Date Noted  . Situational anxiety 04/10/2018  . Hyperlipidemia LDL goal <70 04/10/2018  . DM (diabetes mellitus) type II uncontrolled, periph vascular disorder  ([place] 04/10/2018  . Viral upper respiratory tract infection 04/10/2018  . Tennis elbow 04/10/2018  . Grief at loss of child 10/22/2017  . Pain in right wrist 10/22/2017  . Chest pain 10/22/2017  . Gastric reflux with aspiration 02/15/2016  . Nodule of left lung 10/29/2011  . ACUTE BRONCHOSPASM 09/08/2009  . COUGH 09/08/2009  . ACUTE SINUSITIS, UNSPECIFIED 05/23/2009  . HYPERTENSION 06/29/2008  . CONTACT DERMATITIS&OTHER ECZEMA DUE TO PLANTS 06/29/2008  . CANDIDIASIS, VAGINAL 05/05/2008  . ACUTE BRONCHITIS 04/29/2008  . VARICOSE VEINS, LOWER EXTREMITIES 11/14/2007  . HIP PAIN, LEFT 11/14/2007  . MYALGIA 11/14/2007  . FATIGUE 11/14/2007  . Depression with anxiety 07/04/2007  . Poorly controlled type 2 diabetes mellitus with peripheral neuropathy ([place] 01/06/2007  . MYOCLONUS 01/06/2007  . Common migraine with intractable migraine 01/06/2007  . FATTY LIVER DISEASE 01/06/2007  . GESTATIONAL DIABETES 01/06/2007  . TOBACCO USE, QUIT 01/06/2007    Past Surgical History:  Procedure Laterality Date  . BIOPSY THYROID     pt has thyroid polyps another scan in Mar 2013  . CAESAREAN SECTION  96/98/2000  . colonosocpy    . Rectal fistula  1993  . THORACOTOMY  10/26/2011   Procedure: THORACOTOMY MAJOR;  Surgeon: BGaye Pollack MD;  Location: [facility] OR;  Service: Thoracic;  Laterality: Left;  . UTERINE FIBROID SURGERY     late 30's early 26's     OB History    Gravida  5   Para  4   Term      Preterm      AB      Living        SAB      TAB      Ectopic      Multiple      Live Births              Family History  Problem Relation Age of Onset  . Hyperlipidemia Mother   . Diabetes Mother   . Cancer Mother        kidney   . Cancer Father        liver  . Breast cancer Maternal Aunt   . Heart disease Maternal Grandmother   . Cancer Maternal Grandfather        kidney   . Heart disease Maternal Grandfather   . Hyperlipidemia Maternal Grandfather   . Breast  cancer Other   . Anesthesia problems Sister   . Migraines Sister   . Hypotension Neg Hx   . Malignant hyperthermia Neg Hx   . Pseudochol deficiency Neg Hx     Social History   Tobacco Use  . Smoking status: Former Smoker    Types: Cigarettes    Quit date: 08/27/1982    Years since quitting: 37.1  . Smokeless tobacco: Never Used  Substance Use Topics  . Alcohol use: No  . Drug use: No    Home Medications Prior to Admission medications   Medication Sig Start Date End Date Taking? Authorizing Provider  ACCU-CHEK FASTCLIX LANCETS Marie  03/17/16   [provider]  ACCU-CHEK SMARTVIEW test strip TEST 4 TO 5 TIMES PER DAY 02/02/19   Philemon Kingdom, MD  albuterol (PROVENTIL) (2.5 MG/3ML) 0.083% nebulizer solution Take 3 mLs (2.5 mg total) by nebulization every 6 (six) hours as needed for wheezing or shortness of breath. 08/07/18   Saguier, Percell Miller, PA-C  budesonide-formoterol (SYMBICORT) 80-4.5 MCG/ACT inhaler Inhale 2 puffs into the lungs 2 (two) times daily. 08/07/18   Saguier, Percell Miller, PA-C  buPROPion (WELLBUTRIN XL) 150 MG 24 hr tablet TAKE 1 TABLET(150 MG) BY MOUTH EVERY EVENING.  Need ov before any more refills. 04/08/19   Roma Schanz R, DO  fluticasone (FLONASE) 50 MCG/ACT nasal spray Place 2 sprays into both nostrils daily. 04/10/18   [name], DO  HYDROcodone-homatropine (HYCODAN) 5-1.5 MG/5ML syrup Take 5 mLs by mouth every 6 (six) hours as needed for cough. 08/07/18   Saguier, Percell Miller, PA-C  ibuprofen (ADVIL,MOTRIN) 200 MG tablet Take 200 mg by mouth every 6 (six) hours as needed for moderate pain.    [provider]  insulin aspart (NOVOLOG FLEXPEN) 100 UNIT/ML injection Inject 25 Units into the skin 3 (three) times daily before meals.     [provider]  Insulin Glargine (TOUJEO MAX SOLOSTAR) 300 UNIT/ML SOPN Inject 52 Units into the skin 2 (two) times daily. Patient not taking: Reported on 07/22/2019 12/05/17   Philemon Kingdom, MD   insulin regular human CONCENTRATED (HUMULIN R U-500 KWIKPEN) 500 UNIT/ML kwikpen Inject 40-60 units 3x a day under skin Patient not taking: Reported on 07/22/2019 03/13/18   Philemon Kingdom, MD  LORazepam (ATIVAN) 0.5 MG tablet Take 1 tablet (0.5 mg total) by mouth 2 (two) times  daily as needed. for anxiety 04/15/19   Carollee Herter, Alferd Apa, DO  metFORMIN (GLUMETZA) 500 MG (MOD) 24 hr tablet Take 500-1,000 mg by mouth See admin instructions. Pt takes 1053m in the morning and 5042min the evening    [provider]  ondansetron (ZOFRAN-ODT) 4 MG disintegrating tablet ondansetron 4 mg disintegrating tablet  dissolve 1 tablet ON TONGUE every 8 hours 10/16/19   LoCarollee HerterYvonne R, DO  pantoprazole (PROTONIX) 40 MG tablet TAKE 1 TABLET(40 MG) BY MOUTH TWICE DAILY.  Needs ov before any more refills 06/02/19   LoCarollee HerterYvonne R, DO  PROAIR HFA 108 (90 BASE) MCG/ACT inhaler Inhale 2 puffs into the lungs every 6 (six) hours as needed. For shortness of breath 02/04/15   BaGaye PollackMD  sucralfate (CARAFATE) 1 G tablet Take 1 tablet (1 g total) by mouth 4 (four) times daily. Patient taking differently: Take 1 g by mouth 4 (four) times daily as needed.  08/01/13   JaTanna FurryMD    Allergies    Codeine, Diflucan [fluconazole], Metoclopramide hcl, Penicillins, and Prednisone  Review of Systems   Review of Systems  All other systems reviewed and are negative.   Physical Exam Updated Vital Signs BP (!) 153/74 (BP Location: Right Arm)   Pulse (!) 112   Temp (!) 102.4 F (39.1 C) (Oral)   Resp 20   Ht _0  (1.778 m)   Wt 124.7 kg   SpO2 92%   BMI 39.46 kg/m   Physical Exam Vitals and nursing note reviewed.   [age]ear old female, appears mildly uncomfortable, but is in no acute distress. Vital signs are significant for elevated temperature, blood pressure, heart rate. Oxygen saturation is 92%, which is normal. Head is normocephalic and atraumatic. PERRLA, EOMI. Oropharynx is  clear. Neck is nontender and supple without adenopathy or JVD. Back is nontender and there is no CVA tenderness. Lungs are clear without rales, wheezes, or rhonchi. Chest is nontender. Heart has regular rate and rhythm without murmur. Abdomen is soft, flat, nontender without masses or hepatosplenomegaly and peristalsis is hypoactive. Extremities have no cyanosis or edema, full range of motion is present. Skin is warm and dry without rash. Neurologic: Mental status is normal, cranial nerves are intact, there are no motor or sensory deficits.  ED Results / Procedures / Treatments   Labs (all labs ordered are listed, but only abnormal results are displayed) Labs Reviewed - No data to display  EKG EKG Interpretation  Date/Time:  Monday October 19 2019 05:11:29 EST Ventricular Rate:  110 PR Interval:    QRS Duration: 87 QT Interval:  344 QTC Calculation: 466 R Axis:   12 Text Interpretation: Sinus tachycardia Otherwise within normal limits When compared with ECG of 10/24/2011, HEART RATE has increased Confirmed by GlDelora [email or web address] 10/19/2019 5:18:47 AM   Radiology No results found.  Procedures Procedures (including critical care time)  Medications Ordered in ED Medications - No data to display  ED Course  I have reviewed the triage vital signs and the nursing notes.  Pertinent labs & imaging results that were available during my care of the patient were reviewed by me and considered in my medical decision making (see chart for details).  MDM Rules/Calculators/A&P COVID-19 infection with progressive weakness and dyspnea.  She is maintaining an adequate oxygen saturation on room air at rest.  Chest x-ray does show bilateral infiltrates consistent with COVID-19 pneumonia.  Because of fever and infiltrates, sepsis  protocol was initiated.  She has been given acetaminophen for fever and is given IV fluids.  We will also give albuterol inhaler to help with her cough.  Old  records are reviewed showing an ongoing problem with bronchitis.  Wedge resection of carcinoid tumor greater than 7 years ago is not felt to be a risk factor for COVID-19.  D-dimer has come back elevated, so she will be sent for CT angiogram to rule out concurrent pulmonary emboli.  Other labs show mild hyponatremia which is not felt to be clinically significant.  CRP and LDH are both elevated consistent with Covid infection.  Case is signed out to Dr. Maryan Rued pending results of CT angiogram.  YURIANA GAAL was evaluated in Emergency Department on 10/19/2019 for the symptoms described in the history of present illness. She was evaluated in the context of the global COVID-19 pandemic, which necessitated consideration that the patient might be at risk for infection with the SARS-CoV-2 virus that causes COVID-19. Institutional protocols and algorithms that pertain to the evaluation of patients at risk for COVID-19 are in a state of rapid change based on information released by regulatory bodies including the CDC and federal and state organizations. These policies and algorithms were followed during the patient's care in the ED.  Final Clinical Impression(s) / ED Diagnoses Final diagnoses:  Pneumonia due to COVID-19 virus  Elevated d-dimer  Hyponatremia    Rx / DC Orders ED Discharge Orders    None       Delora Fuel, MD 95/36/92 [phone]

## 2019-10-19 NOTE — Telephone Encounter (Signed)
Pt advised. Message left with the infusion center with pts info

## 2019-10-19 NOTE — Progress Notes (Signed)
  I connected by phone with [name] on 10/19/2019 at 7:35 PM to discuss the potential use of an new treatment for mild to moderate COVID-19 viral infection in non-hospitalized patients.  This patient is a 59 y.o. female that meets the FDA criteria for Emergency Use Authorization of bamlanivimab or casirivimab\imdevimab.  Has a (+) direct SARS-CoV-2 viral test result  Has mild or moderate COVID-19   Is ? 60 years of age and weighs ? 40 kg  Is NOT hospitalized due to COVID-19  Is NOT requiring oxygen therapy or requiring an increase in baseline oxygen flow rate due to COVID-19  Is within 10 days of symptom onset  Has at least one of the high risk factor(s) for progression to severe COVID-19 and/or hospitalization as defined in EUA.  Specific high risk criteria : Diabetes   I have spoken and communicated the following to the patient or parent/caregiver:  1. FDA has authorized the emergency use of bamlanivimab and casirivimab\imdevimab for the treatment of mild to moderate COVID-19 in adults and pediatric patients with positive results of direct SARS-CoV-2 viral testing who are 42 years of age and older weighing at least 40 kg, and who are at high risk for progressing to severe COVID-19 and/or hospitalization.  2. The significant known and potential risks and benefits of bamlanivimab and casirivimab\imdevimab, and the extent to which such potential risks and benefits are unknown.  3. Information on available alternative treatments and the risks and benefits of those alternatives, including clinical trials.  4. Patients treated with bamlanivimab and casirivimab\imdevimab should continue to self-isolate and use infection control measures (e.g., wear mask, isolate, social distance, avoid sharing personal items, clean and disinfect "high touch" surfaces, and frequent handwashing) according to CDC guidelines.   5. The patient or parent/caregiver has the option to accept or refuse  bamlanivimab or casirivimab\imdevimab .  Patient seen in ER on 10/19/19 , + covid PNA . O2 sats okay. Patient is checking o2 sats at home , 94% on room air. Advised if symptoms worsened before infusion with increased dyspnea, drop in O2 sats , will need to go to ER for evaluation. Not to wait for infusion as it is for stable mild to moderate Covid 19 symptoms.   After reviewing this information with the patient, The patient agreed to proceed with receiving the bamlanimivab infusion and will be provided a copy of the Fact sheet prior to receiving the infusion..  Scheduled for infusion 10/20/19 at 1430.   Graceland Wachter 10/19/2019 7:35 PM

## 2019-10-19 NOTE — Telephone Encounter (Signed)
Patient states that she was release from the hospital and she would like an infusion offered at [facility] for Covid, please advised.  Patient would like Dr Etter Sjogren to set up infusion. Marland Kitchen

## 2019-10-19 NOTE — Discharge Instructions (Signed)
It appears that you have pneumonia from Covid today.  Your oxygen still looks good and the rest of your blood work is consistent with Covid but otherwise okay.  No sign of blood clots.  If your shortness of breath becomes worse and you cannot catch her breath even at rest please return to the emergency room.

## 2019-10-19 NOTE — ED Provider Notes (Signed)
CT was negative for PE but did show bilateral patchy pneumonia which is probably the cause for the patient's shortness of breath.  Patient ambulated with sats remaining greater than 93% on room air.  Patient wishes to go home.  Requested that she contact her doctor later today to see if she can be set up for remdesivir infusion as an outpatient.  Also cautioned her to return if symptoms worsen.   Blanchie Dessert, MD 10/19/19 1031

## 2019-10-20 ENCOUNTER — Ambulatory Visit (HOSPITAL_COMMUNITY): Admission: RE | Admit: 2019-10-20 | Payer: Medicaid Other | Source: Ambulatory Visit

## 2019-10-20 LAB — URINE CULTURE

## 2019-10-21 ENCOUNTER — Telehealth: Payer: Self-pay | Admitting: Family Medicine

## 2019-10-21 ENCOUNTER — Ambulatory Visit (HOSPITAL_COMMUNITY)
Admission: RE | Admit: 2019-10-21 | Discharge: 2019-10-21 | Disposition: A | Discharge status: Home / Self Care | Payer: 59 | Source: Ambulatory Visit | Attending: Pulmonary Disease | Admitting: Pulmonary Disease

## 2019-10-21 DIAGNOSIS — E119 Type 2 diabetes mellitus without complications: Secondary | ICD-10-CM | POA: Diagnosis not present

## 2019-10-21 DIAGNOSIS — U071 COVID-19: Secondary | ICD-10-CM | POA: Insufficient documentation

## 2019-10-21 MED ORDER — SODIUM CHLORIDE 0.9 % IV SOLN
700.0000 mg | Freq: Once | INTRAVENOUS | Status: AC
  Administered 2019-10-21: 700 mg via INTRAVENOUS
  Filled 2019-10-21: qty 20

## 2019-10-21 MED ORDER — SODIUM CHLORIDE 0.9 % IV SOLN
INTRAVENOUS | Status: DC | PRN
  Administered 2019-10-21: 250 mL via INTRAVENOUS

## 2019-10-21 MED ORDER — ALBUTEROL SULFATE HFA 108 (90 BASE) MCG/ACT IN AERS: 2.0000 | INHALATION_SPRAY | Freq: Once | RESPIRATORY_TRACT | Status: DC | PRN

## 2019-10-21 MED ORDER — METHYLPREDNISOLONE SODIUM SUCC 125 MG IJ SOLR: 125.0000 mg | Freq: Once | INTRAMUSCULAR | Status: DC | PRN

## 2019-10-21 MED ORDER — DIPHENHYDRAMINE HCL 50 MG/ML IJ SOLN: 50.0000 mg | Freq: Once | INTRAMUSCULAR | Status: DC | PRN

## 2019-10-21 MED ORDER — FAMOTIDINE IN NACL 20-0.9 MG/50ML-% IV SOLN: 20.0000 mg | Freq: Once | INTRAVENOUS | Status: DC | PRN

## 2019-10-21 MED ORDER — EPINEPHRINE 0.3 MG/0.3ML IJ SOAJ: 0.3000 mg | Freq: Once | INTRAMUSCULAR | Status: DC | PRN

## 2019-10-21 NOTE — Telephone Encounter (Signed)
Yes -- she is getting iv infusion too

## 2019-10-21 NOTE — Progress Notes (Signed)
  Diagnosis: COVID-19  Physician: Dr. Joya Gaskins  Procedure: Covid Infusion Clinic Med: bamlanivimab infusion - Provided patient with bamlanimivab fact sheet for patients, parents and caregivers prior to infusion.  Complications: No immediate complications noted.  Discharge: Discharged home   Babs Sciara 10/21/2019

## 2019-10-21 NOTE — Telephone Encounter (Signed)
Resp clinic? Please advice

## 2019-10-21 NOTE — Telephone Encounter (Signed)
Patient requesting advice about her swollen tongue, patient would like to know what to do to make symptom go away. Patient is Covid positive.   Please Advise

## 2019-10-21 NOTE — Telephone Encounter (Signed)
Resp appointment made

## 2019-10-21 NOTE — Discharge Instructions (Signed)
COVID-19 COVID-19 is a respiratory infection that is caused by a virus called severe acute respiratory syndrome coronavirus 2 (SARS-CoV-2). The disease is also known as coronavirus disease or novel coronavirus. In some people, the virus may not cause any symptoms. In others, it may cause a serious infection. The infection can get worse quickly and can lead to complications, such as:  Pneumonia, or infection of the lungs.  Acute respiratory distress syndrome or ARDS. This is a condition in which fluid build-up in the lungs prevents the lungs from filling with air and passing oxygen into the blood.  Acute respiratory failure. This is a condition in which there is not enough oxygen passing from the lungs to the body or when carbon dioxide is not passing from the lungs out of the body.  Sepsis or septic shock. This is a serious bodily reaction to an infection.  Blood clotting problems.  Secondary infections due to bacteria or fungus.  Organ failure. This is when your body's organs stop working. The virus that causes COVID-19 is contagious. This means that it can spread from person to person through droplets from coughs and sneezes (respiratory secretions). What are the causes? This illness is caused by a virus. You may catch the virus by:  Breathing in droplets from an infected person. Droplets can be spread by a person breathing, speaking, singing, coughing, or sneezing.  Touching something, like a table or a doorknob, that was exposed to the virus (contaminated) and then touching your mouth, nose, or eyes. What increases the risk? Risk for infection You are more likely to be infected with this virus if you:  Are within 6 feet (2 meters) of a person with COVID-19.  Provide care for or live with a person who is infected with COVID-19.  Spend time in crowded indoor spaces or live in shared housing. Risk for serious illness You are more likely to become seriously ill from the virus if  you:  Are 50 years of age or older. The higher your age, the more you are at risk for serious illness.  Live in a nursing home or long-term care facility.  Have cancer.  Have a long-term (chronic) disease such as: ? Chronic lung disease, including chronic obstructive pulmonary disease or asthma. ? A long-term disease that lowers your body's ability to fight infection (immunocompromised). ? Heart disease, including heart failure, a condition in which the arteries that lead to the heart become narrow or blocked (coronary artery disease), a disease which makes the heart muscle thick, weak, or stiff (cardiomyopathy). ? Diabetes. ? Chronic kidney disease. ? Sickle cell disease, a condition in which red blood cells have an abnormal "sickle" shape. ? Liver disease.  Are obese. What are the signs or symptoms? Symptoms of this condition can range from mild to severe. Symptoms may appear any time from 2 to 14 days after being exposed to the virus. They include:  A fever or chills.  A cough.  Difficulty breathing.  Headaches, body aches, or muscle aches.  Runny or stuffy (congested) nose.  A sore throat.  New loss of taste or smell. Some people may also have stomach problems, such as nausea, vomiting, or diarrhea. Other people may not have any symptoms of COVID-19. How is this diagnosed? This condition may be diagnosed based on:  Your signs and symptoms, especially if: ? You live in an area with a COVID-19 outbreak. ? You recently traveled to or from an area where the virus is common. ? You   provide care for or live with a person who was diagnosed with COVID-19. ? You were exposed to a person who was diagnosed with COVID-19.  A physical exam.  Lab tests, which may include: ? Taking a sample of fluid from the back of your nose and throat (nasopharyngeal fluid), your nose, or your throat using a swab. ? A sample of mucus from your lungs (sputum). ? Blood tests.  Imaging tests,  which may include, X-rays, CT scan, or ultrasound. How is this treated? At present, there is no medicine to treat COVID-19. Medicines that treat other diseases are being used on a trial basis to see if they are effective against COVID-19. Your health care provider will talk with you about ways to treat your symptoms. For most people, the infection is mild and can be managed at home with rest, fluids, and over-the-counter medicines. Treatment for a serious infection usually takes places in a hospital intensive care unit (ICU). It may include one or more of the following treatments. These treatments are given until your symptoms improve.  Receiving fluids and medicines through an IV.  Supplemental oxygen. Extra oxygen is given through a tube in the nose, a face mask, or a hood.  Positioning you to lie on your stomach (prone position). This makes it easier for oxygen to get into the lungs.  Continuous positive airway pressure (CPAP) or bi-level positive airway pressure (BPAP) machine. This treatment uses mild air pressure to keep the airways open. A tube that is connected to a motor delivers oxygen to the body.  Ventilator. This treatment moves air into and out of the lungs by using a tube that is placed in your windpipe.  Tracheostomy. This is a procedure to create a hole in the neck so that a breathing tube can be inserted.  Extracorporeal membrane oxygenation (ECMO). This procedure gives the lungs a chance to recover by taking over the functions of the heart and lungs. It supplies oxygen to the body and removes carbon dioxide. Follow these instructions at home: Lifestyle  If you are sick, stay home except to get medical care. Your health care provider will tell you how long to stay home. Call your health care provider before you go for medical care.  Rest at home as told by your health care provider.  Do not use any products that contain nicotine or tobacco, such as cigarettes,  e-cigarettes, and chewing tobacco. If you need help quitting, ask your health care provider.  Return to your normal activities as told by your health care provider. Ask your health care provider what activities are safe for you. General instructions  Take over-the-counter and prescription medicines only as told by your health care provider.  Drink enough fluid to keep your urine pale yellow.  Keep all follow-up visits as told by your health care provider. This is important. How is this prevented?  There is no vaccine to help prevent COVID-19 infection. However, there are steps you can take to protect yourself and others from this virus. To protect yourself:   Do not travel to areas where COVID-19 is a risk. The areas where COVID-19 is reported change often. To identify high-risk areas and travel restrictions, check the CDC travel website: wwwnc.cdc.gov/travel/notices  If you live in, or must travel to, an area where COVID-19 is a risk, take precautions to avoid infection. ? Stay away from people who are sick. ? Wash your hands often with soap and water for 20 seconds. If soap and water   are not available, use an alcohol-based hand sanitizer. ? Avoid touching your mouth, face, eyes, or nose. ? Avoid going out in public, follow guidance from your state and local health authorities. ? If you must go out in public, wear a cloth face covering or face mask. Make sure your mask covers your nose and mouth. ? Avoid crowded indoor spaces. Stay at least 6 feet (2 meters) away from others. ? Disinfect objects and surfaces that are frequently touched every day. This may include:  Counters and tables.  Doorknobs and light switches.  Sinks and faucets.  Electronics, such as phones, remote controls, keyboards, computers, and tablets. To protect others: If you have symptoms of COVID-19, take steps to prevent the virus from spreading to others.  If you think you have a COVID-19 infection, contact  your health care provider right away. Tell your health care team that you think you may have a COVID-19 infection.  Stay home. Leave your house only to seek medical care. Do not use public transport.  Do not travel while you are sick.  Wash your hands often with soap and water for 20 seconds. If soap and water are not available, use alcohol-based hand sanitizer.  Stay away from other members of your household. Let healthy household members care for children and pets, if possible. If you have to care for children or pets, wash your hands often and wear a mask. If possible, stay in your own room, separate from others. Use a different bathroom.  Make sure that all people in your household wash their hands well and often.  Cough or sneeze into a tissue or your sleeve or elbow. Do not cough or sneeze into your hand or into the air.  Wear a cloth face covering or face mask. Make sure your mask covers your nose and mouth. Where to find more information  Centers for Disease Control and Prevention: www.cdc.gov/coronavirus/2019-ncov/index.html  World Health Organization: www.who.int/health-topics/coronavirus Contact a health care provider if:  You live in or have traveled to an area where COVID-19 is a risk and you have symptoms of the infection.  You have had contact with someone who has COVID-19 and you have symptoms of the infection. Get help right away if:  You have trouble breathing.  You have pain or pressure in your chest.  You have confusion.  You have bluish lips and fingernails.  You have difficulty waking from sleep.  You have symptoms that get worse. These symptoms may represent a serious problem that is an emergency. Do not wait to see if the symptoms will go away. Get medical help right away. Call your local emergency services (911 in the [place] Do not drive yourself to the hospital. Let the emergency medical personnel know if you think you have  COVID-19. Summary  COVID-19 is a respiratory infection that is caused by a virus. It is also known as coronavirus disease or novel coronavirus. It can cause serious infections, such as pneumonia, acute respiratory distress syndrome, acute respiratory failure, or sepsis.  The virus that causes COVID-19 is contagious. This means that it can spread from person to person through droplets from breathing, speaking, singing, coughing, or sneezing.  You are more likely to develop a serious illness if you are 50 years of age or older, have a weak immune system, live in a nursing home, or have chronic disease.  There is no medicine to treat COVID-19. Your health care provider will talk with you about ways to treat your symptoms.    Take steps to protect yourself and others from infection. Wash your hands often and disinfect objects and surfaces that are frequently touched every day. Stay away from people who are sick and wear a mask if you are sick. This information is not intended to replace advice given to you by your health care provider. Make sure you discuss any questions you have with your health care provider. Document Revised: 06/12/2019 Document Reviewed: 09/18/2018 Elsevier Patient Education  2020 [employer] Inc. What types of side effects do monoclonal antibody drugs cause?  Common side effects  In general, the more common side effects caused by monoclonal antibody drugs include: . Allergic reactions, such as hives or itching . Flu-like signs and symptoms, including chills, fatigue, fever, and muscle aches and pains . Nausea, vomiting . Diarrhea . Skin rashes . Low blood pressure   The CDC is recommending patients who receive monoclonal antibody treatments wait at least 90 days before being vaccinated.  Currently, there are no data on the safety and efficacy of mRNA COVID-19 vaccines in persons who received monoclonal antibodies or convalescent plasma as part of COVID-19 treatment. Based  on the estimated half-life of such therapies as well as evidence suggesting that reinfection is uncommon in the 90 days after initial infection, vaccination should be deferred for at least 90 days, as a precautionary measure until additional information becomes available, to avoid interference of the antibody treatment with vaccine-induced immune responses. 

## 2019-10-23 ENCOUNTER — Ambulatory Visit: Payer: Medicaid Other

## 2019-10-24 LAB — CULTURE, BLOOD (ROUTINE X 2)
Culture: NO GROWTH
Culture: NO GROWTH
Special Requests: ADEQUATE

## 2019-10-27 ENCOUNTER — Other Ambulatory Visit: Payer: Self-pay | Admitting: *Deleted

## 2019-10-27 ENCOUNTER — Ambulatory Visit (INDEPENDENT_AMBULATORY_CARE_PROVIDER_SITE_OTHER): Payer: 59 | Admitting: Family Medicine

## 2019-10-27 ENCOUNTER — Other Ambulatory Visit: Payer: Self-pay

## 2019-10-27 ENCOUNTER — Encounter: Payer: Self-pay | Admitting: Family Medicine

## 2019-10-27 VITALS — Ht 70.0 in

## 2019-10-27 DIAGNOSIS — M79662 Pain in left lower leg: Secondary | ICD-10-CM | POA: Diagnosis not present

## 2019-10-27 DIAGNOSIS — IMO0002 Reserved for concepts with insufficient information to code with codable children: Secondary | ICD-10-CM

## 2019-10-27 DIAGNOSIS — E1151 Type 2 diabetes mellitus with diabetic peripheral angiopathy without gangrene: Secondary | ICD-10-CM

## 2019-10-27 DIAGNOSIS — U071 COVID-19: Secondary | ICD-10-CM

## 2019-10-27 DIAGNOSIS — E1165 Type 2 diabetes mellitus with hyperglycemia: Secondary | ICD-10-CM

## 2019-10-27 DIAGNOSIS — J189 Pneumonia, unspecified organism: Secondary | ICD-10-CM | POA: Diagnosis not present

## 2019-10-27 DIAGNOSIS — K219 Gastro-esophageal reflux disease without esophagitis: Secondary | ICD-10-CM | POA: Diagnosis not present

## 2019-10-27 DIAGNOSIS — J1282 Pneumonia due to coronavirus disease 2019: Secondary | ICD-10-CM

## 2019-10-27 DIAGNOSIS — F418 Other specified anxiety disorders: Secondary | ICD-10-CM

## 2019-10-27 HISTORY — DX: Pneumonia due to coronavirus disease 2019: J12.82

## 2019-10-27 HISTORY — DX: COVID-19: U07.1

## 2019-10-27 MED ORDER — PANTOPRAZOLE SODIUM 40 MG PO TBEC: DELAYED_RELEASE_TABLET | ORAL | 0 refills | Status: DC

## 2019-10-27 MED ORDER — PANTOPRAZOLE SODIUM 40 MG PO TBEC: DELAYED_RELEASE_TABLET | ORAL | 2 refills | Status: DC

## 2019-10-27 NOTE — Progress Notes (Signed)
Virtual Visit via telephone  Note  I connected with Kimberly [name] on 10/27/19 at 11:20 AM EST by a  telemedicine application and verified that I am speaking with the correct person using two identifiers.  Location: Patient: home with son  Provider: office    I discussed the limitations of evaluation and management by telemedicine and the availability of in person appointments. The patient expressed understanding and agreed to proceed.  History of Present Illness: Pt is home recuperation from + covid and b/l pneumonia   She is feeling much better but is still very tired and weak.  No more fevers,   + cough p Pt was in er 2/22 and was d/c home with abx  bamlavivimab 700 mg IV given 2/24     Observations/Objective: Vitals:   10/27/19 1116  SpO2: 94%   Pt is in nad   Assessment and Plan:  1. Pain of left calf Check [place]  D dimer was elevated in er  - US Venous Img Lower Unilateral Left (DVT); Future  2. Pneumonia of both lungs due to infectious organism, unspecified part of lung +covid Improving slowly Finish abx con't quarantine Recheck xray in 2-3 weeks  - DG Chest 2 View; Future  3. Gastroesophageal reflux disease, unspecified whether esophagitis present Refill protonix  - pantoprazole (PROTONIX) 40 MG tablet; TAKE 1 TABLET(40 MG) BY MOUTH TWICE DAILY.  Needs ov before any more refills  Dispense: 180 tablet; Refill: 2  4. DM (diabetes mellitus) type II uncontrolled, periph vascular disorder ([place]) Per endo  5. Depression with anxiety Stable con't meds   6. Pneumonia due to COVID-19 virus Improving -- see above   ent plan with the patient. The patient was provided an opportunity to ask questions and all were answered. The patient agreed with the plan and demonstrated an understanding of the instructions.   The patient was advised to call back or seek an in-person evaluation if the symptoms worsen or if the condition fails to improve as anticipated.  I provided 30  minutes of non-face-to-face time during this encounter.   [name], DO

## 2019-10-27 NOTE — Assessment & Plan Note (Signed)
Per endo °

## 2019-10-27 NOTE — Assessment & Plan Note (Signed)
Stable con't meds 

## 2019-11-02 ENCOUNTER — Encounter: Payer: Self-pay | Admitting: Family Medicine

## 2019-11-02 NOTE — Telephone Encounter (Signed)
Note is in mychart

## 2019-11-06 ENCOUNTER — Other Ambulatory Visit: Payer: Self-pay

## 2019-11-06 ENCOUNTER — Ambulatory Visit (HOSPITAL_BASED_OUTPATIENT_CLINIC_OR_DEPARTMENT_OTHER)
Admission: RE | Admit: 2019-11-06 | Discharge: 2019-11-06 | Disposition: A | Discharge status: Home / Self Care | Payer: 59 | Source: Ambulatory Visit | Attending: Family Medicine | Admitting: Family Medicine

## 2019-11-06 ENCOUNTER — Telehealth: Payer: Self-pay | Admitting: Internal Medicine

## 2019-11-06 DIAGNOSIS — J189 Pneumonia, unspecified organism: Secondary | ICD-10-CM | POA: Diagnosis present

## 2019-11-06 DIAGNOSIS — M79662 Pain in left lower leg: Secondary | ICD-10-CM | POA: Insufficient documentation

## 2019-11-06 NOTE — Telephone Encounter (Signed)
MEDICATION: LANTUS SOLOSTAR 100 UNIT/ML Solostar Pen  PHARMACY:   [phone] - [place], [place] Phone:  [phone]  Fax:  [phone]      IS THIS A 90 DAY SUPPLY : ?  IS PATIENT OUT OF MEDICATION: No  IF NOT; HOW MUCH IS LEFT: Approx. 1 week supply  LAST APPOINTMENT DATE: @7 /18/2019  NEXT APPOINTMENT DATE:@4 /27/2021  DO WE HAVE YOUR PERMISSION TO LEAVE A DETAILED MESSAGE: Yes on ph# [phone]/ Text # [phone]  OTHER COMMENTS:    **Let patient know to contact pharmacy at the end of the day to make sure medication is ready. **  ** Please notify patient to allow 48-72 hours to process**  **Encourage patient to contact the pharmacy for refills or they can request refills through [facility]**

## 2019-11-09 ENCOUNTER — Telehealth: Payer: Self-pay | Admitting: *Deleted

## 2019-11-09 MED ORDER — LANTUS SOLOSTAR 100 UNIT/ML ~~LOC~~ SOPN: 50.0000 [IU] | PEN_INJECTOR | Freq: Two times a day (BID) | SUBCUTANEOUS | 1 refills | Status: DC

## 2019-11-09 NOTE — Telephone Encounter (Signed)
Who Is Calling Patient / Member / Family / Caregiver Call Type Triage / Clinical Relationship To Patient Self Return Phone Number [phone] (Primary) Chief Complaint Health information question (non symptomatic) Reason for Call Symptomatic / Request for Health Information Initial Comment Caller reports she tested positive for Covid in February, but she has questions regarding her current xray and report. Caller reports she has copy of the xray.   Comments User: Raford Pitcher, RN Date/Time Eilene Ghazi Time): 11/07/2019 4:38:59 PM Caller stated that she wants to know what her CXR report means. I explained that we are not able to interpret results as nurses, and to call back to the office on Monday. User: Raford Pitcher, RN Date/Time Eilene Ghazi Time): 11/07/2019 4:39:20 PM Denies new or worsening s/s.

## 2019-11-09 NOTE — Telephone Encounter (Signed)
Patient called in to see if Dr, Etter Sjogren could give her a call back to discuss the Lung X Ray results. Please call the patient back at [phone] thanks,

## 2019-11-09 NOTE — Telephone Encounter (Signed)
Normal xray=== abnormality that was on original xray has resolved

## 2019-11-09 NOTE — Telephone Encounter (Signed)
Pt would clarification on the chest x-ray. Please advise what Resolution of covid airspace disease means.

## 2019-11-10 ENCOUNTER — Encounter: Payer: Self-pay | Admitting: Family Medicine

## 2019-11-12 ENCOUNTER — Other Ambulatory Visit: Payer: Self-pay

## 2019-11-12 ENCOUNTER — Emergency Department (HOSPITAL_BASED_OUTPATIENT_CLINIC_OR_DEPARTMENT_OTHER): Payer: 59

## 2019-11-12 ENCOUNTER — Emergency Department (HOSPITAL_BASED_OUTPATIENT_CLINIC_OR_DEPARTMENT_OTHER)
Admission: EM | Admit: 2019-11-12 | Discharge: 2019-11-12 | Disposition: A | Discharge status: Home / Self Care | Payer: 59 | Attending: Emergency Medicine | Admitting: Emergency Medicine

## 2019-11-12 ENCOUNTER — Encounter: Payer: Self-pay | Admitting: Family Medicine

## 2019-11-12 ENCOUNTER — Encounter (HOSPITAL_BASED_OUTPATIENT_CLINIC_OR_DEPARTMENT_OTHER): Payer: Self-pay | Admitting: Oncology

## 2019-11-12 DIAGNOSIS — K5900 Constipation, unspecified: Secondary | ICD-10-CM | POA: Diagnosis not present

## 2019-11-12 DIAGNOSIS — R109 Unspecified abdominal pain: Secondary | ICD-10-CM

## 2019-11-12 DIAGNOSIS — Z79899 Other long term (current) drug therapy: Secondary | ICD-10-CM | POA: Diagnosis not present

## 2019-11-12 DIAGNOSIS — Z794 Long term (current) use of insulin: Secondary | ICD-10-CM | POA: Insufficient documentation

## 2019-11-12 DIAGNOSIS — Z87891 Personal history of nicotine dependence: Secondary | ICD-10-CM | POA: Insufficient documentation

## 2019-11-12 DIAGNOSIS — E1151 Type 2 diabetes mellitus with diabetic peripheral angiopathy without gangrene: Secondary | ICD-10-CM | POA: Diagnosis not present

## 2019-11-12 DIAGNOSIS — R1032 Left lower quadrant pain: Secondary | ICD-10-CM | POA: Diagnosis not present

## 2019-11-12 LAB — CBC WITH DIFFERENTIAL/PLATELET
Abs Immature Granulocytes: 0.01 10*3/uL (ref 0.00–0.07)
Basophils Absolute: 0 10*3/uL (ref 0.0–0.1)
Basophils Relative: 1 %
Eosinophils Absolute: 0.1 10*3/uL (ref 0.0–0.5)
Eosinophils Relative: 4 %
HCT: 41.1 % (ref 36.0–46.0)
Hemoglobin: 13.2 g/dL (ref 12.0–15.0)
Immature Granulocytes: 0 %
Lymphocytes Relative: 35 %
Lymphs Abs: 1.2 10*3/uL (ref 0.7–4.0)
MCH: 27.4 pg (ref 26.0–34.0)
MCHC: 32.1 g/dL (ref 30.0–36.0)
MCV: 85.4 fL (ref 80.0–100.0)
Monocytes Absolute: 0.3 10*3/uL (ref 0.1–1.0)
Monocytes Relative: 9 %
Neutro Abs: 1.7 10*3/uL (ref 1.7–7.7)
Neutrophils Relative %: 51 %
Platelets: 115 10*3/uL — ABNORMAL LOW (ref 150–400)
RBC: 4.81 MIL/uL (ref 3.87–5.11)
RDW: 15.3 % (ref 11.5–15.5)
WBC: 3.4 10*3/uL — ABNORMAL LOW (ref 4.0–10.5)
nRBC: 0 % (ref 0.0–0.2)

## 2019-11-12 LAB — BASIC METABOLIC PANEL
Anion gap: 10 (ref 5–15)
BUN: 12 mg/dL (ref 6–20)
CO2: 27 mmol/L (ref 22–32)
Calcium: 9.6 mg/dL (ref 8.9–10.3)
Chloride: 101 mmol/L (ref 98–111)
Creatinine, Ser: 0.77 mg/dL (ref 0.44–1.00)
GFR calc Af Amer: >60 mL/min (ref >60)
GFR calc non Af Amer: >60 mL/min (ref >60)
Glucose, Bld: 204 mg/dL — ABNORMAL HIGH (ref 70–99)
Potassium: 4.2 mmol/L (ref 3.5–5.1)
Sodium: 138 mmol/L (ref 135–145)

## 2019-11-12 LAB — URINALYSIS, ROUTINE W REFLEX MICROSCOPIC
Bilirubin Urine: NEGATIVE
Glucose, UA: NEGATIVE mg/dL
Hgb urine dipstick: NEGATIVE
Ketones, ur: NEGATIVE mg/dL
Leukocytes,Ua: NEGATIVE
Nitrite: NEGATIVE
Protein, ur: NEGATIVE mg/dL
Specific Gravity, Urine: 1.01 (ref 1.005–1.030)
pH: 6.5 (ref 5.0–8.0)

## 2019-11-12 MED ORDER — KETOROLAC TROMETHAMINE 30 MG/ML IJ SOLN
30.0000 mg | Freq: Once | INTRAMUSCULAR | Status: DC
  Filled 2019-11-12: qty 1

## 2019-11-12 MED ORDER — ONDANSETRON 4 MG PO TBDP: ORAL_TABLET | ORAL | 0 refills | Status: DC

## 2019-11-12 NOTE — Telephone Encounter (Signed)
[place] sent pt a Mychart message

## 2019-11-12 NOTE — Telephone Encounter (Signed)
They did do a ct for stones and it was neg A clot would not show but that usually causes sob and chest pain  She prob needs to come in tomorrow

## 2019-11-12 NOTE — ED Provider Notes (Addendum)
[facility] EMERGENCY DEPARTMENT Provider Note   CSN: [id number] Arrival date & time: 11/12/19  0548     History Chief Complaint  Patient presents with  . Flank Pain    Kimberly [name] is a 59 y.o. female.  The history is provided by the patient.  Flank Pain This is a new problem. The current episode started 12 to 24 hours ago. The problem occurs constantly. The problem has not changed since onset.Pertinent negatives include no chest pain, no abdominal pain, no headaches and no shortness of breath. Nothing aggravates the symptoms. Nothing relieves the symptoms. She has tried nothing for the symptoms. The treatment provided no relief.  Patient has had several days of L flank pain.  She reports that she thought this was an IBS flare but it got worse and changed last night.  No urinary symptoms, no hematurua.  No radiation.  No nausea, no vomiting.  No f/c/r. Patient informed me that she had diarrhea post covid but recently her bowel movements had changes to the "other direction".       Past Medical History:  Diagnosis Date  . Anxiety    takes Ativan as needed  . Bronchitis    hx of;last time > 17yrago  . Cancer ([facility]    part of left lower lung removed- lung ca 2013  . Colitis   . Colon polyps    hx of  . Complication of anesthesia    gas and MAC procedures  . Depression    takes Wellbutrin daily  . Diabetes mellitus    Novolog and Levemir daily  . [place] 2[place]   . Eczema   . GERD (gastroesophageal reflux disease)    takes Protonix nightly  . Headache(784.0)    stress HA frequently  . IBS (irritable bowel syndrome)   . Lung mass    left upper lobe  . Pneumonia    walking pneumonia in 2007  . PONV (postoperative nausea and vomiting)   . Shortness of breath    certain times of the year  . Stress incontinence     Patient Active Problem List   Diagnosis Date Noted  . Pneumonia due to COVID-19 virus 10/27/2019  . Situational  anxiety 04/10/2018  . Hyperlipidemia LDL goal <70 04/10/2018  . DM (diabetes mellitus) type II uncontrolled, periph vascular disorder ([place] 04/10/2018  . Viral upper respiratory tract infection 04/10/2018  . Tennis elbow 04/10/2018  . Grief at loss of child 10/22/2017  . Pain in right wrist 10/22/2017  . Chest pain 10/22/2017  . Gastric reflux with aspiration 02/15/2016  . Nodule of left lung 10/29/2011  . ACUTE BRONCHOSPASM 09/08/2009  . COUGH 09/08/2009  . ACUTE SINUSITIS, UNSPECIFIED 05/23/2009  . CONTACT DERMATITIS&OTHER ECZEMA DUE TO PLANTS 06/29/2008  . CANDIDIASIS, VAGINAL 05/05/2008  . ACUTE BRONCHITIS 04/29/2008  . VARICOSE VEINS, LOWER EXTREMITIES 11/14/2007  . HIP PAIN, LEFT 11/14/2007  . MYALGIA 11/14/2007  . FATIGUE 11/14/2007  . Depression with anxiety 07/04/2007  . Poorly controlled type 2 diabetes mellitus with peripheral neuropathy [place] 01/06/2007  . MYOCLONUS 01/06/2007  . Common migraine with intractable migraine 01/06/2007  . FATTY LIVER DISEASE 01/06/2007  . GESTATIONAL DIABETES 01/06/2007  . TOBACCO USE, QUIT 01/06/2007    Past Surgical History:  Procedure Laterality Date  . BIOPSY THYROID     pt has thyroid polyps another scan in Mar 2013  . CAESAREAN SECTION  96/98/2000  . colonosocpy    . Rectal  fistula  1993  . THORACOTOMY  10/26/2011   Procedure: THORACOTOMY MAJOR;  Surgeon: Gaye Pollack, MD;  Location: [facility] OR;  Service: Thoracic;  Laterality: Left;  . UTERINE FIBROID SURGERY     late 30's early 66's     OB History    Gravida  5   Para  4   Term      Preterm      AB      Living        SAB      TAB      Ectopic      Multiple      Live Births              Family History  Problem Relation Age of Onset  . Hyperlipidemia Mother   . Diabetes Mother   . Cancer Mother        kidney   . Cancer Father        liver  . Breast cancer Maternal Aunt   . Heart disease Maternal Grandmother   . Cancer Maternal Grandfather         kidney   . Heart disease Maternal Grandfather   . Hyperlipidemia Maternal Grandfather   . Breast cancer Other   . Anesthesia problems Sister   . Migraines Sister   . Hypotension Neg Hx   . Malignant hyperthermia Neg Hx   . Pseudochol deficiency Neg Hx     Social History   Tobacco Use  . Smoking status: Former Smoker    Types: Cigarettes    Quit date: 08/27/1982    Years since quitting: 37.2  . Smokeless tobacco: Never Used  Substance Use Topics  . Alcohol use: No  . Drug use: No    Home Medications Prior to Admission medications   Medication Sig Start Date End Date Taking? Authorizing Provider  ACCU-CHEK FASTCLIX LANCETS [place]  03/17/16   [provider]  ACCU-CHEK SMARTVIEW test strip TEST 4 TO 5 TIMES PER DAY 02/02/19   Philemon Kingdom, MD  albuterol (PROVENTIL) (2.5 MG/3ML) 0.083% nebulizer solution Take 3 mLs (2.5 mg total) by nebulization every 6 (six) hours as needed for wheezing or shortness of breath. 08/07/18   Saguier, Percell Miller, PA-C  buPROPion (WELLBUTRIN XL) 150 MG 24 hr tablet TAKE 1 TABLET(150 MG) BY MOUTH EVERY EVENING.  Need ov before any more refills. Patient taking differently: Take 150 mg by mouth every evening.  04/08/19   Roma Schanz R, DO  insulin aspart (NOVOLOG FLEXPEN) 100 UNIT/ML injection Inject 1-20 Units into the skin 3 (three) times daily before meals. Sliding scale    [provider]  LANTUS SOLOSTAR 100 UNIT/ML Solostar Pen Inject 50 Units into the skin in the morning and at bedtime. 11/09/19   Philemon Kingdom, MD  LORazepam (ATIVAN) 0.5 MG tablet Take 1 tablet (0.5 mg total) by mouth 2 (two) times daily as needed. for anxiety 04/15/19   Carollee Herter, Alferd Apa, DO  metFORMIN (GLUCOPHAGE-XR) 500 MG 24 hr tablet Take 1,000 mg by mouth 2 (two) times daily. 09/25/19   [provider]  pantoprazole (PROTONIX) 40 MG tablet TAKE 1 TABLET(40 MG) BY MOUTH TWICE DAILY.  Needs ov before any more refills 10/27/19   Roma Schanz  R, DO    Allergies    Codeine, Diflucan [fluconazole], Metoclopramide hcl, Penicillins, and Prednisone  Review of Systems   Review of Systems  Constitutional: Negative for fever.  HENT: Negative for congestion.  Eyes: Negative for visual disturbance.  Respiratory: Negative for shortness of breath.   Cardiovascular: Negative for chest pain.  Gastrointestinal: Negative for abdominal pain and vomiting.  Genitourinary: Positive for flank pain. Negative for dysuria, frequency and hematuria.  Musculoskeletal: Negative for arthralgias.  Skin: Negative for rash.  Neurological: Negative for headaches.  Psychiatric/Behavioral: Negative for agitation.  All other systems reviewed and are negative.   Physical Exam Updated Vital Signs BP (!) 193/91 (BP Location: Right Arm)   Pulse 84   Temp 98 F (36.7 C) (Oral)   Resp 20   Ht _0  (1.778 m)   Wt 123.4 kg   SpO2 98%   BMI 39.03 kg/m   Physical Exam Vitals and nursing note reviewed.  Constitutional:      General: She is not in acute distress.    Appearance: Normal appearance.  HENT:     Head: Normocephalic and atraumatic.     Nose: Nose normal.  Eyes:     Conjunctiva/sclera: Conjunctivae normal.     Pupils: Pupils are equal, round, and reactive to light.  Cardiovascular:     Rate and Rhythm: Normal rate and regular rhythm.     Pulses: Normal pulses.     Heart sounds: Normal heart sounds.  Pulmonary:     Effort: Pulmonary effort is normal.     Breath sounds: Normal breath sounds.  Abdominal:     General: Abdomen is flat. Bowel sounds are normal.     Tenderness: There is no abdominal tenderness. There is no guarding or rebound.  Musculoskeletal:        General: Normal range of motion.     Cervical back: Normal range of motion and neck supple.     Comments: No tenderness to palpation of the back nor the flank  Skin:    General: Skin is warm and dry.     Capillary Refill: Capillary refill takes less than 2 seconds.    Neurological:     General: No focal deficit present.     Mental Status: She is alert and oriented to person, place, and time.     Deep Tendon Reflexes: Reflexes normal.  Psychiatric:        Mood and Affect: Mood normal.        Behavior: Behavior normal.     ED Results / Procedures / Treatments   Labs (all labs ordered are listed, but only abnormal results are displayed) Results for orders placed or performed during the hospital encounter of 11/12/19  CBC with Differential/Platelet  Result Value Ref Range   WBC 3.4 (L) 4.0 - 10.5 K/uL   RBC 4.81 3.87 - 5.11 MIL/uL   Hemoglobin 13.2 12.0 - 15.0 g/dL   HCT 41.1 36.0 - 46.0 %   MCV 85.4 80.0 - 100.0 fL   MCH 27.4 26.0 - 34.0 pg   MCHC 32.1 30.0 - 36.0 g/dL   RDW 15.3 11.5 - 15.5 %   Platelets 115 (L) 150 - 400 K/uL   nRBC 0.0 0.0 - 0.2 %   Neutrophils Relative % 51 %   Neutro Abs 1.7 1.7 - 7.7 K/uL   Lymphocytes Relative 35 %   Lymphs Abs 1.2 0.7 - 4.0 K/uL   Monocytes Relative 9 %   Monocytes Absolute 0.3 0.1 - 1.0 K/uL   Eosinophils Relative 4 %   Eosinophils Absolute 0.1 0.0 - 0.5 K/uL   Basophils Relative 1 %   Basophils Absolute 0.0 0.0 - 0.1 K/uL   WBC  Morphology MORPHOLOGY UNREMARKABLE    RBC Morphology MORPHOLOGY UNREMARKABLE    Immature Granulocytes 0 %   Abs Immature Granulocytes 0.01 0.00 - 0.07 K/uL   Hypogranular Platelets PRESENT   Basic metabolic panel  Result Value Ref Range   Sodium 138 135 - 145 mmol/L   Potassium 4.2 3.5 - 5.1 mmol/L   Chloride 101 98 - 111 mmol/L   CO2 27 22 - 32 mmol/L   Glucose, Bld 204 (H) 70 - 99 mg/dL   BUN 12 6 - 20 mg/dL   Creatinine, Ser 0.77 0.44 - 1.00 mg/dL   Calcium 9.6 8.9 - 10.3 mg/dL   GFR calc non Af Amer >60 >60 mL/min   GFR calc Af Amer >60 >60 mL/min   Anion gap 10 5 - 15  Urinalysis, Routine w reflex microscopic  Result Value Ref Range   Color, Urine YELLOW YELLOW   APPearance CLEAR CLEAR   Specific Gravity, Urine 1.010 1.005 - 1.030   pH 6.5 5.0 - 8.0    Glucose, UA NEGATIVE NEGATIVE mg/dL   Hgb urine dipstick NEGATIVE NEGATIVE   Bilirubin Urine NEGATIVE NEGATIVE   Ketones, ur NEGATIVE NEGATIVE mg/dL   Protein, ur NEGATIVE NEGATIVE mg/dL   Nitrite NEGATIVE NEGATIVE   Leukocytes,Ua NEGATIVE NEGATIVE   DG Chest 2 View  Result Date: 11/07/2019 CLINICAL DATA:  Pneumonia, COVID in February EXAM: CHEST - 2 VIEW COMPARISON:  10/19/2019 FINDINGS: The heart size and mediastinal contours are within normal limits. Interval resolution of previously seen heterogeneous bibasilar airspace opacity, now with linear bandlike scarring or atelectasis of the left lung base. The visualized skeletal structures are unremarkable. IMPRESSION: Interval resolution of previously seen heterogeneous bibasilar airspace opacity, consistent with resolution of COVID airspace disease. Bandlike scarring or atelectasis of the left lung base. Electronically Signed   By: Eddie Candle M.D.   On: 11/07/2019 13:45   CT Angio Chest PE W and/or Wo Contrast  Result Date: 10/19/2019 CLINICAL DATA:  Increasing shortness of breath. Positive D-dimer. COVID-19. Previous left lower lobe resection due to lung cancer. EXAM: CT ANGIOGRAPHY CHEST WITH CONTRAST TECHNIQUE: Multidetector CT imaging of the chest was performed using the standard protocol during bolus administration of intravenous contrast. Multiplanar CT image reconstructions and MIPs were obtained to evaluate the vascular anatomy. CONTRAST:  80 cc Omnipaque 350 COMPARISON:  Chest x-ray dated 10/19/2019 and CT scan of the chest dated 07/22/2019 FINDINGS: Cardiovascular: Satisfactory opacification of the pulmonary arteries to the segmental level. No evidence of pulmonary embolism. Normal heart size. No pericardial effusion. Mediastinum/Nodes: There are new enlarged bilateral hilar lymph nodes measuring 16 mm on the left and 14 mm on the right. New small precarinal lymph node. New small node adjacent to the left main pulmonary artery.  Lungs/Pleura: The patient has extensive patchy bilateral pulmonary infiltrates in all lobes but most prominent in the lower lobes. Tiny bilateral pleural effusions. Upper Abdomen: Hepatosplenomegaly. Musculoskeletal: No chest wall abnormality. No acute or significant osseous findings. Review of the MIP images confirms the above findings. IMPRESSION: 1. No pulmonary emboli. 2. Extensive patchy bilateral pulmonary infiltrates consistent with pneumonia. 3. New bilateral hilar and mediastinal adenopathy. 4. Hepatosplenomegaly. Electronically Signed   By: Lorriane Shire M.D.   On: 10/19/2019 08:25   US Venous Img Lower Unilateral Left (DVT)  Result Date: 11/06/2019 CLINICAL DATA:  Left calf pain. EXAM: Left LOWER EXTREMITY VENOUS DOPPLER ULTRASOUND TECHNIQUE: Gray-scale sonography with compression, as well as color and duplex ultrasound, were performed to evaluate the deep  venous system(s) from the level of the common femoral vein through the popliteal and proximal calf veins. COMPARISON:  None. FINDINGS: VENOUS Normal compressibility of the common femoral, superficial femoral, and popliteal veins, as well as the visualized calf veins. Visualized portions of profunda femoral vein and great saphenous vein unremarkable. No filling defects to suggest DVT on grayscale or color Doppler imaging. Doppler waveforms show normal direction of venous flow, normal respiratory phasicity and response to augmentation. Limited views of the contralateral common femoral vein are unremarkable. OTHER None. Limitations: none IMPRESSION: No femoropopliteal DVT nor evidence of DVT within the visualized calf veins. If clinical symptoms are inconsistent or if there are persistent or worsening symptoms, further imaging (possibly involving the iliac veins) may be warranted. Electronically Signed   By: Marijo Conception M.D.   On: 11/06/2019 14:50   DG Chest Portable 1 View  Result Date: 10/19/2019 CLINICAL DATA:  Shortness of breath.  COVID  positive. EXAM: PORTABLE CHEST 1 VIEW COMPARISON:  08/07/2018 FINDINGS: Patchy bilateral airspace disease consistent with history of COVID positivity. Lung volumes are low. Normal heart size and stable mediastinal contours. No visible effusion or pneumothorax IMPRESSION: Patchy bilateral pneumonia correlating with history of COVID 19 positivity. Electronically Signed   By: Monte Fantasia M.D.   On: 10/19/2019 05:34   CT Renal Stone Study  Result Date: 11/12/2019 CLINICAL DATA:  Left side flank pain EXAM: CT ABDOMEN AND PELVIS WITHOUT CONTRAST TECHNIQUE: Multidetector CT imaging of the abdomen and pelvis was performed following the standard protocol without IV contrast. COMPARISON:  08/25/2011 FINDINGS: Lower chest: Scarring in the lung bases.  No acute abnormality. Hepatobiliary: Layering gallstones within the gallbladder. Nodular contours of the liver compatible with cirrhosis. No visible focal abnormality. Pancreas: No focal abnormality or ductal dilatation. Spleen: Splenomegaly with craniocaudal length 16.7 cm. No focal abnormality. Adrenals/Urinary Tract: No adrenal abnormality. No focal renal abnormality. No stones or hydronephrosis. Urinary bladder is unremarkable. Stomach/Bowel: Moderate stool burden. Stomach, large and small bowel grossly unremarkable. Vascular/Lymphatic: No evidence of aneurysm or adenopathy. Reproductive: Uterus and adnexa unremarkable.  No mass. Other: No free fluid or free air. Musculoskeletal: No acute bony abnormality. IMPRESSION: No evidence of renal or ureteral stones.  No hydronephrosis. Nodular contours of the liver compatible with cirrhosis. Associated splenomegaly. Cholelithiasis Moderate stool burden. Electronically Signed   By: Rolm Baptise M.D.   On: 11/12/2019 08:07    Radiology No results found.  Procedures Procedures (including critical care time)  Medications Ordered in ED Medications  ketorolac (TORADOL) 30 MG/ML injection 30 mg (has no administration in  time range)    ED Course  I have reviewed the triage vital signs and the nursing notes.  Pertinent labs & imaging results that were available during my care of the patient were reviewed by me and considered in my medical decision making (see chart for details).    Well appearing, appears very comfortable in the room. Patient's exam and vitals are reassuring. No stones on CT, some constipation seen.  Will start Miralax for stool burden seen on CT scan, patient was informed of this finding.  The patient was informed of constipation seen on CT scan, this is an objective finding.  She was informed that the kidneys, ureters were normal.  The pain could be related to overlying bowel.  While it could be MSK in nature there was no spasm in the area and no tenderness to palpation.  Urine was unremarkable and labs were normal as well.  Alternate  tylenol and motrin for pain and follow up with your PMD for ongoing care.    Kimberly [name] was evaluated in Emergency Department on 11/12/2019 for the symptoms described in the history of present illness. She was evaluated in the context of the global COVID-19 pandemic, which necessitated consideration that the patient might be at risk for infection with the SARS-CoV-2 virus that causes COVID-19. Institutional protocols and algorithms that pertain to the evaluation of patients at risk for COVID-19 are in a state of rapid change based on information released by regulatory bodies including the CDC and federal and state organizations. These policies and algorithms were followed during the patient's care in the ED.  Final Clinical Impression(s) / ED Diagnoses Return for weakness, numbness, changes in vision or speech, fevers >100.4 unrelieved by medication, shortness of breath, intractable vomiting, or diarrhea, abdominal pain, Inability to tolerate liquids or food, cough, altered mental status or any concerns. No signs of systemic illness or infection. The patient is  nontoxic-appearing on exam and vital signs are within normal limits.   I have reviewed the triage vital signs and the nursing notes. Pertinent labs &imaging results that were available during my care of the patient were reviewed by me and considered in my medical decision making (see chart for details).  After history, exam, and medical workup I feel the patient has been appropriately medically screened and is safe for discharge home. Pertinent diagnoses were discussed with the patient. Patient was given strict return precautions.         Nyree Yonker, MD 11/15/19 [phone]

## 2019-11-12 NOTE — ED Triage Notes (Signed)
Pt c/o left sided flank pain that started last night.  Took Zofran and 200 mg ibuprofen approx 1 hour ago w/o relief.

## 2019-11-15 ENCOUNTER — Encounter (HOSPITAL_BASED_OUTPATIENT_CLINIC_OR_DEPARTMENT_OTHER): Payer: Self-pay | Admitting: Emergency Medicine

## 2019-11-16 ENCOUNTER — Other Ambulatory Visit: Payer: Self-pay

## 2019-11-17 ENCOUNTER — Ambulatory Visit: Payer: 59 | Admitting: Family Medicine

## 2019-11-17 ENCOUNTER — Encounter: Payer: Self-pay | Admitting: Family Medicine

## 2019-11-17 ENCOUNTER — Other Ambulatory Visit: Payer: Self-pay | Admitting: Family Medicine

## 2019-11-17 ENCOUNTER — Other Ambulatory Visit: Payer: Self-pay

## 2019-11-17 VITALS — BP 148/80 | HR 102 | Temp 97.1°F | Resp 18 | Ht 70.0 in | Wt 267.4 lb

## 2019-11-17 DIAGNOSIS — R109 Unspecified abdominal pain: Secondary | ICD-10-CM

## 2019-11-17 DIAGNOSIS — D729 Disorder of white blood cells, unspecified: Secondary | ICD-10-CM

## 2019-11-17 DIAGNOSIS — Z8616 Personal history of COVID-19: Secondary | ICD-10-CM | POA: Diagnosis not present

## 2019-11-17 DIAGNOSIS — R161 Splenomegaly, not elsewhere classified: Secondary | ICD-10-CM

## 2019-11-17 DIAGNOSIS — D691 Qualitative platelet defects: Secondary | ICD-10-CM

## 2019-11-17 HISTORY — DX: Disorder of white blood cells, unspecified: D72.9

## 2019-11-17 HISTORY — DX: Unspecified abdominal pain: R10.9

## 2019-11-17 LAB — CBC WITH DIFFERENTIAL/PLATELET
Basophils Absolute: 0 10*3/uL (ref 0.0–0.1)
Basophils Relative: 0.6 % (ref 0.0–3.0)
Eosinophils Absolute: 0.1 10*3/uL (ref 0.0–0.7)
Eosinophils Relative: 2.6 % (ref 0.0–5.0)
HCT: 42.1 % (ref 36.0–46.0)
Hemoglobin: 14.1 g/dL (ref 12.0–15.0)
Lymphocytes Relative: 30.9 % (ref 12.0–46.0)
Lymphs Abs: 1.1 10*3/uL (ref 0.7–4.0)
MCHC: 33.6 g/dL (ref 30.0–36.0)
MCV: 84.4 fl (ref 78.0–100.0)
Monocytes Absolute: 0.3 10*3/uL (ref 0.1–1.0)
Monocytes Relative: 7.6 % (ref 3.0–12.0)
Neutro Abs: 2 10*3/uL (ref 1.4–7.7)
Neutrophils Relative %: 58.3 % (ref 43.0–77.0)
Platelets: 105 10*3/uL — ABNORMAL LOW (ref 150.0–400.0)
RBC: 4.99 Mil/uL (ref 3.87–5.11)
RDW: 17.3 % — ABNORMAL HIGH (ref 11.5–15.5)
WBC: 3.5 10*3/uL — ABNORMAL LOW (ref 4.0–10.5)

## 2019-11-17 MED ORDER — TRAMADOL HCL 50 MG PO TABS: 50.0000 mg | ORAL_TABLET | Freq: Three times a day (TID) | ORAL | 0 refills | Status: AC | PRN

## 2019-11-17 NOTE — Assessment & Plan Note (Signed)
Recheck labs If platelets lower or same consider surgery referral / hematology

## 2019-11-17 NOTE — Patient Instructions (Signed)
Enlarged Spleen  An enlarged spleen (splenomegaly) is when the spleen is larger than normal. The spleen is an organ that is located in the upper left area of the abdomen, just under the ribs. The spleen is like a storage unit for red blood cells, and it also works to filter and clean the blood. It destroys cells that are damaged or worn out. The spleen is also important for fighting disease. This condition is usually noticed when the spleen is almost twice its normal size. An enlarged spleen is usually a sign of another health problem. What are the causes? This condition may be caused by:  Mononucleosis and other viral infections.  Infection with certain bacteria or parasites.  Liver failure (cirrhosis) and other liver diseases.  Blood diseases, such as hemolytic anemia.  Blood cancers, such as leukemia or Hodgkin's disease. Other causes include:  Tumors and fluid-filled sacs (cysts).  Metabolic disorders, such as Gaucher disease or Niemann-Pick disease.  Pressure or blood clots in the veins of the spleen.  Connective tissue disorders, such as lupus or rheumatoid arthritis. What are the signs or symptoms? Symptoms of this condition include:  Pain in the upper left part of the abdomen. The pain may spread to the left shoulder or get worse when you take a breath.  Feeling full without eating or after eating only a small amount.  Feeling tired.  Chronic infections.  Bleeding or bruising easily. In some cases, there are no symptoms. How is this diagnosed? This condition is diagnosed based on:  A physical exam. Your health care provider will feel the left upper part of your abdomen.  Tests, such as: ? Blood tests to check red and white blood cells and other proteins and enzymes. ? A biopsy. During a biopsy, a tissue sample of the liver or bone marrow may be removed and looked at under a microscope. A biopsy may be done if there is concern that the liver or bone marrow is  causing the enlarged spleen. ? An abdominal ultrasound. ? A CT scan. ? An MRI. How is this treated? Treatment for this condition depends on the cause. Treatment aims to:  Manage the conditions that cause enlargement of the spleen.  Reduce the size of the spleen. Treatment may include:  Medicines to treat infection or disease.  Radiation therapy.  Blood transfusions. If these treatments do not help or if the cause cannot be found, surgery to remove the spleen (splenectomy) may be recommended. After surgery, you may need to have vaccinations or take antibiotics to prevent infections. Follow these instructions at home: Medicines  Take over-the-counter and prescription medicines only as told by your health care provider.  If you were prescribed an antibiotic medicine, take it as told by your health care provider. Do not stop taking the antibiotic even if you start to feel better.  Talk with your health care provider about whether you need vaccinations to help prevent infections. This may be needed if treatment included surgery to remove the spleen. Not having a spleen makes certain infections more dangerous because it weakens the body's disease-fighting system (immune system). General instructions  Follow instructions from your health care provider about limiting your activities. To avoid injury or a ruptured spleen, make sure you: ? Avoid contact sports. ? Wear a seat belt in the car.  Keep all follow-up visits as told by your health care provider. This is important. Contact a health care provider if:  Your symptoms do not improve as expected.  You have a fever or chills.  You feel generally ill.  You have increased pain when you take in a breath. Get help right away if you:  Experience an injury or impact to the spleen area.  Have abdominal pain that becomes severe.  Feel dizzy or you faint.  Feel very weak.  Have cold and clammy skin.  Have sweating for no  reason.  Have chest pain or difficulty breathing. Summary  An enlarged spleen (splenomegaly) is when the spleen is larger than normal.  An enlarged spleen is usually a sign of another health problem.  This condition is treated with medicines, radiation therapy, blood transfusions, vaccines, or surgery. This information is not intended to replace advice given to you by your health care provider. Make sure you discuss any questions you have with your health care provider. Document Revised: 09/11/2018 Document Reviewed: 07/01/2018 Elsevier Patient Education  2020 [employer].

## 2019-11-17 NOTE — Progress Notes (Signed)
Patient ID: Kimberly [name], female    DOB: September 13, 1960  Age: 59 y.o. MRN: [id number]    Subjective:  Subjective  HPI Kimberly [name] presents for f/u L flank pain   Renal scan done in [place] --- no kidney stone.   Pt has had cxr also.   Ct showed splenomegaly that may have worsened.  Platelets have also dropped.    Pt still has some brain fog and light lightheaded.    Review of Systems  Constitutional: Negative for appetite change, diaphoresis, fatigue and unexpected weight change.  Eyes: Negative for pain, redness and visual disturbance.  Respiratory: Negative for cough, chest tightness, shortness of breath and wheezing.   Cardiovascular: Negative for chest pain, palpitations and leg swelling.  Endocrine: Negative for cold intolerance, heat intolerance, polydipsia, polyphagia and polyuria.  Genitourinary: Negative for difficulty urinating, dysuria and frequency.  Neurological: Positive for light-headedness. Negative for dizziness, numbness and headaches.    History Past Medical History:  Diagnosis Date  . Anxiety    takes Ativan as needed  . Bronchitis    hx of;last time > 40yrago  . Cancer ([facility]    part of left lower lung removed- lung ca 2013  . Colitis   . Colon polyps    hx of  . Complication of anesthesia    gas and MAC procedures  . Depression    takes Wellbutrin daily  . Diabetes mellitus    Novolog and Levemir daily  . DM2 (di[facility] mellitus, type 2) ([facility]   . Eczema   . GERD (gastroesophageal reflux disease)    takes Protonix nightly  . Headache(784.0)    stress HA frequently  . IBS (irritable bowel syndrome)   . Lung mass    left upper lobe  . Pneumonia    walking pneumonia in 2007  . PONV (postoperative nausea and vomiting)   . Shortness of breath    certain times of the year  . Stress incontinence     She has a past surgical history that includes CAESAREAN SECTION (96/98/2000); Rectal fistula (1993); Uterine fibroid surgery; colonosocpy; Biopsy thyroid;  and Thoracotomy (10/26/2011).   Her family history includes Anesthesia problems in her sister; Breast cancer in her maternal aunt and another family member; Cancer in her father, maternal grandfather, and mother; Diabetes in her mother; Heart disease in her maternal grandfather and maternal grandmother; Hyperlipidemia in her maternal grandfather and mother; Migraines in her sister.She reports that she quit smoking about 37 years ago. Her smoking use included cigarettes. She has never used smokeless tobacco. She reports that she does not drink alcohol or use drugs.  Current Outpatient Medications on File Prior to Visit  Medication Sig Dispense Refill  . ACCU-CHEK FASTCLIX LANCETS MISC   0  . ACCU-CHEK SMARTVIEW test strip TEST 4 TO 5 TIMES PER DAY 100 each 11  . albuterol (PROVENTIL) (2.5 MG/3ML) 0.083% nebulizer solution Take 3 mLs (2.5 mg total) by nebulization every 6 (six) hours as needed for wheezing or shortness of breath. 150 mL 1  . buPROPion (WELLBUTRIN XL) 150 MG 24 hr tablet TAKE 1 TABLET(150 MG) BY MOUTH EVERY EVENING.  Need ov before any more refills. (Patient taking differently: Take 150 mg by mouth every evening. ) 90 tablet 0  . Continuous Blood Gluc Sensor (FREESTYLE LIBRE 2 SENSOR) MISC INJECT 1 SENSOR INTO SKIN EVERY 14 DAYS    . insulin aspart (NOVOLOG FLEXPEN) 100 UNIT/ML injection Inject 1-20 Units into the skin 3 (three) times  daily before meals. Sliding scale    . LANTUS SOLOSTAR 100 UNIT/ML Solostar Pen Inject 50 Units into the skin in the morning and at bedtime. 15 pen 1  . LORazepam (ATIVAN) 0.5 MG tablet Take 1 tablet (0.5 mg total) by mouth 2 (two) times daily as needed. for anxiety 60 tablet 1  . metFORMIN (GLUCOPHAGE-XR) 500 MG 24 hr tablet Take 1,000 mg by mouth 2 (two) times daily.    . ondansetron (ZOFRAN ODT) 4 MG disintegrating tablet 60m ODT q8 hours prn nausea/vomit 4 tablet 0  . pantoprazole (PROTONIX) 40 MG tablet TAKE 1 TABLET(40 MG) BY MOUTH TWICE DAILY.  Needs  ov before any more refills 180 tablet 2   No current facility-administered medications on file prior to visit.     Objective:  Objective  Physical Exam Vitals and nursing note reviewed.  Constitutional:      Appearance: She is well-developed.  HENT:     Head: Normocephalic and atraumatic.  Eyes:     Conjunctiva/sclera: Conjunctivae normal.  Neck:     Thyroid: No thyromegaly.     Vascular: No carotid bruit or JVD.  Cardiovascular:     Rate and Rhythm: Normal rate and regular rhythm.     Heart sounds: Normal heart sounds. No murmur.  Pulmonary:     Effort: Pulmonary effort is normal. No respiratory distress.     Breath sounds: Normal breath sounds. No wheezing or rales.  Chest:     Chest wall: No tenderness.  Musculoskeletal:        General: Tenderness present.     Cervical back: Normal range of motion and neck supple.       Back:  Neurological:     Mental Status: She is alert and oriented to person, place, and time.    BP (!) 148/80 (BP Location: Right Arm, Patient Position: Sitting, Cuff Size: Large)   Pulse (!) 102   Temp (!) 97.1 F (36.2 C) (Temporal)   Resp 18   Ht _0  (1.778 m)   Wt 267 lb 6.4 oz (121.3 kg)   SpO2 97%   BMI 38.37 kg/m  Wt Readings from Last 3 Encounters:  11/17/19 267 lb 6.4 oz (121.3 kg)  11/12/19 272 lb (123.4 kg)  10/19/19 275 lb (124.7 kg)     Lab Results  Component Value Date   WBC 3.4 (L) 11/12/2019   HGB 13.2 11/12/2019   HCT 41.1 11/12/2019   PLT 115 (L) 11/12/2019   GLUCOSE 204 (H) 11/12/2019   ALT 23 10/19/2019   AST 29 10/19/2019   NA 138 11/12/2019   K 4.2 11/12/2019   CL 101 11/12/2019   CREATININE 0.77 11/12/2019   BUN 12 11/12/2019   CO2 27 11/12/2019   TSH 0.56 03/05/2008   INR 1.2 10/19/2019   HGBA1C 9.0 (A) 03/13/2018   MICROALBUR 1.5 04/10/2018    CT Renal Stone Study  Result Date: 11/12/2019 CLINICAL DATA:  Left side flank pain EXAM: CT ABDOMEN AND PELVIS WITHOUT CONTRAST TECHNIQUE: Multidetector  CT imaging of the abdomen and pelvis was performed following the standard protocol without IV contrast. COMPARISON:  08/25/2011 FINDINGS: Lower chest: Scarring in the lung bases.  No acute abnormality. Hepatobiliary: Layering gallstones within the gallbladder. Nodular contours of the liver compatible with cirrhosis. No visible focal abnormality. Pancreas: No focal abnormality or ductal dilatation. Spleen: Splenomegaly with craniocaudal length 16.7 cm. No focal abnormality. Adrenals/Urinary Tract: No adrenal abnormality. No focal renal abnormality. No stones or hydronephrosis. Urinary  bladder is unremarkable. Stomach/Bowel: Moderate stool burden. Stomach, large and small bowel grossly unremarkable. Vascular/Lymphatic: No evidence of aneurysm or adenopathy. Reproductive: Uterus and adnexa unremarkable.  No mass. Other: No free fluid or free air. Musculoskeletal: No acute bony abnormality. IMPRESSION: No evidence of renal or ureteral stones.  No hydronephrosis. Nodular contours of the liver compatible with cirrhosis. Associated splenomegaly. Cholelithiasis Moderate stool burden. Electronically Signed   By: Rolm Baptise M.D.   On: 11/12/2019 08:07     Assessment & Plan:  Plan  I am having Kimberly [name] start on traMADol. I am also having her maintain her insulin aspart, Accu-Chek FastClix Lancets, albuterol, Accu-Chek SmartView, buPROPion, LORazepam, metFORMIN, pantoprazole, Lantus SoloStar, ondansetron, and FreeStyle Libre 2 Sensor.  Meds ordered this encounter  Medications  . traMADol (ULTRAM) 50 MG tablet    Sig: Take 1 tablet (50 mg total) by mouth every 8 (eight) hours as needed for up to 5 days.    Dispense:  15 tablet    Refill:  0    Problem List Items Addressed This Visit      Unprioritized   Abnormal white blood cell    Recheck labs If platelets lower or same consider surgery referral / hematology      Relevant Orders   CBC with Differential/Platelet   Left flank pain   Relevant  Medications   traMADol (ULTRAM) 50 MG tablet    Other Visit Diagnoses    History of COVID-19    -  Primary      Follow-up: Return in about 3 months (around 02/17/2020), or if symptoms worsen or fail to improve, for hyperlipidemia, diabetes II.  [name], DO

## 2019-11-19 ENCOUNTER — Telehealth: Payer: Self-pay

## 2019-11-19 NOTE — Telephone Encounter (Signed)
Replied to patient via Mychart

## 2019-11-19 NOTE — Telephone Encounter (Signed)
After hours call:   Caller states she had COVID a month ago with pneumonia. She has developed complications. She was getting a referral to hematology clinic and sent a note through mychart because she would like to see, Burney Gauze.

## 2019-11-24 ENCOUNTER — Ambulatory Visit: Payer: 59 | Admitting: Hematology

## 2019-11-24 ENCOUNTER — Other Ambulatory Visit: Payer: 59

## 2019-11-30 ENCOUNTER — Inpatient Hospital Stay (HOSPITAL_BASED_OUTPATIENT_CLINIC_OR_DEPARTMENT_OTHER): Payer: 59 | Admitting: Hematology & Oncology

## 2019-11-30 ENCOUNTER — Inpatient Hospital Stay: Payer: 59 | Attending: Hematology & Oncology

## 2019-11-30 ENCOUNTER — Other Ambulatory Visit: Payer: Self-pay

## 2019-11-30 ENCOUNTER — Encounter: Payer: Self-pay | Admitting: Hematology & Oncology

## 2019-11-30 ENCOUNTER — Telehealth: Payer: Self-pay | Admitting: Hematology & Oncology

## 2019-11-30 VITALS — BP 151/94 | HR 84 | Temp 97.7°F | Resp 18 | Ht 70.0 in | Wt 271.0 lb

## 2019-11-30 DIAGNOSIS — Z808 Family history of malignant neoplasm of other organs or systems: Secondary | ICD-10-CM | POA: Insufficient documentation

## 2019-11-30 DIAGNOSIS — K7581 Nonalcoholic steatohepatitis (NASH): Secondary | ICD-10-CM

## 2019-11-30 DIAGNOSIS — E1165 Type 2 diabetes mellitus with hyperglycemia: Secondary | ICD-10-CM | POA: Diagnosis not present

## 2019-11-30 DIAGNOSIS — Z794 Long term (current) use of insulin: Secondary | ICD-10-CM | POA: Diagnosis not present

## 2019-11-30 DIAGNOSIS — K7460 Unspecified cirrhosis of liver: Secondary | ICD-10-CM | POA: Insufficient documentation

## 2019-11-30 DIAGNOSIS — Z803 Family history of malignant neoplasm of breast: Secondary | ICD-10-CM

## 2019-11-30 DIAGNOSIS — Z8051 Family history of malignant neoplasm of kidney: Secondary | ICD-10-CM | POA: Diagnosis not present

## 2019-11-30 DIAGNOSIS — R161 Splenomegaly, not elsewhere classified: Secondary | ICD-10-CM

## 2019-11-30 DIAGNOSIS — Z85110 Personal history of malignant carcinoid tumor of bronchus and lung: Secondary | ICD-10-CM | POA: Insufficient documentation

## 2019-11-30 DIAGNOSIS — E1142 Type 2 diabetes mellitus with diabetic polyneuropathy: Secondary | ICD-10-CM

## 2019-11-30 DIAGNOSIS — Z87891 Personal history of nicotine dependence: Secondary | ICD-10-CM | POA: Diagnosis not present

## 2019-11-30 DIAGNOSIS — D696 Thrombocytopenia, unspecified: Secondary | ICD-10-CM | POA: Insufficient documentation

## 2019-11-30 DIAGNOSIS — K760 Fatty (change of) liver, not elsewhere classified: Secondary | ICD-10-CM

## 2019-11-30 LAB — CBC WITH DIFFERENTIAL (CANCER CENTER ONLY)
Abs Immature Granulocytes: 0.01 10*3/uL (ref 0.00–0.07)
Basophils Absolute: 0 10*3/uL (ref 0.0–0.1)
Basophils Relative: 1 %
Eosinophils Absolute: 0.1 10*3/uL (ref 0.0–0.5)
Eosinophils Relative: 3 %
HCT: 42 % (ref 36.0–46.0)
Hemoglobin: 13.6 g/dL (ref 12.0–15.0)
Immature Granulocytes: 0 %
Lymphocytes Relative: 39 %
Lymphs Abs: 1.3 10*3/uL (ref 0.7–4.0)
MCH: 27.2 pg (ref 26.0–34.0)
MCHC: 32.4 g/dL (ref 30.0–36.0)
MCV: 84 fL (ref 80.0–100.0)
Monocytes Absolute: 0.2 10*3/uL (ref 0.1–1.0)
Monocytes Relative: 7 %
Neutro Abs: 1.7 10*3/uL (ref 1.7–7.7)
Neutrophils Relative %: 50 %
Platelet Count: 119 10*3/uL — ABNORMAL LOW (ref 150–400)
RBC: 5 MIL/uL (ref 3.87–5.11)
RDW: 15.4 % (ref 11.5–15.5)
WBC Count: 3.3 10*3/uL — ABNORMAL LOW (ref 4.0–10.5)
nRBC: 0 % (ref 0.0–0.2)

## 2019-11-30 LAB — CMP (CANCER CENTER ONLY)
ALT: 32 U/L (ref 0–44)
AST: 26 U/L (ref 15–41)
Albumin: 4.3 g/dL (ref 3.5–5.0)
Alkaline Phosphatase: 146 U/L — ABNORMAL HIGH (ref 38–126)
Anion gap: 7 (ref 5–15)
BUN: 12 mg/dL (ref 6–20)
CO2: 29 mmol/L (ref 22–32)
Calcium: 10.2 mg/dL (ref 8.9–10.3)
Chloride: 101 mmol/L (ref 98–111)
Creatinine: 0.71 mg/dL (ref 0.44–1.00)
GFR, Est AFR Am: >60 mL/min (ref >60)
GFR, Estimated: >60 mL/min (ref >60)
Glucose, Bld: 296 mg/dL — ABNORMAL HIGH (ref 70–99)
Potassium: 4.5 mmol/L (ref 3.5–5.1)
Sodium: 137 mmol/L (ref 135–145)
Total Bilirubin: 0.7 mg/dL (ref 0.3–1.2)
Total Protein: 7.3 g/dL (ref 6.5–8.1)

## 2019-11-30 LAB — SAVE SMEAR(SSMR), FOR PROVIDER SLIDE REVIEW

## 2019-11-30 LAB — LACTATE DEHYDROGENASE: LDH: 158 U/L (ref 98–192)

## 2019-11-30 LAB — PLATELET BY CITRATE

## 2019-11-30 LAB — VITAMIN B12: Vitamin B-12: 439 pg/mL (ref 180–914)

## 2019-11-30 NOTE — Progress Notes (Signed)
Referral MD  Reason for Referral: Mild thrombocytopenia likely secondary to NASH cirrhosis/splenomegaly  No chief complaint on file. : My platelets are low.    HPI: Kimberly [name] is a very charming 59 year old white female.  She is originally from [place].  She has 3 children.  She has had carcinoid of the left lung.  This is on 1.5 cm.  It was well differentiated.  There is no need for any type of therapy.  She apparently had the COVID 19 infection back in February.  She got it from her son.  She had to go to the emergency room but was not admitted.  She is still having problems.  She still has pain.  She has had fatigue.  She has had memory difficulties.  She had a CT angiogram done recently.  This showed some pneumonia which was probably from the coronavirus.  She has some small hilar lymph nodes.  What was also noted was cirrhosis and also splenomegaly.  She does have longstanding diabetes.  I do think is all that well controlled.  This morning, her blood sugar was 300.  She has been found to have mild thrombocytopenia.  She is followed by Dr. Carollee Herter.  Back in March of this year, her platelet count was 115,000.  A month ago the platelet count was 146,000.  About a week later on March 23, her white cell count 3.5.  Hemoglobin 14.1.  Platelet count 105,000.  MCV was 84.  She had a normal white cell differential.  Because of the thrombocytopenia and splenomegaly, it was felt that Kimberly [name] needed to be seen at the [facility] for an evaluation.  Outside of the lung surgery, she really has had no other surgeries.  She was a smoker.  She stopped in 1984.  She does not drink alcohol.  As far she knows, there is no problems in the family with blood.  She thinks her mother may have cirrhosis from diabetes.  She has had no rashes.  She has had no bleeding.  She has had no weight loss or weight gain.  She has had no palpable lymph nodes.  Overall, I would say her  performance status is ECOG 1.   Past Medical History:  Diagnosis Date  . Anxiety    takes Ativan as needed  . Bronchitis    hx of;last time > 81yrago  . Cancer ([facility]    part of left lower lung removed- lung ca 2013  . Colitis   . Colon polyps    hx of  . Complication of anesthesia    gas and MAC procedures  . Depression    takes Wellbutrin daily  . Diabetes mellitus    Novolog and Levemir daily  . DM2 (diabetes mellitus, type 2) ([facility]   . Eczema   . GERD (gastroesophageal reflux disease)    takes Protonix nightly  . Headache(784.0)    stress HA frequently  . IBS (irritable bowel syndrome)   . Lung mass    left upper lobe  . Pneumonia    walking pneumonia in 2007  . PONV (postoperative nausea and vomiting)   . Shortness of breath    certain times of the year  . Stress incontinence   :  Past Surgical History:  Procedure Laterality Date  . BIOPSY THYROID     pt has thyroid polyps another scan in Mar 2013  . CAESAREAN SECTION  96/98/2000  . colonosocpy    .  Rectal fistula  1993  . THORACOTOMY  10/26/2011   Procedure: THORACOTOMY MAJOR;  Surgeon: Gaye Pollack, MD;  Location: [facility] OR;  Service: Thoracic;  Laterality: Left;  . UTERINE FIBROID SURGERY     late 30's early 40's  :   Current Outpatient Medications:  .  ACCU-CHEK FASTCLIX LANCETS MISC, , Disp: , Rfl: 0 .  ACCU-CHEK SMARTVIEW test strip, TEST 4 TO 5 TIMES PER DAY, Disp: 100 each, Rfl: 11 .  albuterol (PROVENTIL) (2.5 MG/3ML) 0.083% nebulizer solution, Take 3 mLs (2.5 mg total) by nebulization every 6 (six) hours as needed for wheezing or shortness of breath., Disp: 150 mL, Rfl: 1 .  buPROPion (WELLBUTRIN XL) 150 MG 24 hr tablet, TAKE 1 TABLET(150 MG) BY MOUTH EVERY EVENING.  Need ov before any more refills. (Patient taking differently: Take 150 mg by mouth every evening. ), Disp: 90 tablet, Rfl: 0 .  Continuous Blood Gluc Sensor (FREESTYLE LIBRE 2 SENSOR) MISC, INJECT 1 SENSOR INTO SKIN EVERY 14 DAYS, Disp: ,  Rfl:  .  insulin aspart (NOVOLOG FLEXPEN) 100 UNIT/ML injection, Inject 1-20 Units into the skin 3 (three) times daily before meals. Sliding scale, Disp: , Rfl:  .  LANTUS SOLOSTAR 100 UNIT/ML Solostar Pen, Inject 50 Units into the skin in the morning and at bedtime., Disp: 15 pen, Rfl: 1 .  LORazepam (ATIVAN) 0.5 MG tablet, Take 1 tablet (0.5 mg total) by mouth 2 (two) times daily as needed. for anxiety, Disp: 60 tablet, Rfl: 1 .  metFORMIN (GLUCOPHAGE-XR) 500 MG 24 hr tablet, Take 1,000 mg by mouth 2 (two) times daily., Disp: , Rfl:  .  ondansetron (ZOFRAN ODT) 4 MG disintegrating tablet, 21m ODT q8 hours prn nausea/vomit, Disp: 4 tablet, Rfl: 0 .  pantoprazole (PROTONIX) 40 MG tablet, TAKE 1 TABLET(40 MG) BY MOUTH TWICE DAILY.  Needs ov before any more refills, Disp: 180 tablet, Rfl: 2:  :  Allergies  Allergen Reactions  . Codeine Nausea And Vomiting    migraines  . Diflucan [Fluconazole] Other (See Comments)    Blisters  . Metoclopramide Hcl Other (See Comments)    Do not give at all;muscle jerking  . Penicillins Swelling    REACTION: swelling hands  . Prednisone Nausea And Vomiting    migraines  :  Family History  Problem Relation Age of Onset  . Hyperlipidemia Mother   . Diabetes Mother   . Cancer Mother        kidney   . Cancer Father        liver  . Breast cancer Maternal Aunt   . Heart disease Maternal Grandmother   . Cancer Maternal Grandfather        kidney   . Heart disease Maternal Grandfather   . Hyperlipidemia Maternal Grandfather   . Breast cancer Other   . Anesthesia problems Sister   . Migraines Sister   . Hypotension Neg Hx   . Malignant hyperthermia Neg Hx   . Pseudochol deficiency Neg Hx   :  Social History   Socioeconomic History  . Marital status: Single    Spouse name: Not on file  . Number of children: 4  . Years of education: AAS  . Highest education level: Not on file  Occupational History  . Occupation: N/A  Tobacco Use  . Smoking  status: Former Smoker    Types: Cigarettes    Quit date: 08/27/1982    Years since quitting: 37.2  . Smokeless tobacco: Never Used  Substance and Sexual Activity  . Alcohol use: No  . Drug use: No  . Sexual activity: Not Currently    Birth control/protection: Post-menopausal  Other Topics Concern  . Not on file  Social History Narrative   Lives at home w/ her sons   Right-handed   Caffeine: cup of coffee each morning      Does not regularly exercise.    Social Determinants of Health   Financial Resource Strain:   . Difficulty of Paying Living Expenses:   Food Insecurity:   . Worried About Charity fundraiser in the Last Year:   . Arboriculturist in the Last Year:   Transportation Needs:   . Film/video editor (Medical):   Marland Kitchen Lack of Transportation (Non-Medical):   Physical Activity:   . Days of Exercise per Week:   . Minutes of Exercise per Session:   Stress:   . Feeling of Stress :   Social Connections:   . Frequency of Communication with Friends and Family:   . Frequency of Social Gatherings with Friends and Family:   . Attends Religious Services:   . Active Member of Clubs or Organizations:   . Attends Archivist Meetings:   Marland Kitchen Marital Status:   Intimate Partner Violence:   . Fear of Current or Ex-Partner:   . Emotionally Abused:   Marland Kitchen Physically Abused:   . Sexually Abused:   :  Review of Systems  Constitutional: Positive for malaise/fatigue.  HENT: Negative.   Eyes: Negative.   Respiratory: Negative.   Cardiovascular: Negative.   Gastrointestinal: Positive for diarrhea.  Genitourinary: Negative.   Musculoskeletal: Positive for myalgias.  Skin: Negative.   Neurological: Negative.   Endo/Heme/Allergies: Negative.   Psychiatric/Behavioral: Negative.      Exam:  Physical Exam Vitals reviewed.  HENT:     Head: Normocephalic and atraumatic.  Eyes:     Pupils: Pupils are equal, round, and reactive to light.  Cardiovascular:     Rate and  Rhythm: Normal rate and regular rhythm.     Heart sounds: Normal heart sounds.  Pulmonary:     Effort: Pulmonary effort is normal.     Breath sounds: Normal breath sounds.  Abdominal:     General: Bowel sounds are normal.     Palpations: Abdomen is soft.  Musculoskeletal:        General: No tenderness or deformity. Normal range of motion.     Cervical back: Normal range of motion.  Lymphadenopathy:     Cervical: No cervical adenopathy.  Skin:    General: Skin is warm and dry.     Findings: No erythema or rash.  Neurological:     Mental Status: She is alert and oriented to person, place, and time.  Psychiatric:        Behavior: Behavior normal.        Thought Content: Thought content normal.        Judgment: Judgment normal.      _0 @   Recent Labs    11/30/19 1048  WBC 3.3*  HGB 13.6  HCT 42.0  PLT 119*   Recent Labs    11/30/19 1048  NA 137  K 4.5  CL 101  CO2 29  GLUCOSE 296*  BUN 12  CREATININE 0.71  CALCIUM 10.2    Blood smear review: Normochromic and normocytic population of red blood cells.  There is no nucleated red blood cells.  I see no teardrop cells.  There  is no rouleaux formation.  I see no target cells.  There are no schistocytes or spherocytes.  White blood cells appear normal in morphology maturation.  There may be slight decrease in number.  There is no hypersegmented polys.  There is no immature myeloid or lymphoid forms.  Platelets are adequate in number and size.  There might be a few large platelets.  Pathology: None    Assessment and Plan: Kimberly [name] is a very nice 59 year old YO female.  She has mild thrombocytopenia.  I suspect this is all from her NASH cirrhosis with subsequent splenomegaly.  This is a very common scenario.  Unfortunately, I do not think there is much that can be done since the basic underlying physiology is the cirrhosis from her diabetes.  Her blood sugar certainly are not that well controlled.  I told her that  as long as her blood sugars stay high like they are, the cirrhosis will continue to progress.  Her platelet count really is not that bad.  It would never cause her problems at the current level.  I suspect it probably will go down a little bit more as her cirrhosis progresses.  Just to make sure that there is nothing else going on, I would probably would repeat a CT scan of the body in 3 months.  She had enlarged hilar lymph nodes which I am sure are from the coronavirus that she had in the pneumonia that she developed.  However, I want to make sure that we are not overlooking some type of lymphoproliferative issue that might be causing the thrombocytopenia.  It was fun talking to Ms. Barsky.  She is very eloquent.  Spent about 45 minutes with her today.  I reviewed her lab work with her.  I went over my recommendations.  I answered all of her questions.

## 2019-11-30 NOTE — Telephone Encounter (Signed)
Called and LMVM for patient regarding appointments scheduled. She will be notified once CT has been approved and scheduled for same day as follow up w/ Dr Marin Olp per 4/5 los

## 2019-12-01 ENCOUNTER — Other Ambulatory Visit: Payer: Self-pay | Admitting: *Deleted

## 2019-12-18 LAB — HM DIABETES EYE EXAM

## 2019-12-22 ENCOUNTER — Ambulatory Visit: Payer: 59 | Admitting: Internal Medicine

## 2020-01-12 ENCOUNTER — Encounter: Payer: Self-pay | Admitting: Family Medicine

## 2020-01-13 NOTE — Telephone Encounter (Signed)
FYI

## 2020-01-13 NOTE — Telephone Encounter (Signed)
If its been >21 days she may need ov --- but I know it can take months to feel better

## 2020-01-26 ENCOUNTER — Encounter: Payer: Self-pay | Admitting: Family Medicine

## 2020-01-26 ENCOUNTER — Other Ambulatory Visit: Payer: Self-pay | Admitting: Internal Medicine

## 2020-01-27 ENCOUNTER — Encounter: Payer: Self-pay | Admitting: Family Medicine

## 2020-01-27 NOTE — Telephone Encounter (Signed)
Pt needs note to continue working from home. See below. Please advise

## 2020-02-05 ENCOUNTER — Ambulatory Visit (INDEPENDENT_AMBULATORY_CARE_PROVIDER_SITE_OTHER): Payer: 59 | Admitting: Internal Medicine

## 2020-02-05 ENCOUNTER — Other Ambulatory Visit: Payer: Self-pay

## 2020-02-05 ENCOUNTER — Encounter: Payer: Self-pay | Admitting: Internal Medicine

## 2020-02-05 VITALS — BP 170/84 | HR 106 | Ht 70.0 in | Wt 277.0 lb

## 2020-02-05 DIAGNOSIS — E1142 Type 2 diabetes mellitus with diabetic polyneuropathy: Secondary | ICD-10-CM | POA: Diagnosis not present

## 2020-02-05 DIAGNOSIS — E1165 Type 2 diabetes mellitus with hyperglycemia: Secondary | ICD-10-CM

## 2020-02-05 LAB — POCT GLYCOSYLATED HEMOGLOBIN (HGB A1C): Hemoglobin A1C: 7.7 % — AB (ref 4.0–5.6)

## 2020-02-05 NOTE — Progress Notes (Signed)
Patient ID: Kimberly [name], female   DOB: Jun 15, 1961, 59 y.o.   MRN: [id number]   This visit occurred during the SARS-CoV-2 public health emergency.  Safety protocols were in place, including screening questions prior to the visit, additional usage of staff PPE, and extensive cleaning of exam room while observing appropriate contact time as indicated for disinfecting solutions.   HPI: Kimberly [name] is a 59 y.o.-year-old female, returning for follow-up for DM2, dx in 2007-2008, insulin-dependent since 2008-2009, uncontrolled, without long term complications.  Last visit almost 2 years ago!  Patient had COVID-19 09/2019 and also pneumonia at that time.  She is still recovering.  Reviewed HbA1c levels: Lab Results  Component Value Date   HGBA1C 9.0 (A) 03/13/2018   HGBA1C 8.9% 12/05/2017   HGBA1C 7.4 (H) 09/13/2006  10/27/2016: HbA1c 8.0%  Pt was on a regimen of: - Metformin 1000 mg 2x a day, with meals - Lantus 52 units in am and 52 units at bedtime - Novolog 35 (but actually taking 25) units 3x a day, before b'fast and at bedtime! Tried Victoza >> GERD, chronic cough and Es pbs, severe AP Also tried another medication >> cough, AP.  At last visit she was on: - Metformin 1000 mg 2x a day with meals  - Lantus (did not start Toujeo yet) 52 units 2x a day - Novolog - injected 15 minutes before a meal - misses lunchtime dose at work! - 25 units for a small meal - 30 units for a regular meal - 35 units for a large meal Takes 10 units Novolog prn and 20 units at bedtime prn.  At last visit, I advised her to change to: - Metformin 1000 mg 2x a day with meals  - U500 insulin - 30 min before meals: - before b'fast: 50-60 units - before lunch: 40-50 units - before dinner: 40-50 units  However, she did not start the U500, but continues on: - Metformin 1000 mg 2x a day - Lantus (free through pt assintance) 40-50 (depending on the sugars) 2x a day - NovoLog (cannot afford it now):   <200: 15 units >200: 20 units - forgets at dinnertime  Pt checks her sugars 4 times a day with the Freestyle Libre 2 CGM (does not have her with her so we could not download the reports today): - am: 150-215 >> 180, 200-213 >> 116-240 (higher when she has icecream at night) - 2h after b'fast: n/c - before lunch: n/c >> low 200 >> 200s - 2h after lunch: n/c - before dinner: n/c >> ? >> n/c >> 90s after shopping - 2h after dinner: >> n/c >> 260-270 (takes mealtime) NovoLog  - bedtime: upper 200s >> mid 200s >> n/c - nighttime: n/c Lowest sugar was 110 >> 180 >> 93; she has hypoglycemia awareness at 100.  Highest sugar was 350 >> 200s >> 340.  Glucometer: AccuChek nano >> Accu-Chek guide  Pt's meals are: - Breakfast: shredded wheat with 2% milk - Lunch: PB sandwich, yoghurt - Dinner: chicken, beef, starch, green beens - Snacks: yoghurt, OJ or cranberry juice  -No CKD, last BUN/creatinine:  Lab Results  Component Value Date   BUN 12 11/30/2019   BUN 12 11/12/2019   CREATININE 0.71 11/30/2019   CREATININE 0.77 11/12/2019   -Unclear if she has hyperlipidemia; last set of lipids: 03/20/2019: 217/202/65/125 No results found for: CHOL, HDL, LDLCALC, LDLDIRECT, TRIG, CHOLHDL   - last eye exam was on 12/18/2019: + DR.   -She  has numbness and tingling in her feet  Pt has FH of DM in M.  She has a h/o lung carcinoid in 2013.  She has a history of frequent sinus and bladder infections.  She also has a history of thyroid nodules, previously followed by Dr. Tamala Julian.  Reviewed the latest thyroid ultrasound from 2016.  She has several nodules, of which some are slightly smaller and some slightly larger.  She has occasional dysphagia, which is chronic.  ROS: Constitutional: no weight gain/no weight loss, + fatigue, + subjective hyperthermia, no subjective hypothermia Eyes: no blurry vision, no xerophthalmia ENT: no sore throat, no nodules palpated in neck, + occasional dysphagia, no  odynophagia, no hoarseness Cardiovascular: no CP/no SOB/no palpitations/no leg swelling Respiratory: no cough/no SOB/no wheezing Gastrointestinal: no N/no V/no D/no C/no acid reflux Musculoskeletal: no muscle aches/+joint aches and stiffness Skin: no rashes, no hair loss Neurological: no tremors/+ numbness/+ tingling/no dizziness  I reviewed pt's medications, allergies, PMH, social hx, family hx, and changes were documented in the history of present illness. Otherwise, unchanged from my initial visit note.   Past Medical History:  Diagnosis Date  . Anxiety    takes Ativan as needed  . Bronchitis    hx of;last time > 19yrago  . Cancer ([facility]    part of left lower lung removed- lung ca 2013  . Colitis   . Colon polyps    hx of  . Complication of anesthesia    gas and MAC procedures  . Depression    takes Wellbutrin daily  . Diabetes mellitus    Novolog and Levemir daily  . [place]   . Eczema   . GERD (gastroesophageal reflux disease)    takes Protonix nightly  . Headache(784.0)    stress HA frequently  . IBS (irritable bowel syndrome)   . Lung mass    left upper lobe  . Pneumonia    walking pneumonia in 2007  . PONV (postoperative nausea and vomiting)   . Shortness of breath    certain times of the year  . Stress incontinence    Past Surgical History:  Procedure Laterality Date  . BIOPSY THYROID     pt has thyroid polyps another scan in Mar 2013  . CAESAREAN SECTION  96/98/2000  . colonosocpy    . Rectal fistula  1993  . THORACOTOMY  10/26/2011   Procedure: THORACOTOMY MAJOR;  Surgeon: BGaye Pollack MD;  [facility];  Service: Thoracic;  Laterality: Left;  . UTERINE FIBROID SURGERY     late 30's early 424's  Social History   Socioeconomic History  . Marital status: Single    Spouse name: Not on file  . Number of children: 4 (one deceased)  . Years of education: AAS  . Highest education level: Not on file  Occupational  History  . Occupation:  Tech support  Social Needs  . Financial resource strain: Not on file  . Food insecurity:    Worry: Not on file    Inability: Not on file  . Transportation needs:    Medical: Not on file    Non-medical: Not on file  Tobacco Use  . Smoking status: Former Smoker    Types: Cigarettes    Last attempt to quit: 08/27/1982    Years since quitting: 35.2  . Smokeless tobacco: Never Used  Substance and Sexual Activity  . Alcohol use: No  . Drug use: No  . Sexual activity:  Not Currently    Birth control/protection: Post-menopausal  Lifestyle  . Physical activity:    Days per week: Not on file    Minutes per session: Not on file  . Stress: Not on file  Relationships  . Social connections:    Talks on phone: Not on file    Gets together: Not on file    Attends religious service: Not on file    Active member of club or organization: Not on file    Attends meetings of clubs or organizations: Not on file    Relationship status: Not on file  . Intimate partner violence:    Fear of current or ex partner: Not on file    Emotionally abused: Not on file    Physically abused: Not on file    Forced sexual activity: Not on file  Other Topics Concern  . Not on file  Social History Narrative   Lives at home w/ her sons   Right-handed   Caffeine: cup of coffee each morning      Does not regularly exercise.    Current Outpatient Medications  Medication Sig Dispense Refill  . ACCU-CHEK FASTCLIX LANCETS MISC   0  . ACCU-CHEK SMARTVIEW test strip TEST 4 TO 5 TIMES PER DAY 100 each 11  . albuterol (PROVENTIL) (2.5 MG/3ML) 0.083% nebulizer solution Take 3 mLs (2.5 mg total) by nebulization every 6 (six) hours as needed for wheezing or shortness of breath. 150 mL 1  . buPROPion (WELLBUTRIN XL) 150 MG 24 hr tablet TAKE 1 TABLET(150 MG) BY MOUTH EVERY EVENING.  Need ov before any more refills. (Patient taking differently: Take 150 mg by mouth every evening. ) 90 tablet 0  .  Continuous Blood Gluc Sensor (FREESTYLE LIBRE 2 SENSOR) MISC INJECT 1 SENSOR INTO SKIN EVERY 14 DAYS    . insulin aspart (NOVOLOG FLEXPEN) 100 UNIT/ML injection Inject 1-20 Units into the skin 3 (three) times daily before meals. Sliding scale    . LANTUS SOLOSTAR 100 UNIT/ML Solostar Pen ADMINISTER 50 UNITS UNDER THE SKIN IN THE MORNING AND AT BEDTIME 45 mL 1  . LORazepam (ATIVAN) 0.5 MG tablet Take 1 tablet (0.5 mg total) by mouth 2 (two) times daily as needed. for anxiety 60 tablet 1  . metFORMIN (GLUCOPHAGE-XR) 500 MG 24 hr tablet Take 1,000 mg by mouth 2 (two) times daily.    . ondansetron (ZOFRAN ODT) 4 MG disintegrating tablet 28m ODT q8 hours prn nausea/vomit 4 tablet 0  . pantoprazole (PROTONIX) 40 MG tablet TAKE 1 TABLET(40 MG) BY MOUTH TWICE DAILY.  Needs ov before any more refills 180 tablet 2   No current facility-administered medications for this visit.   Allergies  Allergen Reactions  . Codeine Nausea And Vomiting    migraines  . Diflucan [Fluconazole] Other (See Comments)    Blisters  . Metoclopramide Hcl Other (See Comments)    Do not give at all;muscle jerking  . Penicillins Swelling    REACTION: swelling hands  . Prednisone Nausea And Vomiting    migraines   Family History  Problem Relation Age of Onset  . Hyperlipidemia Mother   . Diabetes Mother   . Cancer Mother        kidney   . Cancer Father        liver  . Breast cancer Maternal Aunt   . Heart disease Maternal Grandmother   . Cancer Maternal Grandfather        kidney   . Heart disease  Maternal Grandfather   . Hyperlipidemia Maternal Grandfather   . Breast cancer Other   . Anesthesia problems Sister   . Migraines Sister   . Hypotension Neg Hx   . Malignant hyperthermia Neg Hx   . Pseudochol deficiency Neg Hx     PE: BP (!) 170/84   Pulse (!) 106   Ht _0  (1.778 m)   Wt 277 lb (125.6 kg)   SpO2 98%   BMI 39.75 kg/m  Wt Readings from Last 3 Encounters:  02/05/20 277 lb (125.6 kg)   11/30/19 271 lb (122.9 kg)  11/17/19 267 lb 6.4 oz (121.3 kg)   Constitutional: overweight, in NAD Eyes: PERRLA, EOMI, no exophthalmos ENT: moist mucous membranes, no thyromegaly, no cervical lymphadenopathy Cardiovascular: Tachycardia, RR, No MRG Respiratory: CTA B Gastrointestinal: abdomen soft, NT, ND, BS+ Musculoskeletal: no deformities, strength intact in all 4 Skin: moist, warm, no rashes Neurological: no tremor with outstretched hands, DTR normal in all 4  ASSESSMENT: 1. DM2, insulin-dependent, uncontrolled, with complications -Peripheral neuropathy  2.   Obesity class III  3. HL  PLAN:  1. Patient with longstanding, type 2 diabetes, on oral antidiabetic regimen with Metformin, and also basal-bolus insulin regimen, returning after long absence of almost 2 years.  At last visit, she was taking large doses of NovoLog and also Toujeo with still poor control and an HbA1c of 9.0%.  I recommended to start U500 insulin but she was lost for follow-up afterwards. -At this visit, sugars are still variable but she feels that they improved some after the starting the freestyle libre 2 CGM.  She also likes the fact that she has alarms and scans the device whenever alarmed.  Sugars are still above target at all times of the day.  Upon questioning, she is taking the insulin with meals after the meals, sometimes even 2 hours after, and we discussed that it would be mandatory to move this before meals.  We will first work on this and I also advised her to try to use more flexible, and slightly higher doses of NovoLog, but this would not be a priority.  She is also varying the dose of Lantus and I advised him to stay with 45 units twice a day. -Since she cannot really afford NovoLog, I gave her a list of possible rapid acting insulins that we could use so she could check with her insurance about the coverage.  She is not interested in regular insulin since this does not come in a pen. -We discussed  about potentially retrying a GLP-1 receptor agonist but she had side effects from this and would not want to retry this class - I suggested to:  Patient Instructions  Please continue: - Metformin 1000 mg 2x a day with meals   Please change: - Lantus to 45 units 2x a day (last dose at bedtime) - NovoLog 18-25 units before meals  Please check with your insurance if they cover: - Humalog, Lyumjev (rapid acting Humalog) Novolog, Fiasp (rapid acting Novolog), Apidra  Please return in 3 months.   - we checked her HbA1c: 7.7% (better) - advised to check sugars at different times of the day - 3x a day, rotating check times - advised for yearly eye exams >> she is UTD - return to clinic in 3-4 months  2.  Obesity class III -Continue Metformin which is weight stabilizing long-term -She gained about 5 to 6 pounds net since last visit -Discussed about cutting out concentrated sweets like  ice cream especially at night  3. HL - Reviewed latest lipid panel from 02/2019: LDL above goal, as are her triglycerides -She is not on a statin -Improving her diabetes will most likely help  Philemon Kingdom, MD PhD [facility] Endocrinology

## 2020-02-05 NOTE — Patient Instructions (Addendum)
Please continue: - Metformin 1000 mg 2x a day with meals   Please change: - Lantus to 45 units 2x a day (last dose at bedtime) - NovoLog 18-25 units before meals  Please check with your insurance if they cover: - Humalog, Lyumjev (rapid acting Humalog) Novolog, Fiasp (rapid acting Novolog), Apidra  Please return in 3 months.

## 2020-02-18 ENCOUNTER — Encounter: Payer: Self-pay | Admitting: Family Medicine

## 2020-02-18 ENCOUNTER — Ambulatory Visit: Payer: 59 | Admitting: Family Medicine

## 2020-02-18 ENCOUNTER — Other Ambulatory Visit: Payer: Self-pay

## 2020-02-18 VITALS — BP 147/69 | HR 90 | Temp 97.7°F | Resp 16 | Ht 70.0 in | Wt 274.0 lb

## 2020-02-18 DIAGNOSIS — R413 Other amnesia: Secondary | ICD-10-CM

## 2020-02-18 DIAGNOSIS — Z8616 Personal history of COVID-19: Secondary | ICD-10-CM | POA: Insufficient documentation

## 2020-02-18 DIAGNOSIS — M791 Myalgia, unspecified site: Secondary | ICD-10-CM

## 2020-02-18 DIAGNOSIS — L989 Disorder of the skin and subcutaneous tissue, unspecified: Secondary | ICD-10-CM

## 2020-02-18 DIAGNOSIS — R5383 Other fatigue: Secondary | ICD-10-CM

## 2020-02-18 DIAGNOSIS — R079 Chest pain, unspecified: Secondary | ICD-10-CM

## 2020-02-18 HISTORY — DX: Disorder of the skin and subcutaneous tissue, unspecified: L98.9

## 2020-02-18 HISTORY — DX: Other fatigue: R53.83

## 2020-02-18 HISTORY — DX: Other amnesia: R41.3

## 2020-02-18 LAB — CBC WITH DIFFERENTIAL/PLATELET
Basophils Absolute: 0 10*3/uL (ref 0.0–0.1)
Basophils Relative: 0.6 % (ref 0.0–3.0)
Eosinophils Absolute: 0.1 10*3/uL (ref 0.0–0.7)
Eosinophils Relative: 2.1 % (ref 0.0–5.0)
HCT: 40.5 % (ref 36.0–46.0)
Hemoglobin: 13.6 g/dL (ref 12.0–15.0)
Lymphocytes Relative: 33.6 % (ref 12.0–46.0)
Lymphs Abs: 1.2 10*3/uL (ref 0.7–4.0)
MCHC: 33.6 g/dL (ref 30.0–36.0)
MCV: 83.3 fl (ref 78.0–100.0)
Monocytes Absolute: 0.2 10*3/uL (ref 0.1–1.0)
Monocytes Relative: 6.5 % (ref 3.0–12.0)
Neutro Abs: 2 10*3/uL (ref 1.4–7.7)
Neutrophils Relative %: 57.2 % (ref 43.0–77.0)
Platelets: 113 10*3/uL — ABNORMAL LOW (ref 150.0–400.0)
RBC: 4.87 Mil/uL (ref 3.87–5.11)
RDW: 15.2 % (ref 11.5–15.5)
WBC: 3.6 10*3/uL — ABNORMAL LOW (ref 4.0–10.5)

## 2020-02-18 LAB — COMPREHENSIVE METABOLIC PANEL
ALT: 24 U/L (ref 0–35)
AST: 24 U/L (ref 0–37)
Albumin: 4.3 g/dL (ref 3.5–5.2)
Alkaline Phosphatase: 136 U/L — ABNORMAL HIGH (ref 39–117)
BUN: 10 mg/dL (ref 6–23)
CO2: 33 mEq/L — ABNORMAL HIGH (ref 19–32)
Calcium: 10.2 mg/dL (ref 8.4–10.5)
Chloride: 105 mEq/L (ref 96–112)
Creatinine, Ser: 0.7 mg/dL (ref 0.40–1.20)
GFR: 85.35 mL/min (ref >60.00)
Glucose, Bld: 217 mg/dL — ABNORMAL HIGH (ref 70–99)
Potassium: 4.2 mEq/L (ref 3.5–5.1)
Sodium: 133 mEq/L — ABNORMAL LOW (ref 135–145)
Total Bilirubin: 0.6 mg/dL (ref 0.2–1.2)
Total Protein: 7.2 g/dL (ref 6.0–8.3)

## 2020-02-18 LAB — VITAMIN B12: Vitamin B-12: 528 pg/mL (ref 211–911)

## 2020-02-18 LAB — SEDIMENTATION RATE: Sed Rate: 13 mm/hr (ref 0–30)

## 2020-02-18 LAB — HIGH SENSITIVITY CRP: CRP, High Sensitivity: 10.74 mg/L — ABNORMAL HIGH (ref 0.000–5.000)

## 2020-02-18 LAB — VITAMIN D 25 HYDROXY (VIT D DEFICIENCY, FRACTURES): VITD: 31.42 ng/mL (ref 30.00–100.00)

## 2020-02-18 NOTE — Assessment & Plan Note (Signed)
With lingering symptoms  Refer to post covid clinic

## 2020-02-18 NOTE — Patient Instructions (Signed)
Mole A mole is a colored (pigmented) growth on the skin. Moles are very common. They are usually harmless, but some moles can become cancerous over time. What are the causes? Moles are caused when pigmented skin cells grow together in clusters instead of spreading out in the skin as they normally do. The reason why the skin cells grow together in clusters is not known. What increases the risk? You are more likely to develop a mole if you:  Have family members who have moles.  Are white.  Have blond hair.  Are often outdoors and exposed to the sun.  Received phototherapy when you were a newborn baby.  Are female. What are the signs or symptoms? A mole may be:  [place] or black.  Flat or raised.  Smooth or wrinkled. How is this diagnosed? A mole is diagnosed with a skin exam. If your health care provider thinks a mole may be cancerous, all or part of the mole will be removed for testing (biopsy). How is this treated? Most moles are noncancerous (benign) and do not require treatment. If a mole is found to be cancerous, it will be removed. You may also choose to have a mole removed if it is causing pain or if you do not like the way it looks. Follow these instructions at home: General instructions   Every month, look for new moles and check your existing moles for changes. This is important because a change in a mole can mean that the mole has become cancerous.  ABCDE changes in a mole indicate that you should be evaluated by your health care provider. ABCDE stands for: ? Asymmetry. This means the mole has an irregular shape. It is not round or oval. ? Border. This means the mole has an irregular or bumpy border. ? Color. This means the mole has multiple colors in it, including brown, black, blue, red, or tan. Note that it is normal for moles to get darker when a woman is pregnant or takes birth control pills. ? Diameter. This means the mole is more than 0.2 inches (6 mm)  across. ? Evolving. This refers to any unusual changes or symptoms in the mole, such as pain, itching, stinging, sensitivity, or bleeding.  If you have a large number of moles, see a skin doctor (dermatologist) at least one time every year for a full-body skin check. Lifestyle   When you are outdoors, wear sunscreen with SPF 30 (sun protection factor 30) or higher.  Use an adequate amount of sunscreen to cover exposed areas of skin. Put it on 30 minutes before you go out. Reapply it every 2 hours or anytime you come out of the water.  When you are out in the sun, wear a broad-brimmed hat and clothing that covers your arms and legs. Wear wraparound sunglasses. Contact a health care provider if:  The size, shape, borders, or color of your mole changes.  Your mole, or the skin near the mole, becomes painful, sore, red, or swollen.  Your mole: ? Develops more than one color. ? Itches or bleeds. ? Becomes scaly, sheds skin, or oozes fluid. ? Becomes flat or develops raised areas. ? Becomes hard or soft.  You develop a new mole. Summary  A mole is a colored (pigmented) growth on the skin. Moles are very common. They are usually harmless, but some moles can become cancerous over time.  Every month, look for new moles and check your existing moles for changes. This is important  because a change in a mole can mean that the mole has become cancerous.  If you have a large number of moles, see a skin doctor (dermatologist) at least one time every year for a full-body skin check.  When you are outdoors, wear sunscreen with SPF 30 (sun protection factor 30) or higher. Reapply it every 2 hours or anytime you come out of the water.  Contact a health care provider if you notice changes in a mole or if you develop a new mole. This information is not intended to replace advice given to you by your health care provider. Make sure you discuss any questions you have with your health care  provider. Document Revised: 03/19/2019 Document Reviewed: 01/07/2018 Elsevier Patient Education  [place].

## 2020-02-18 NOTE — Progress Notes (Signed)
Patient ID: Kimberly [name], female    DOB: October 05, 1960  Age: 59 y.o. MRN: [id number]    Subjective:  Subjective  HPI Kimberly [name] presents for c/o spot on bottom R foot that she noticed about 2 weeks ago --- non tender  She then also c/o   Review of Systems  Constitutional: Positive for fatigue. Negative for appetite change, diaphoresis and unexpected weight change.  Eyes: Negative for pain, redness and visual disturbance.  Respiratory: Positive for chest tightness and shortness of breath. Negative for cough and wheezing.   Cardiovascular: Negative for chest pain, palpitations and leg swelling.  Endocrine: Negative for cold intolerance, heat intolerance, polydipsia, polyphagia and polyuria.  Genitourinary: Negative for difficulty urinating, dysuria and frequency.  Musculoskeletal: Positive for myalgias.  Skin: Positive for color change.  Neurological: Negative for dizziness, light-headedness, numbness and headaches.    History Past Medical History:  Diagnosis Date  . Anxiety    takes Ativan as needed  . Bronchitis    hx of;last time > 22yrago  . Cancer ([facility]    part of left lower lung removed- lung ca 2013  . Colitis   . Colon polyps    hx of  . Complication of anesthesia    gas and MAC procedures  . Depression    takes Wellbutrin daily  . Diabetes mellitus    Novolog and Levemir daily  [place]   . Eczema   . GERD (gastroesophageal reflux disease)    takes Protonix nightly  . Headache(784.0)    stress HA frequently  . IBS (irritable bowel syndrome)   . Lung mass    left upper lobe  . Pneumonia    walking pneumonia in 2007  . PONV (postoperative nausea and vomiting)   . Shortness of breath    certain times of the year  . Stress incontinence     She has a past surgical history that includes CAESAREAN SECTION (96/98/2000); Rectal fistula (1993); Uterine fibroid surgery; colonosocpy; Biopsy thyroid; and Thoracotomy (10/26/2011).    Her family history includes Anesthesia problems in her sister; Breast cancer in her maternal aunt and another family member; Cancer in her father, maternal grandfather, and mother; Diabetes in her mother; Heart disease in her maternal grandfather and maternal grandmother; Hyperlipidemia in her maternal grandfather and mother; Migraines in her sister.She reports that she quit smoking about 37 years ago. Her smoking use included cigarettes. She has never used smokeless tobacco. She reports that she does not drink alcohol and does not use drugs.  Current Outpatient Medications on File Prior to Visit  Medication Sig Dispense Refill  . ACCU-CHEK FASTCLIX LANCETS MISC   0  . ACCU-CHEK SMARTVIEW test strip TEST 4 TO 5 TIMES PER DAY 100 each 11  . albuterol (PROVENTIL) (2.5 MG/3ML) 0.083% nebulizer solution Take 3 mLs (2.5 mg total) by nebulization every 6 (six) hours as needed for wheezing or shortness of breath. 150 mL 1  . buPROPion (WELLBUTRIN XL) 150 MG 24 hr tablet TAKE 1 TABLET(150 MG) BY MOUTH EVERY EVENING.  Need ov before any more refills. (Patient taking differently: Take 150 mg by mouth every evening. ) 90 tablet 0  . Continuous Blood Gluc Sensor (FREESTYLE LIBRE 2 SENSOR) MISC INJECT 1 SENSOR INTO SKIN EVERY 14 DAYS    . insulin aspart (NOVOLOG FLEXPEN) 100 UNIT/ML injection Inject 1-20 Units into the skin 3 (three) times daily before meals. Sliding scale    . LANTUS SOLOSTAR 100  UNIT/ML Solostar Pen ADMINISTER 50 UNITS UNDER THE SKIN IN THE MORNING AND AT BEDTIME 45 mL 1  . LORazepam (ATIVAN) 0.5 MG tablet Take 1 tablet (0.5 mg total) by mouth 2 (two) times daily as needed. for anxiety 60 tablet 1  . metFORMIN (GLUCOPHAGE-XR) 500 MG 24 hr tablet Take 1,000 mg by mouth 2 (two) times daily. 2 in the AM and 1 in the PM    . ondansetron (ZOFRAN ODT) 4 MG disintegrating tablet 55m ODT q8 hours prn nausea/vomit 4 tablet 0  . pantoprazole (PROTONIX) 40 MG tablet TAKE 1 TABLET(40 MG) BY MOUTH TWICE  DAILY.  Needs ov before any more refills 180 tablet 2   No current facility-administered medications on file prior to visit.     Objective:  Objective  Physical Exam Vitals and nursing note reviewed.  Constitutional:      Appearance: She is well-developed.  HENT:     Head: Normocephalic and atraumatic.  Eyes:     Conjunctiva/sclera: Conjunctivae normal.  Neck:     Thyroid: No thyromegaly.     Vascular: No carotid bruit or JVD.  Cardiovascular:     Rate and Rhythm: Normal rate and regular rhythm.     Heart sounds: Normal heart sounds. No murmur heard.   Pulmonary:     Effort: Pulmonary effort is normal. No respiratory distress.     Breath sounds: Normal breath sounds. No wheezing or rales.  Chest:     Chest wall: No tenderness.  Musculoskeletal:     Cervical back: Normal range of motion and neck supple.       Feet:  Skin:    Findings: Lesion present.  Neurological:     Mental Status: She is alert and oriented to person, place, and time.    BP (!) 147/69 (BP Location: Left Arm, Patient Position: Sitting, Cuff Size: Small)   Pulse 90   Temp 97.7 F (36.5 C) (Temporal)   Resp 16   Ht _0  (1.778 m)   Wt 274 lb (124.3 kg)   SpO2 99%   BMI 39.31 kg/m  Wt Readings from Last 3 Encounters:  02/18/20 274 lb (124.3 kg)  02/05/20 277 lb (125.6 kg)  11/30/19 271 lb (122.9 kg)     Lab Results  Component Value Date   WBC 3.6 (L) 02/18/2020   HGB 13.6 02/18/2020   HCT 40.5 02/18/2020   PLT 113.0 (L) 02/18/2020   GLUCOSE 217 (H) 02/18/2020   ALT 24 02/18/2020   AST 24 02/18/2020   NA 133 (L) 02/18/2020   K 4.2 02/18/2020   CL 105 02/18/2020   CREATININE 0.70 02/18/2020   BUN 10 02/18/2020   CO2 33 (H) 02/18/2020   TSH 0.56 03/05/2008   INR 1.2 10/19/2019   HGBA1C 7.7 (A) 02/05/2020   MICROALBUR 1.5 04/10/2018   ekg-- nsr   CT Renal Stone Study  Result Date: 11/12/2019 CLINICAL DATA:  Left side flank pain EXAM: CT ABDOMEN AND PELVIS WITHOUT CONTRAST  TECHNIQUE: Multidetector CT imaging of the abdomen and pelvis was performed following the standard protocol without IV contrast. COMPARISON:  08/25/2011 FINDINGS: Lower chest: Scarring in the lung bases.  No acute abnormality. Hepatobiliary: Layering gallstones within the gallbladder. Nodular contours of the liver compatible with cirrhosis. No visible focal abnormality. Pancreas: No focal abnormality or ductal dilatation. Spleen: Splenomegaly with craniocaudal length 16.7 cm. No focal abnormality. Adrenals/Urinary Tract: No adrenal abnormality. No focal renal abnormality. No stones or hydronephrosis. Urinary bladder is unremarkable. Stomach/Bowel:  Moderate stool burden. Stomach, large and small bowel grossly unremarkable. Vascular/Lymphatic: No evidence of aneurysm or adenopathy. Reproductive: Uterus and adnexa unremarkable.  No mass. Other: No free fluid or free air. Musculoskeletal: No acute bony abnormality. IMPRESSION: No evidence of renal or ureteral stones.  No hydronephrosis. Nodular contours of the liver compatible with cirrhosis. Associated splenomegaly. Cholelithiasis Moderate stool burden. Electronically Signed   By: Rolm Baptise M.D.   On: 11/12/2019 08:07     Assessment & Plan:  Plan  I am having Dorotea L. Bracco maintain her insulin aspart, Accu-Chek FastClix Lancets, albuterol, Accu-Chek SmartView, buPROPion, LORazepam, metFORMIN, pantoprazole, ondansetron, FreeStyle Libre 2 Sensor, and Lantus SoloStar.  No orders of the defined types were placed in this encounter.   Problem List Items Addressed This Visit      Unprioritized   Chest pain   Relevant Orders   CBC with Differential/Platelet (Completed)   Vitamin B12 (Completed)   Comprehensive metabolic panel (Completed)   Vitamin D (25 hydroxy) (Completed)   Thyroid Panel With TSH   EKG 12-Lead (Completed)   Fatigue    Since having covid pt has had lingering symptoms , feels terrible and has not been able to go to work         Relevant Orders   CBC with Differential/Platelet (Completed)   Vitamin B12 (Completed)   Comprehensive metabolic panel (Completed)   Vitamin D (25 hydroxy) (Completed)   Thyroid Panel With TSH   Sedimentation rate (Completed)   CRP High sensitivity (Completed)   Antinuclear Antib (ANA)   Rheumatoid Factor   History of COVID-19    With lingering symptoms  Refer to post covid clinic       Relevant Orders   EKG 12-Lead (Completed)   Lesion of skin of foot    ? Collection of blood vs mole --- difficult to tell on exam but non tender  Refer to podiatry      Relevant Orders   Ambulatory referral to Podiatry   Memory problem   Relevant Orders   CBC with Differential/Platelet (Completed)   Vitamin B12 (Completed)   Comprehensive metabolic panel (Completed)   Vitamin D (25 hydroxy) (Completed)   Thyroid Panel With TSH   Myalgia - Primary    S/p covid  Will refer to post covid clinic       Relevant Orders   CBC with Differential/Platelet (Completed)   Vitamin B12 (Completed)   Comprehensive metabolic panel (Completed)   Vitamin D (25 hydroxy) (Completed)   Thyroid Panel With TSH   Sedimentation rate (Completed)   CRP High sensitivity (Completed)   Antinuclear Antib (ANA)   Rheumatoid Factor   Other fatigue   Relevant Orders   CBC with Differential/Platelet (Completed)   Vitamin B12 (Completed)   Comprehensive metabolic panel (Completed)   Vitamin D (25 hydroxy) (Completed)   Thyroid Panel With TSH   Sedimentation rate (Completed)   CRP High sensitivity (Completed)   Antinuclear Antib (ANA)   Rheumatoid Factor      Follow-up: No follow-ups on file.  [name], DO

## 2020-02-18 NOTE — Assessment & Plan Note (Signed)
Since having covid pt has had lingering symptoms , feels terrible and has not been able to go to work

## 2020-02-18 NOTE — Assessment & Plan Note (Signed)
?   Collection of blood vs mole --- difficult to tell on exam but non tender  Refer to podiatry

## 2020-02-18 NOTE — Assessment & Plan Note (Signed)
S/p covid  Will refer to post covid clinic

## 2020-02-19 ENCOUNTER — Telehealth: Payer: Self-pay | Admitting: Family Medicine

## 2020-02-19 NOTE — Telephone Encounter (Signed)
[facility] Call back phone number: [phone]  Patient would like to discuss labs

## 2020-02-23 ENCOUNTER — Telehealth: Payer: Self-pay

## 2020-02-23 ENCOUNTER — Telehealth: Payer: Self-pay | Admitting: *Deleted

## 2020-02-23 LAB — ANA: Anti Nuclear Antibody (ANA): NEGATIVE

## 2020-02-23 LAB — THYROID PANEL WITH TSH
Free Thyroxine Index: 2.5 (ref 1.4–3.8)
T3 Uptake: 27 % (ref 22–35)
T4, Total: 9.2 ug/dL (ref 5.1–11.9)
TSH: 0.97 mIU/L (ref 0.40–4.50)

## 2020-02-23 LAB — RHEUMATOID FACTOR: Rheumatoid fact SerPl-aCnc: 15 IU/mL — ABNORMAL HIGH (ref <14)

## 2020-02-23 NOTE — Telephone Encounter (Signed)
Pt viewed [facility] recommendations for labs via [facility]

## 2020-02-23 NOTE — Telephone Encounter (Signed)
Covid clinic was closed this week due to no provider available. Was able to talk to the director and got patient scheduled for 03-02-2020. Patient said she may change the appointment to a day she is off.

## 2020-02-23 NOTE — Telephone Encounter (Signed)
Call received from patient wanting to know if labs drawn from Dr. Nonda Lou office on 02/18/20 will be sufficient for the lab work that Dr. Marin Olp would like drawn on 02/25/20.  Dr. Marin Olp reviewed lab work drawn on 02/18/20 and states that lab work is sufficient for 02/25/20 appt.  Call placed back to patient and message left to nofity pt that lab appt will be canceled for 02/25/20 per order of Dr. Marin Olp.  Instructed pt to call office back with any questions or concerns.

## 2020-02-25 ENCOUNTER — Encounter: Payer: Self-pay | Admitting: Hematology & Oncology

## 2020-02-25 ENCOUNTER — Telehealth: Payer: Self-pay | Admitting: *Deleted

## 2020-02-25 ENCOUNTER — Ambulatory Visit (HOSPITAL_BASED_OUTPATIENT_CLINIC_OR_DEPARTMENT_OTHER)
Admission: RE | Admit: 2020-02-25 | Discharge: 2020-02-25 | Disposition: A | Discharge status: Home / Self Care | Payer: 59 | Source: Ambulatory Visit | Attending: Hematology & Oncology | Admitting: Hematology & Oncology

## 2020-02-25 ENCOUNTER — Inpatient Hospital Stay: Payer: 59 | Attending: Hematology & Oncology | Admitting: Hematology & Oncology

## 2020-02-25 ENCOUNTER — Encounter (HOSPITAL_BASED_OUTPATIENT_CLINIC_OR_DEPARTMENT_OTHER): Payer: Self-pay

## 2020-02-25 ENCOUNTER — Inpatient Hospital Stay: Payer: 59

## 2020-02-25 ENCOUNTER — Other Ambulatory Visit: Payer: Self-pay

## 2020-02-25 VITALS — BP 153/75 | HR 79 | Temp 96.8°F | Resp 18 | Wt 272.0 lb

## 2020-02-25 DIAGNOSIS — Z8616 Personal history of COVID-19: Secondary | ICD-10-CM

## 2020-02-25 DIAGNOSIS — R161 Splenomegaly, not elsewhere classified: Secondary | ICD-10-CM | POA: Insufficient documentation

## 2020-02-25 DIAGNOSIS — K7581 Nonalcoholic steatohepatitis (NASH): Secondary | ICD-10-CM | POA: Diagnosis present

## 2020-02-25 MED ORDER — IOHEXOL 300 MG/ML  SOLN
100.0000 mL | Freq: Once | INTRAMUSCULAR | Status: AC | PRN
  Administered 2020-02-25: 80 mL via INTRAVENOUS

## 2020-02-25 NOTE — Progress Notes (Signed)
Hematology and Oncology Follow Up Visit  Kimberly [name] [id number] May 14, 1961 59 y.o. 02/25/2020   Principle Diagnosis:   Chronic thrombocytopenia-NASH cirrhosis/splenomegaly  Current Therapy:    Observation     Interim History:  Kimberly [name] is back for her second office visit.  We first saw her back in early April.  At that time, I felt that her thrombocytopenia, which was quite mild, was secondary to her having NASH.  At the time, she was getting over the Covid-19 infection.  She was quite sick from this.  She had a CT scan that was done back in the March which showed some enlarged lymph nodes in the mediastinum.  Because of the COVID-19 infection, we needed to follow-up with the CT scan to make sure that these lymph nodes and pulmonary inflammation resolved.  The CT scan was done today.  Thankfully, the mediastinal and hilar lymph nodes had resolved.  The pneumonitis change in the lungs also had resolved.  As such, she is having very nice resolution of the COVID-19 infection.  She has not had any bleeding.  There is no bruising.  She is had no fever.  There is no rashes.  There is no change in bowel or bladder habits.  She has had no leg swelling.  Overall, her performance status is ECOG 0.  Medications:  Current Outpatient Medications:    ACCU-CHEK FASTCLIX LANCETS MISC, , Disp: , Rfl: 0   ACCU-CHEK SMARTVIEW test strip, TEST 4 TO 5 TIMES PER DAY, Disp: 100 each, Rfl: 11   albuterol (PROVENTIL) (2.5 MG/3ML) 0.083% nebulizer solution, Take 3 mLs (2.5 mg total) by nebulization every 6 (six) hours as needed for wheezing or shortness of breath., Disp: 150 mL, Rfl: 1   buPROPion (WELLBUTRIN XL) 150 MG 24 hr tablet, TAKE 1 TABLET(150 MG) BY MOUTH EVERY EVENING.  Need ov before any more refills. (Patient taking differently: Take 150 mg by mouth every evening. ), Disp: 90 tablet, Rfl: 0   Continuous Blood Gluc Sensor (FREESTYLE LIBRE 2 SENSOR) MISC, INJECT 1 SENSOR INTO SKIN EVERY  14 DAYS, Disp: , Rfl:    insulin aspart (NOVOLOG FLEXPEN) 100 UNIT/ML injection, Inject 1-20 Units into the skin 3 (three) times daily before meals. Sliding scale, Disp: , Rfl:    LANTUS SOLOSTAR 100 UNIT/ML Solostar Pen, ADMINISTER 50 UNITS UNDER THE SKIN IN THE MORNING AND AT BEDTIME, Disp: 45 mL, Rfl: 1   LORazepam (ATIVAN) 0.5 MG tablet, Take 1 tablet (0.5 mg total) by mouth 2 (two) times daily as needed. for anxiety, Disp: 60 tablet, Rfl: 1   metFORMIN (GLUCOPHAGE-XR) 500 MG 24 hr tablet, Take 1,000 mg by mouth 2 (two) times daily. 2 in the AM and 1 in the PM, Disp: , Rfl:    ondansetron (ZOFRAN ODT) 4 MG disintegrating tablet, 58m ODT q8 hours prn nausea/vomit, Disp: 4 tablet, Rfl: 0   pantoprazole (PROTONIX) 40 MG tablet, TAKE 1 TABLET(40 MG) BY MOUTH TWICE DAILY.  Needs ov before any more refills, Disp: 180 tablet, Rfl: 2  Allergies:  Allergies  Allergen Reactions   Codeine Nausea And Vomiting    migraines   Diflucan [Fluconazole] Other (See Comments)    Blisters   Metoclopramide Hcl Other (See Comments)    Do not give at all;muscle jerking   Penicillins Swelling    REACTION: swelling hands   Prednisone Nausea And Vomiting    migraines    Past Medical History, Surgical history, Social history, and Family History were reviewed  and updated.  Review of Systems: Review of Systems  Constitutional: Negative.   HENT:  Negative.   Eyes: Negative.   Respiratory: Negative.   Cardiovascular: Negative.   Gastrointestinal: Negative.   Endocrine: Negative.   Genitourinary: Negative.    Musculoskeletal: Negative.   Skin: Negative.   Neurological: Negative.   Hematological: Negative.   Psychiatric/Behavioral: Negative.     Physical Exam:  weight is 272 lb (123.4 kg). Her temporal temperature is 96.8 F (36 C) (abnormal). Her blood pressure is 153/75 (abnormal) and her pulse is 79. Her respiration is 18 and oxygen saturation is 100%.   Wt Readings from Last 3  Encounters:  02/25/20 272 lb (123.4 kg)  02/18/20 274 lb (124.3 kg)  02/05/20 277 lb (125.6 kg)    Physical Exam Vitals reviewed.  HENT:     Head: Normocephalic and atraumatic.  Eyes:     Pupils: Pupils are equal, round, and reactive to light.  Cardiovascular:     Rate and Rhythm: Normal rate and regular rhythm.     Heart sounds: Normal heart sounds.  Pulmonary:     Effort: Pulmonary effort is normal.     Breath sounds: Normal breath sounds.  Abdominal:     General: Bowel sounds are normal.     Palpations: Abdomen is soft.  Musculoskeletal:        General: No tenderness or deformity. Normal range of motion.     Cervical back: Normal range of motion.  Lymphadenopathy:     Cervical: No cervical adenopathy.  Skin:    General: Skin is warm and dry.     Findings: No erythema or rash.  Neurological:     Mental Status: She is alert and oriented to person, place, and time.  Psychiatric:        Behavior: Behavior normal.        Thought Content: Thought content normal.        Judgment: Judgment normal.      Lab Results  Component Value Date   WBC 3.6 (L) 02/18/2020   HGB 13.6 02/18/2020   HCT 40.5 02/18/2020   MCV 83.3 02/18/2020   PLT 113.0 (L) 02/18/2020     Chemistry      Component Value Date/Time   NA 133 (L) 02/18/2020 1106   K 4.2 02/18/2020 1106   CL 105 02/18/2020 1106   CO2 33 (H) 02/18/2020 1106   BUN 10 02/18/2020 1106   CREATININE 0.70 02/18/2020 1106   CREATININE 0.71 11/30/2019 1048   CREATININE 0.82 12/05/2017 1411      Component Value Date/Time   CALCIUM 10.2 02/18/2020 1106   ALKPHOS 136 (H) 02/18/2020 1106   AST 24 02/18/2020 1106   AST 26 11/30/2019 1048   ALT 24 02/18/2020 1106   ALT 32 11/30/2019 1048   BILITOT 0.6 02/18/2020 1106   BILITOT 0.7 11/30/2019 1048      Impression and Plan: Kimberly [name] is a very nice 59 year old Y female.  She has thrombocytopenia.  This is stable.  I looked at her blood smear under the microscope.  I do  not see anything with the blood smear that suggested a hematologic malignancy.  Her platelets were a little on the large size.  She has well granulated platelets.  I am just glad that the CT scan changes that were noted previously have not resolved.  Again this is all secondary to the COVID-19 infection that she had.  At this point, I think we just follow her  along.  I do not see any intervention that we have to do for the thrombocytopenia.  She is asymptomatic with this.  We will plan to get her back after the summer and Labor Day.  If she has any problems with bleeding or bruising, she can always give [place] a call.   Volanda Napoleon, MD 7/1/20216:00 PM

## 2020-02-25 NOTE — Telephone Encounter (Signed)
As noted below by Dr. Marin Olp, I informed the patient that the lymph nodes have resolved and the lung inflammation is gone. Everything is normal. She verbalized understanding.

## 2020-02-25 NOTE — Telephone Encounter (Signed)
-----   Message from Volanda Napoleon, MD sent at 02/25/2020  3:57 PM EDT ----- Call - lymph nodes have resolved and  lung inflammation is gone!!  Everything is now back to normal!!  Pete

## 2020-03-02 ENCOUNTER — Other Ambulatory Visit (HOSPITAL_BASED_OUTPATIENT_CLINIC_OR_DEPARTMENT_OTHER): Payer: 59

## 2020-03-02 ENCOUNTER — Ambulatory Visit: Payer: 59 | Admitting: Hematology & Oncology

## 2020-03-02 ENCOUNTER — Other Ambulatory Visit: Payer: 59

## 2020-03-07 ENCOUNTER — Encounter: Payer: Self-pay | Admitting: Family Medicine

## 2020-03-08 NOTE — Telephone Encounter (Signed)
Can she get a copy of the letter to [place]--- we need info on the letter to write one for her  Or just get [place] the jury number and date she is supposed to appear

## 2020-03-08 NOTE — Telephone Encounter (Signed)
Please advise 

## 2020-03-10 ENCOUNTER — Encounter: Payer: Self-pay | Admitting: Family Medicine

## 2020-03-10 NOTE — Telephone Encounter (Signed)
Note is in epic

## 2020-03-10 NOTE — Telephone Encounter (Signed)
Pt has attached summons.

## 2020-03-15 ENCOUNTER — Ambulatory Visit: Payer: 59 | Admitting: Sports Medicine

## 2020-03-17 ENCOUNTER — Ambulatory Visit (INDEPENDENT_AMBULATORY_CARE_PROVIDER_SITE_OTHER): Payer: 59 | Admitting: Nurse Practitioner

## 2020-03-17 ENCOUNTER — Other Ambulatory Visit: Payer: Self-pay

## 2020-03-17 VITALS — BP 132/78 | HR 77 | Temp 97.3°F | Ht 70.0 in | Wt 177.0 lb

## 2020-03-17 DIAGNOSIS — M791 Myalgia, unspecified site: Secondary | ICD-10-CM

## 2020-03-17 DIAGNOSIS — R5383 Other fatigue: Secondary | ICD-10-CM | POA: Diagnosis not present

## 2020-03-17 DIAGNOSIS — R768 Other specified abnormal immunological findings in serum: Secondary | ICD-10-CM

## 2020-03-17 DIAGNOSIS — Z8616 Personal history of COVID-19: Secondary | ICD-10-CM | POA: Diagnosis not present

## 2020-03-17 DIAGNOSIS — M2550 Pain in unspecified joint: Secondary | ICD-10-CM

## 2020-03-17 DIAGNOSIS — R7982 Elevated C-reactive protein (CRP): Secondary | ICD-10-CM

## 2020-03-17 DIAGNOSIS — R5381 Other malaise: Secondary | ICD-10-CM

## 2020-03-17 DIAGNOSIS — L659 Nonscarring hair loss, unspecified: Secondary | ICD-10-CM

## 2020-03-17 DIAGNOSIS — R413 Other amnesia: Secondary | ICD-10-CM

## 2020-03-17 NOTE — Progress Notes (Signed)
_0  ID: Kimberly [name], female    DOB: Feb 18, 1961, 59 y.o.   MRN: [id number]  Chief Complaint  Patient presents with  . [place]; Tested postive 09/2019. Still having some sx: Body ache, body ache, chest discomfort, fatigue    Referring provider: Ann [name], *   59 year old female former smoker with history of migraines, diabetes type 2, GERD, fatty liver disease, depression and anxiety.  Diagnosed with Covid on October 19, 2019.  HPI  Patient presents today for post Covid care clinic visit.  Patient states that she was diagnosed with Covid on February 22 and did receive monoclonal antibody infusion.  She states that she has continued to have ongoing multiple joint pain and muscle pain since having Covid.  She also complains of hair loss that is improving, memory loss, fatigue.  Patient states that her fatigue is severe and that she can make it through the grocery store without having to rest.  She is trying to stay as active as possible.  She has been able to work at home for the past several months.  Patient recently had complete lab work-up by PCP and inflammatory markers were elevated.  Patient has not had a referral placed to rheumatology yet. Denies f/c/s, n/v/d, hemoptysis, PND, chest pain or edema.        Allergies  Allergen Reactions  . Codeine Nausea And Vomiting    migraines  . Diflucan [Fluconazole] Other (See Comments)    Blisters  . Metoclopramide Hcl Other (See Comments)    Do not give at all;muscle jerking  . Penicillins Swelling    REACTION: swelling hands  . Prednisone Nausea And Vomiting    migraines    Immunization History  Administered Date(s) Administered  . Td 09/22/2001    Past Medical History:  Diagnosis Date  . Anxiety    takes Ativan as needed  . Bronchitis    hx of;last time > 22yrago  . Cancer ([facility]    part of left lower lung removed- lung ca 2013  . Colitis   . Colon polyps    hx of  .  Complication of anesthesia    gas and MAC procedures  . Depression    takes Wellbutrin daily  . Diabetes mellitus    Novolog and Levemir daily  [place]   . Eczema   . GERD (gastroesophageal reflux disease)    takes Protonix nightly  . Headache(784.0)    stress HA frequently  . IBS (irritable bowel syndrome)   . Lung mass    left upper lobe  . Pneumonia    walking pneumonia in 2007  . PONV (postoperative nausea and vomiting)   . Shortness of breath    certain times of the year  . Stress incontinence     Tobacco History: Social History   Tobacco Use  Smoking Status Former Smoker  . Types: Cigarettes  . Quit date: 08/27/1982  . Years since quitting: 37.5  Smokeless Tobacco Never Used   Counseling given: Not Answered   Outpatient Encounter Medications as of 03/17/2020  Medication Sig  . ACCU-CHEK FASTCLIX LANCETS MISC   . ACCU-CHEK SMARTVIEW test strip TEST 4 TO 5 TIMES PER DAY  . albuterol (PROVENTIL) (2.5 MG/3ML) 0.083% nebulizer solution Take 3 mLs (2.5 mg total) by nebulization every 6 (six) hours as needed for wheezing or shortness of breath.  .Marland KitchenbuPROPion (WELLBUTRIN XL) 150 MG  24 hr tablet TAKE 1 TABLET(150 MG) BY MOUTH EVERY EVENING.  Need ov before any more refills. (Patient taking differently: Take 150 mg by mouth every evening. )  . Continuous Blood Gluc Sensor (FREESTYLE LIBRE 2 SENSOR) MISC INJECT 1 SENSOR INTO SKIN EVERY 14 DAYS  . insulin aspart (NOVOLOG FLEXPEN) 100 UNIT/ML injection Inject 1-20 Units into the skin 3 (three) times daily before meals. Sliding scale  . LANTUS SOLOSTAR 100 UNIT/ML Solostar Pen ADMINISTER 50 UNITS UNDER THE SKIN IN THE MORNING AND AT BEDTIME  . LORazepam (ATIVAN) 0.5 MG tablet Take 1 tablet (0.5 mg total) by mouth 2 (two) times daily as needed. for anxiety  . metFORMIN (GLUCOPHAGE-XR) 500 MG 24 hr tablet Take 1,000 mg by mouth 2 (two) times daily. 2 in the AM and 1 in the PM  . ondansetron (ZOFRAN  ODT) 4 MG disintegrating tablet 78m ODT q8 hours prn nausea/vomit  . pantoprazole (PROTONIX) 40 MG tablet TAKE 1 TABLET(40 MG) BY MOUTH TWICE DAILY.  Needs ov before any more refills   No facility-administered encounter medications on file as of 03/17/2020.     Review of Systems  Review of Systems  Constitutional: Positive for fatigue. Negative for fever.  HENT: Negative.   Respiratory: Negative for cough and shortness of breath.   Cardiovascular: Negative.  Negative for chest pain, palpitations and leg swelling.  Gastrointestinal: Negative.   Musculoskeletal: Positive for arthralgias and myalgias.  Allergic/Immunologic: Negative.   Neurological: Positive for weakness.  Psychiatric/Behavioral: Positive for decreased concentration.       Physical Exam  BP (!) 132/78   Pulse 77   Temp (!) 97.3 F (36.3 C)   Ht _0  (1.778 m)   Wt 177 lb (80.3 kg)   SpO2 98%   BMI 25.40 kg/m   Wt Readings from Last 5 Encounters:  03/17/20 177 lb (80.3 kg)  02/25/20 272 lb (123.4 kg)  02/18/20 274 lb (124.3 kg)  02/05/20 277 lb (125.6 kg)  11/30/19 271 lb (122.9 kg)     Physical Exam Vitals and nursing note reviewed.  Constitutional:      General: She is not in acute distress.    Appearance: She is well-developed.  Cardiovascular:     Rate and Rhythm: Normal rate and regular rhythm.  Pulmonary:     Effort: Pulmonary effort is normal.     Breath sounds: Normal breath sounds.  Musculoskeletal:     Right lower leg: No edema.     Left lower leg: No edema.  Neurological:     Mental Status: She is alert and oriented to person, place, and time.  Psychiatric:        Mood and Affect: Mood normal.        Behavior: Behavior normal.      Imaging: CT Chest W Contrast  Result Date: 02/25/2020 CLINICAL DATA:  Follow-up mediastinal adenopathy EXAM: CT CHEST WITH CONTRAST TECHNIQUE: Multidetector CT imaging of the chest was performed during intravenous contrast administration.  CONTRAST:  847mOMNIPAQUE IOHEXOL 300 MG/ML  SOLN COMPARISON:  10/19/2019 FINDINGS: Cardiovascular: Satisfactory opacification of the pulmonary arteries to the segmental level. No evidence of pulmonary embolism. Normal heart size. No pericardial effusion. Mediastinum/Nodes: Interval resolution of previously seen mediastinal and bilateral hilar lymphadenopathy. Thyroid gland, trachea, and esophagus demonstrate no significant findings. Lungs/Pleura: Interval resolution of previously seen extensive bilateral heterogeneous airspace disease. Status post wedge resection of the left lower lobe with adjacent scarring and or atelectasis. Upper Abdomen: No acute abnormality.  Hepatic steatosis. Musculoskeletal: No chest wall abnormality. No acute or significant osseous findings. Review of the MIP images confirms the above findings. IMPRESSION: 1. Interval resolution of previously seen mediastinal and bilateral hilar lymphadenopathy, consistent with resolution of reactive lymphadenopathy. 2. Interval resolution of previously seen extensive bilateral heterogeneous airspace disease, consistent with resolution of multifocal infection. 3. Status post wedge resection of the left lower lobe with adjacent scarring and or atelectasis. 4. Hepatic steatosis. Electronically Signed   By: Eddie Candle M.D.   On: 02/25/2020 13:38     Assessment & Plan:   History of COVID-19 History of Covid Multiple joint pain Muscle pain Fatigue:  Inflammatory markers checked in PCP office were elevated - Will place referral to rheumatology  Will place referral to PMR for further evaluation related to pain and for PT  Stay active  May try gentle flow yoga   Memory loss:  Hand out given on memory care tips  If this does not improve with time or becomes worse will refer to neurology  Hair Loss:  This is normal after Covid - may last 3-6 months  Follow up:  Follow up in 2 months or sooner if needed     Fenton Foy,  NP 03/18/2020

## 2020-03-17 NOTE — Patient Instructions (Addendum)
History of Covid Multiple joint pain Muscle pain Fatigue:  Inflammatory markers checked in PCP office were elevated - Will place referral to rheumatology  Will place referral to PMR for further evaluation related to pain and for PT  Stay active  May try gentle flow yoga   Memory loss:  Hand out given on memory care tips  If this does not improve with time or becomes worse will refer to neurology  Hair Loss:  This is normal after Covid - may last 3-6 months  Follow up:  Follow up in 2 months or sooner if needed

## 2020-03-18 DIAGNOSIS — L659 Nonscarring hair loss, unspecified: Secondary | ICD-10-CM | POA: Insufficient documentation

## 2020-03-18 DIAGNOSIS — M2550 Pain in unspecified joint: Secondary | ICD-10-CM | POA: Insufficient documentation

## 2020-03-18 DIAGNOSIS — R7982 Elevated C-reactive protein (CRP): Secondary | ICD-10-CM | POA: Insufficient documentation

## 2020-03-18 DIAGNOSIS — R768 Other specified abnormal immunological findings in serum: Secondary | ICD-10-CM | POA: Insufficient documentation

## 2020-03-18 NOTE — Assessment & Plan Note (Signed)
History of Covid Multiple joint pain Muscle pain Fatigue:  Inflammatory markers checked in PCP office were elevated - Will place referral to rheumatology  Will place referral to PMR for further evaluation related to pain and for PT  Stay active  May try gentle flow yoga   Memory loss:  Hand out given on memory care tips  If this does not improve with time or becomes worse will refer to neurology  Hair Loss:  This is normal after Covid - may last 3-6 months  Follow up:  Follow up in 2 months or sooner if needed

## 2020-04-01 ENCOUNTER — Telehealth: Payer: Self-pay | Admitting: Internal Medicine

## 2020-04-01 ENCOUNTER — Encounter: Payer: Self-pay | Admitting: Physical Medicine and Rehabilitation

## 2020-04-01 ENCOUNTER — Other Ambulatory Visit: Payer: Self-pay

## 2020-04-01 DIAGNOSIS — E1142 Type 2 diabetes mellitus with diabetic polyneuropathy: Secondary | ICD-10-CM

## 2020-04-01 DIAGNOSIS — E1165 Type 2 diabetes mellitus with hyperglycemia: Secondary | ICD-10-CM

## 2020-04-01 MED ORDER — INSULIN ASPART 100 UNIT/ML ~~LOC~~ SOLN: SUBCUTANEOUS | 0 refills | Status: DC

## 2020-04-01 MED ORDER — METFORMIN HCL ER 500 MG PO TB24: 1000.0000 mg | ORAL_TABLET | Freq: Two times a day (BID) | ORAL | 0 refills | Status: DC

## 2020-04-01 NOTE — Telephone Encounter (Signed)
Medication Refill Request  . Did you call your pharmacy and request this refill first? no, advised for future  . If patient has not contacted pharmacy first, instruct them to do so for future refills.  . Remind them that contacting the pharmacy for their refill is the quickest method to get the refill.  . Refill policy also stated that it will take anywhere between 24-72 hours to receive the refill.    Name of medication? Metformin ER 500 MG     Novolog Flexpen 100 Unit - 30 unit before meals up to 100 unit/day  Is this a 90 day supply? yes  Name and location of pharmacy? [facility] [address]

## 2020-04-01 NOTE — Telephone Encounter (Signed)
E-Prescribing Status: Receipt confirmed by pharmacy (04/01/2020  1:59 PM EDT)

## 2020-04-04 ENCOUNTER — Other Ambulatory Visit: Payer: Self-pay

## 2020-04-04 MED ORDER — ACCU-CHEK GUIDE VI STRP: ORAL_STRIP | 12 refills | Status: DC

## 2020-04-05 ENCOUNTER — Telehealth: Payer: Self-pay

## 2020-04-05 NOTE — Telephone Encounter (Signed)
Received notification from [facility] indicating denial for Tier Exception for [employer].

## 2020-04-12 MED ORDER — INSULIN ASPART 100 UNIT/ML ~~LOC~~ SOLN
18.0000 [IU] | Freq: Three times a day (TID) | SUBCUTANEOUS | 1 refills | Status: DC
Start: 2020-04-12 — End: 2020-11-03

## 2020-04-12 NOTE — Telephone Encounter (Signed)
Patient wondering why her supply for Novolog was so much less than what she normally gets. States she does not think it will last her for the month. She said she only got 7 pens and normally she gets two boxes - usually her Rx says quantity 45 and this one says quantity 21. Please advise. [phone] (patient gives consent to leave message and she will call back on her break since she's about to go to work)

## 2020-04-12 NOTE — Telephone Encounter (Signed)
Changed to a 90 day and sent.

## 2020-04-12 NOTE — Addendum Note (Signed)
Addended by: Cardell Peach I on: 04/12/2020 10:49 AM   Modules accepted: Orders

## 2020-04-24 ENCOUNTER — Other Ambulatory Visit: Payer: Self-pay | Admitting: Internal Medicine

## 2020-04-25 DIAGNOSIS — E1165 Type 2 diabetes mellitus with hyperglycemia: Secondary | ICD-10-CM

## 2020-04-25 DIAGNOSIS — E1142 Type 2 diabetes mellitus with diabetic polyneuropathy: Secondary | ICD-10-CM

## 2020-05-05 ENCOUNTER — Telehealth: Payer: Self-pay | Admitting: Family Medicine

## 2020-05-05 NOTE — Telephone Encounter (Signed)
Caller: Brianny Call back # [phone]  Patient states she is dealing with some post covid symptoms. She still experiencing some headaches & nausea. Patient states she has something for her headaches but she needs something for nausea.  Patient is asking for ondansetron (ZOFRAN ODT) 4 MG disintegrating tablet [[id number]]  [id number] [place], [place] [address]  [address] [place] [address]  Phone:  [phone] Fax:  [phone]

## 2020-05-06 MED ORDER — ONDANSETRON 4 MG PO TBDP: ORAL_TABLET | ORAL | 2 refills | Status: DC

## 2020-05-06 NOTE — Telephone Encounter (Signed)
Refill sent.

## 2020-05-12 ENCOUNTER — Other Ambulatory Visit: Payer: Self-pay

## 2020-05-13 ENCOUNTER — Encounter: Payer: 59 | Attending: Physical Medicine and Rehabilitation | Admitting: Physical Medicine and Rehabilitation

## 2020-05-13 ENCOUNTER — Encounter: Payer: Self-pay | Admitting: Physical Medicine and Rehabilitation

## 2020-05-13 ENCOUNTER — Other Ambulatory Visit: Payer: Self-pay

## 2020-05-13 VITALS — BP 150/80 | HR 83 | Temp 98.9°F | Ht 70.0 in | Wt 278.4 lb

## 2020-05-13 DIAGNOSIS — R5381 Other malaise: Secondary | ICD-10-CM | POA: Insufficient documentation

## 2020-05-13 DIAGNOSIS — E1165 Type 2 diabetes mellitus with hyperglycemia: Secondary | ICD-10-CM | POA: Diagnosis present

## 2020-05-13 DIAGNOSIS — E1151 Type 2 diabetes mellitus with diabetic peripheral angiopathy without gangrene: Secondary | ICD-10-CM | POA: Diagnosis not present

## 2020-05-13 DIAGNOSIS — Z8616 Personal history of COVID-19: Secondary | ICD-10-CM | POA: Insufficient documentation

## 2020-05-13 DIAGNOSIS — IMO0002 Reserved for concepts with insufficient information to code with codable children: Secondary | ICD-10-CM

## 2020-05-13 DIAGNOSIS — E559 Vitamin D deficiency, unspecified: Secondary | ICD-10-CM | POA: Insufficient documentation

## 2020-05-13 MED ORDER — DICLOFENAC SODIUM 1 % EX GEL: 2.0000 g | Freq: Four times a day (QID) | CUTANEOUS | 3 refills | Status: DC

## 2020-05-13 MED ORDER — VITAMIN D (ERGOCALCIFEROL) 1.25 MG (50000 UNIT) PO CAPS: 50000.0000 [IU] | ORAL_CAPSULE | ORAL | 0 refills | Status: DC

## 2020-05-13 NOTE — Patient Instructions (Signed)
  1) Ginger 2) Blueberries 3) Salmon 4) Pumpkin seeds 5) dark chocolate 6) turmeric 7) tart cherries 8) virgin olive oil 9) chilli peppers 10) mint 11) red wine

## 2020-05-13 NOTE — Progress Notes (Signed)
Subjective:    Patient ID: Kimberly [name], female    DOB: 05-20-1961, 59 y.o.   MRN: [id number]  HPI  Kimberly [name] is a 59 year old woman who presents to establish care for Long COVID syndrome.   2 months after COVID diagnosis she started to experience joint pain and stiffness. When she is getting up and dow from a hair or a commode. She has to hold something to pull herself up. This is embarrassing to her.   Prior to Kimberly [name] she had some left hip stiffness. It was improved in winter. It feels like her bones are going to snap. She also has some neck pain. She knows it is inflammation.   She is working from home because of health issues and to limit her exposure to people.  She does to her mother twice per day to care for her pets.   If she has very severe pain, she does exercises in bed. She does easy leg lifts.   Pain Inventory Average Pain 4 Pain Right Now 3 My pain is constant, sharp, burning, dull and aching  In the last 24 hours, has pain interfered with the following? General activity 7 Relation with others 7 Enjoyment of life 5 What TIME of day is your pain at its worst? evening and night Sleep (in general) Poor  Pain is worse with: walking, bending, sitting, standing and some activites Pain improves with: rest, heat/ice and medication Relief from Meds: 7  walk without assistance do you drive?  yes  employed # of hrs/week work at [employer] 38-40 hours  bladder control problems trouble walking dizziness depression  New Patient  New Patient    Family History  Problem Relation Age of Onset  . Hyperlipidemia Mother   . Diabetes Mother   . Cancer Mother        kidney   . Cancer Father        liver  . Breast cancer Maternal Aunt   . Heart disease Maternal Grandmother   . Cancer Maternal Grandfather        kidney   . Heart disease Maternal Grandfather   . Hyperlipidemia Maternal Grandfather   . Breast cancer Other   . Anesthesia  problems Sister   . Migraines Sister   . Hypotension Neg Hx   . Malignant hyperthermia Neg Hx   . Pseudochol deficiency Neg Hx    Social History   Socioeconomic History  . Marital status: Single    Spouse name: Not on file  . Number of children: 4  . Years of education: AAS  . Highest education level: Not on file  Occupational History  . Occupation: N/A  Tobacco Use  . Smoking status: Former Smoker    Types: Cigarettes    Quit date: 08/27/1982    Years since quitting: 37.7  . Smokeless tobacco: Never Used  Vaping Use  . Vaping Use: Never used  Substance and Sexual Activity  . Alcohol use: No  . Drug use: No  . Sexual activity: Not Currently    Birth control/protection: Post-menopausal  Other Topics Concern  . Not on file  Social History Narrative   Lives at home w/ her sons   Right-handed   Caffeine: cup of coffee each morning      Does not regularly exercise.    Social Determinants of Health   Financial Resource Strain:   . Difficulty of Paying Living Expenses: Not on file  Food Insecurity:   .  Worried About Charity fundraiser in the Last Year: Not on file  . Ran Out of Food in the Last Year: Not on file  Transportation Needs:   . Lack of Transportation (Medical): Not on file  . Lack of Transportation (Non-Medical): Not on file  Physical Activity:   . Days of Exercise per Week: Not on file  . Minutes of Exercise per Session: Not on file  Stress:   . Feeling of Stress : Not on file  Social Connections:   . Frequency of Communication with Friends and Family: Not on file  . Frequency of Social Gatherings with Friends and Family: Not on file  . Attends Religious Services: Not on file  . Active Member of Clubs or Organizations: Not on file  . Attends Archivist Meetings: Not on file  . Marital Status: Not on file   Past Surgical History:  Procedure Laterality Date  . BIOPSY THYROID     pt has thyroid polyps another scan in Mar 2013  . CAESAREAN  SECTION  96/98/2000  . colonosocpy    . Rectal fistula  1993  . THORACOTOMY  10/26/2011   Procedure: THORACOTOMY MAJOR;  Surgeon: Gaye Pollack, MD;  Location: [facility] OR;  Service: Thoracic;  Laterality: Left;  . UTERINE FIBROID SURGERY     late 30's early 40's   Past Medical History:  Diagnosis Date  . Anxiety    takes Ativan as needed  . Bronchitis    hx of;last time > 53yrago  . Cancer ([facility]    part of left lower lung removed- lung ca 2013  . Colitis   . Colon polyps    hx of  . Complication of anesthesia    gas and MAC procedures  . Depression    takes Wellbutrin daily  . Diabetes mellitus    Novolog and Levemir daily  . [place]   . Eczema   . GERD (gastroesophageal reflux disease)    takes Protonix nightly  . Headache(784.0)    stress HA frequently  . IBS (irritable bowel syndrome)   . Lung mass    left upper lobe  . Pneumonia    walking pneumonia in 2007  . PONV (postoperative nausea and vomiting)   . Shortness of breath    certain times of the year  . Stress incontinence    BP (!) 150/80   Pulse 83   Temp 98.9 F (37.2 C)   Ht _0  (1.778 m)   Wt 278 lb 6.4 oz (126.3 kg)   SpO2 97%   BMI 39.95 kg/m   Opioid Risk Score:   Fall Risk Score:  `1  Depression screen PHQ 2/9  [facility] 05/13/2020 03/17/2020  Decreased Interest 2 1  Down, Depressed, Hopeless 1 0  PHQ - 2 Score 3 1  Altered sleeping 2 1  Tired, decreased energy 3 3  Change in appetite 1 1  Feeling bad or failure about yourself  0 0  Trouble concentrating 0 0  Moving slowly or fidgety/restless 0 0  Suicidal thoughts 0 0  PHQ-9 Score 9 6   Review of Systems  Constitutional: Positive for unexpected weight change.       Weight gain  HENT: Negative.   Eyes: Negative.   Respiratory: Positive for shortness of breath.   Cardiovascular: Negative.   Gastrointestinal: Positive for constipation, diarrhea and nausea.  Endocrine: Negative.     Genitourinary: Negative.  Musculoskeletal: Positive for arthralgias, back pain, gait problem and neck pain.  Skin: Negative.   Allergic/Immunologic: Negative.   Hematological: Negative.   Psychiatric/Behavioral: Negative.        Depression       Objective:   Physical Exam Gen: no distress, normal appearing HEENT: oral mucosa pink and moist, NCAT Cardio: Reg rate Chest: normal effort, normal rate of breathing Abd: soft, non-distended Ext: no edema Skin: intact Neuro: Alert and oriented x3 Musculoskeletal: 5/5 strength with the exception of her bilateral hop abductors (4/5). TTP in bilateral greater trochanteric bursa.  Psych: pleasant, normal affect      Assessment & Plan:  Kimberly [name] is a 59 year old woman who presents to establish care for [place].   Diffuse joint pain secondary to Long COVID:  -Discuss physical therapy, cardiopulmonary rehab, home exercises.  -Diclofenac 1% gel.  -Lidocaine patch 4% -Provided list of foods that can help decrease pain -Discussed stronger grade topicals and medications if the above are not helpful -Discussed natural course of the disease.   Diabetes type 2:  -avoid steroids as they increase her steroids.   Bilateral greater trochanteric pain syndrome: -PT for hib abductor strengthening  Low vitamin D: -discussed how this can be related to COVID recovery. Most recent level reviewed and is at the very low end of normal. Prescribed 50,000U x7 weeks.   RTC in 2 months. All questions answered.

## 2020-05-19 ENCOUNTER — Ambulatory Visit: Payer: Self-pay

## 2020-05-20 ENCOUNTER — Ambulatory Visit: Payer: 59 | Admitting: Internal Medicine

## 2020-05-23 ENCOUNTER — Other Ambulatory Visit: Payer: Self-pay | Admitting: Physical Medicine and Rehabilitation

## 2020-05-23 DIAGNOSIS — M25552 Pain in left hip: Secondary | ICD-10-CM

## 2020-05-23 DIAGNOSIS — M25551 Pain in right hip: Secondary | ICD-10-CM

## 2020-06-03 ENCOUNTER — Ambulatory Visit
Admission: RE | Admit: 2020-06-03 | Discharge: 2020-06-03 | Disposition: A | Discharge status: Home / Self Care | Payer: 59 | Source: Ambulatory Visit | Attending: Physical Medicine and Rehabilitation | Admitting: Physical Medicine and Rehabilitation

## 2020-06-03 ENCOUNTER — Other Ambulatory Visit: Payer: Self-pay | Admitting: Internal Medicine

## 2020-06-03 ENCOUNTER — Encounter: Payer: Self-pay | Admitting: Internal Medicine

## 2020-06-03 ENCOUNTER — Ambulatory Visit (INDEPENDENT_AMBULATORY_CARE_PROVIDER_SITE_OTHER): Payer: 59 | Admitting: Internal Medicine

## 2020-06-03 ENCOUNTER — Other Ambulatory Visit: Payer: Self-pay

## 2020-06-03 VITALS — BP 130/74 | HR 101 | Ht 70.0 in | Wt 280.6 lb

## 2020-06-03 DIAGNOSIS — E1142 Type 2 diabetes mellitus with diabetic polyneuropathy: Secondary | ICD-10-CM

## 2020-06-03 DIAGNOSIS — E7849 Other hyperlipidemia: Secondary | ICD-10-CM

## 2020-06-03 DIAGNOSIS — E1165 Type 2 diabetes mellitus with hyperglycemia: Secondary | ICD-10-CM

## 2020-06-03 DIAGNOSIS — E669 Obesity, unspecified: Secondary | ICD-10-CM

## 2020-06-03 DIAGNOSIS — M25551 Pain in right hip: Secondary | ICD-10-CM

## 2020-06-03 LAB — POCT GLYCOSYLATED HEMOGLOBIN (HGB A1C): Hemoglobin A1C: 8.6 % — AB (ref 4.0–5.6)

## 2020-06-03 NOTE — Patient Instructions (Addendum)
Please continue: - Metformin 1000 mg 2x a day with meals  - Lantus 50 units 2x a day (last dose at bedtime)  Please increase: - NovoLog 20-30 units before meals  Try not to correct at bedtime.  Bring the receiver at next visit.  Please return in 3 months.

## 2020-06-03 NOTE — Progress Notes (Addendum)
Patient ID: Kimberly [name], female   DOB: [birth year], 60 y.o.   MRN: [id number]   This visit occurred during the SARS-CoV-2 public health emergency.  Safety protocols were in place, including screening questions prior to the visit, additional usage of staff PPE, and extensive cleaning of exam room while observing appropriate contact time as indicated for disinfecting solutions.   HPI: Kimberly [name] is a 59 y.o.-year-old female, returning for follow-up for DM2, dx in 2007-2008, insulin-dependent since 2008-2009, uncontrolled, without long term complications.  At last visit, she returned after almost 2 years absence.  She now returns 4 months from last visit.  She had Covid19 09/2019 >> developed joint pain.  This is very bothersome for her as she is in a lot of pain.  Sugars are higher.  Reviewed HbA1c levels: Lab Results  Component Value Date   HGBA1C 7.7 (A) 02/05/2020   HGBA1C 9.0 (A) 03/13/2018   HGBA1C 8.9% 12/05/2017  10/27/2016: HbA1c 8.0%  Pt was on a regimen of: - Metformin 1000 mg 2x a day, with meals - Lantus 52 units in am and 52 units at bedtime - Novolog 35 (but actually taking 25) units 3x a day, before b'fast and at bedtime! Tried Victoza >> GERD, chronic cough and Es pbs, severe AP Also tried another medication >> cough, AP.  Then on: - Metformin 1000 mg 2x a day with meals  - Lantus (did not start Toujeo yet) 52 units 2x a day - Novolog - injected 15 minutes before a meal - misses lunchtime dose at work! - 25 units for a small meal - 30 units for a regular meal - 35 units for a large meal Takes 10 units Novolog prn and 20 units at bedtime prn.  I suggested the following regimen: - Metformin 1000 mg 2x a day with meals  - U500 insulin - 30 min before meals: - before b'fast: 50-60 units - before lunch: 40-50 units - before dinner: 40-50 units  However, at last visit she was still on: - Metformin 1000 mg 2x a day - Lantus (free through pt assintance) 40-50  (depending on the sugars) 2x a day - NovoLog (cannot afford it now):  <200: 15 units >200: 20 units - forgets at dinnertime  She is currently on: - Metformin 1000 mg 2x a day with meals  - Lantus 45 >> 50 units 2x a day (last dose at bedtime) -not affordable-uses patient assistance - NovoLog 18-25 >> 18-20 units before meals and 12-14 occasionally for correction -not before-uses patient assistance Admelog was denied by the insurance.  Pt checks her sugars more than 4 times a day with her freestyle libre 2 CGM.  We could not download the report at last visit and she did not have the receiver with her. She again forgot the receiver... - am: 150-215 >> 180, 200-213 >> 116-240 (icecream at night) >> 180-223 - 2h after b'fast: n/c - before lunch: n/c >> low 200 >> 200s >> 200-300 - 2h after lunch: n/c - before dinner: n/c >> 90s after shopping >> 200s - 2h after dinner: >> n/c >> 260-270 >> n/c >> 250-300 - bedtime: upper 200s >> mid 200s >> n/c - nighttime: n/c >> 201-250 Lowest sugar was 110 >> 180 >> 93 >> 60s; she has hypoglycemia awareness at 100.  Highest sugar was 350 >> 200s >> 340 >> 300s  Glucometer: AccuChek nano >> Accu-Chek guide  Pt's meals are: - Breakfast: shredded wheat with 2% milk - Lunch:  PB sandwich, yoghurt - Dinner: chicken, beef, starch, green beens - Snacks: yoghurt, OJ or cranberry juice  -No CKD, last BUN/creatinine:  Lab Results  Component Value Date   BUN 10 02/18/2020   BUN 12 11/30/2019   CREATININE 0.70 02/18/2020   CREATININE 0.71 11/30/2019   -+ HL; last set of lipids: 03/20/2019: 217/202/65/125 No results found for: CHOL, HDL, LDLCALC, LDLDIRECT, TRIG, CHOLHDL  Not on a statin - refuses.  - last eye exam was on 12/18/2019: + DR  -+ Numbness and tingling in her feet  Pt has FH of DM in M.  She has a h/o lung carcinoid in 2013.  She has a history of frequent sinus and bladder infections.  She also has a history of thyroid nodules,  previously followed by Dr. Tamala Julian.  Reviewed the latest thyroid ultrasound from 2016: She has several nodules, of which some are slightly smaller and some slightly larger.  She has occasional dysphagia, which is chronic.  ROS: Constitutional: no weight gain/no weight loss, + fatigue, + subjective hyperthermia, no subjective hypothermia Eyes: no blurry vision, no xerophthalmia ENT: no sore throat, no nodules palpated in neck, + occasional dysphagia, no odynophagia, no hoarseness Cardiovascular: no CP/no SOB/no palpitations/no leg swelling Respiratory: no cough/no SOB/no wheezing Gastrointestinal: no N/no V/no D/no C/no acid reflux Musculoskeletal: + muscle aches/+ joint aches Skin: no rashes, no hair loss Neurological: no tremors/+ numbness/+ tingling/no dizziness  I reviewed pt's medications, allergies, PMH, social hx, family hx, and changes were documented in the history of present illness. Otherwise, unchanged from my initial visit note.  Past Medical History:  Diagnosis Date  . Anxiety    takes Ativan as needed  . Bronchitis    hx of;last time > 25yrago  . Cancer ([facility]    part of left lower lung removed- lung ca 2013  . Colitis   . Colon polyps    hx of  . Complication of anesthesia    gas and MAC procedures  . Depression    takes Wellbutrin daily  . Diabetes mellitus    Novolog and Levemir daily  . [place]   . Eczema   . GERD (gastroesophageal reflux disease)    takes Protonix nightly  . Headache(784.0)    stress HA frequently  . IBS (irritable bowel syndrome)   . Lung mass    left upper lobe  . Pneumonia    walking pneumonia in 2007  . PONV (postoperative nausea and vomiting)   . Shortness of breath    certain times of the year  . Stress incontinence    Past Surgical History:  Procedure Laterality Date  . BIOPSY THYROID     pt has thyroid polyps another scan in Mar 2013  . CAESAREAN SECTION  96/98/2000  . colonosocpy    . Rectal  fistula  1993  . THORACOTOMY  10/26/2011   Procedure: THORACOTOMY MAJOR;  Surgeon: BGaye Pollack MD;  [facility];  Service: Thoracic;  Laterality: Left;  . UTERINE FIBROID SURGERY     late 30's early 429's  Social History   Socioeconomic History  . Marital status: Single    Spouse name: Not on file  . Number of children: 4 (one deceased)  . Years of education: AAS  . Highest education level: Not on file  Occupational History  . Occupation:  Tech support  Social Needs  . Financial resource strain: Not on file  . Food insecurity:  Worry: Not on file    Inability: Not on file  . Transportation needs:    Medical: Not on file    Non-medical: Not on file  Tobacco Use  . Smoking status: Former Smoker    Types: Cigarettes    Last attempt to quit: 08/27/1982    Years since quitting: 35.2  . Smokeless tobacco: Never Used  Substance and Sexual Activity  . Alcohol use: No  . Drug use: No  . Sexual activity: Not Currently    Birth control/protection: Post-menopausal  Lifestyle  . Physical activity:    Days per week: Not on file    Minutes per session: Not on file  . Stress: Not on file  Relationships  . Social connections:    Talks on phone: Not on file    Gets together: Not on file    Attends religious service: Not on file    Active member of club or organization: Not on file    Attends meetings of clubs or organizations: Not on file    Relationship status: Not on file  . Intimate partner violence:    Fear of current or ex partner: Not on file    Emotionally abused: Not on file    Physically abused: Not on file    Forced sexual activity: Not on file  Other Topics Concern  . Not on file  Social History Narrative   Lives at home w/ her sons   Right-handed   Caffeine: cup of coffee each morning      Does not regularly exercise.    Current Outpatient Medications  Medication Sig Dispense Refill  . ACCU-CHEK FASTCLIX LANCETS MISC   0  . albuterol (PROVENTIL) (2.5  MG/3ML) 0.083% nebulizer solution Take 3 mLs (2.5 mg total) by nebulization every 6 (six) hours as needed for wheezing or shortness of breath. 150 mL 1  . buPROPion (WELLBUTRIN XL) 150 MG 24 hr tablet TAKE 1 TABLET(150 MG) BY MOUTH EVERY EVENING.  Need ov before any more refills. (Patient taking differently: Take 150 mg by mouth every evening. ) 90 tablet 0  . Continuous Blood Gluc Sensor (FREESTYLE LIBRE 2 SENSOR) MISC INJECT 1 SENSOR INTO SKIN EVERY 14 DAYS    . diclofenac Sodium (VOLTAREN) 1 % GEL Apply 2 g topically 4 (four) times daily. 100 g 3  . glucose blood (ACCU-CHEK GUIDE) test strip Use as instructed 4 times a day. 400 each 12  . insulin aspart (NOVOLOG FLEXPEN) 100 UNIT/ML injection Inject 18-25 Units into the skin 3 (three) times daily with meals. Inject 18-25 units before meals 67.5 mL 1  . LANTUS SOLOSTAR 100 UNIT/ML Solostar Pen ADMINISTER 50 UNITS UNDER THE SKIN IN THE MORNING AND AT BEDTIME 45 mL 1  . LORazepam (ATIVAN) 0.5 MG tablet Take 1 tablet (0.5 mg total) by mouth 2 (two) times daily as needed. for anxiety 60 tablet 1  . metFORMIN (GLUCOPHAGE-XR) 500 MG 24 hr tablet Take 2 tablets (1,000 mg total) by mouth 2 (two) times daily. 2 in the AM and 1 in the PM 360 tablet 0  . NOVOLOG FLEXPEN 100 UNIT/ML FlexPen SMARTSIG:18-25 Unit(s) SUB-Q    . ondansetron (ZOFRAN ODT) 4 MG disintegrating tablet 54m ODT q8 hours prn nausea/vomit 4 tablet 2  . pantoprazole (PROTONIX) 40 MG tablet TAKE 1 TABLET(40 MG) BY MOUTH TWICE DAILY.  Needs ov before any more refills 180 tablet 2  . Vitamin D, Ergocalciferol, (DRISDOL) 1.25 MG (50000 UNIT) CAPS capsule Take 1 capsule (50,000  Units total) by mouth every 7 (seven) days. 7 capsule 0   No current facility-administered medications for this visit.   Allergies  Allergen Reactions  . Codeine Nausea And Vomiting    migraines  . Diflucan [Fluconazole] Other (See Comments)    Blisters  . Metoclopramide Hcl Other (See Comments)    Do not give at  all;muscle jerking  . Penicillins Swelling    REACTION: swelling hands  . Prednisone Nausea And Vomiting    migraines   Family History  Problem Relation Age of Onset  . Hyperlipidemia Mother   . Diabetes Mother   . Cancer Mother        kidney   . Cancer Father        liver  . Breast cancer Maternal Aunt   . Heart disease Maternal Grandmother   . Cancer Maternal Grandfather        kidney   . Heart disease Maternal Grandfather   . Hyperlipidemia Maternal Grandfather   . Breast cancer Other   . Anesthesia problems Sister   . Migraines Sister   . Hypotension Neg Hx   . Malignant hyperthermia Neg Hx   . Pseudochol deficiency Neg Hx     PE: BP 130/74 (BP Location: Right Arm, Patient Position: Sitting)   Pulse (!) 101   Ht _0  (1.778 m)   Wt 280 lb 9.6 oz (127.3 kg)   SpO2 98%   BMI 40.26 kg/m  Wt Readings from Last 3 Encounters:  06/03/20 280 lb 9.6 oz (127.3 kg)  05/13/20 278 lb 6.4 oz (126.3 kg)  03/17/20 177 lb (80.3 kg)   Constitutional: overweight, in NAD Eyes: PERRLA, EOMI, no exophthalmos ENT: moist mucous membranes, no thyromegaly, no cervical lymphadenopathy Cardiovascular: RRR, No MRG Respiratory: CTA B Gastrointestinal: abdomen soft, NT, ND, BS+ Musculoskeletal: no deformities, strength intact in all 4 Skin: moist, warm, no rashes Neurological: no tremor with outstretched hands, DTR normal in all 4  ASSESSMENT: 1. DM2, insulin-dependent, uncontrolled, with complications -Peripheral neuropathy  2.   Obesity class III  3. HL  PLAN:  1. Patient with longstanding, type 2 diabetes, on oral antidiabetic regimen with Metformin, and also basal-bolus insulin regimen, with still poor control.  At last visit, however, she returns after almost 2 years of absence and her HbA1c was better, at 7.7%.  In the past, I suggested U500 insulin but she did not start before being lost for follow-up.  She was still on Lantus and NovoLog at last visit.  She was telling that  she could not really afford NovoLog so I gave her a list of possible rapid acting insulins that we could use instead and I advised her to check with her insurance to see which ones are covered.  She was not interested in regular insulin since this did not come in a pen.  At that time, we discussed about retrying a GLP-1 receptor agonist but she had side effects from this in the past and did not want to restart it. Since then, she tried to start Admelog, however, this was not covered.  Fortunately, however, she is now getting Lantus and NovoLog from the patient assistance program from [place]. -At last visit, sugars were still above target at all times of the day and I advised her to take the insulin before meals, as she was taking them at different times after the meals.  She was also varying the dose of Lantus and I advised her to stay with a set  dose. -At today's visit, she still did not bring her CGM receiver.  Therefore, we had to rely on her blood sugar recall.  Sugars are, however, high throughout the day, with few exceptions.  She believes that this is due to limited mobility and increased pain.  She is trying to do upper body exercises, though. -Upon questioning, she is taking lower doses of NovoLog so we discussed about decreasing these for now.  I also emphasized the need to take this 15 minutes before each meal.  States she occasionally can drop her sugars overnight if she corrects high blood sugars at that time, I advised her for now to try not to correct these. - I suggested to:  Patient Instructions  Please continue: - Metformin 1000 mg 2x a day with meals  - Lantus 50 units 2x a day (last dose at bedtime)  Please increase: - NovoLog 20-30 units before meals  Try not to correct at bedtime.  Bring the receiver at next visit.  Please return in 3 months.   - we checked her HbA1c: 8.6% (higher) - advised to check sugars at different times of the day - 3x a day, rotating check times -  advised for yearly eye exams >> she is UTD - return to clinic in 3 months  2.  Obesity class III -Continue Metformin, which is appetite suppressant -Before last visit, she gained 5 to 6 pounds -At last visit we discussed about cutting out concentrated sweets like ice cream especially at night -No weight loss since last visit  3. HL - Reviewed latest lipid panel from 02/2019: LDL above goal, as are her triglycerides -She is not on a statin -We discussed about the rationale of starting a statin.  She refuses one for now but she would not want to take another medication, but will think about this after the results are back. -She is due for another lipid panel -we will check this today (nonfasting)  Component     Latest Ref Rng & Units 06/03/2020  Cholesterol, Total     100 - 199 mg/dL 235 (H)  Triglycerides     0 - 149 mg/dL 219 (H)  HDL Cholesterol     >39 mg/dL 68  VLDL Cholesterol Cal     5 - 40 mg/dL 38  LDL Chol Calc (NIH)     0 - 99 mg/dL 129 (H)  Total CHOL/HDL Ratio     0.0 - 4.4 ratio 3.5   LDL is still elevated, as are her triglycerides.  I would still recommend a statin, for example rosuvastatin 5 mg daily.   I will check with the patient if she agrees to start this.  Philemon Kingdom, MD PhD [facility] Endocrinology

## 2020-06-04 LAB — LIPID PANEL
Chol/HDL Ratio: 3.5 ratio (ref 0.0–4.4)
Cholesterol, Total: 235 mg/dL — ABNORMAL HIGH (ref 100–199)
HDL: 68 mg/dL (ref >39)
LDL Chol Calc (NIH): 129 mg/dL — ABNORMAL HIGH (ref 0–99)
Triglycerides: 219 mg/dL — ABNORMAL HIGH (ref 0–149)
VLDL Cholesterol Cal: 38 mg/dL (ref 5–40)

## 2020-06-06 ENCOUNTER — Encounter: Payer: Self-pay | Admitting: Internal Medicine

## 2020-06-07 ENCOUNTER — Telehealth: Payer: Self-pay | Admitting: Internal Medicine

## 2020-06-07 NOTE — Telephone Encounter (Signed)
See my chart message

## 2020-06-07 NOTE — Telephone Encounter (Signed)
Patient requests to be called at ph# [phone] re: Patient is concerned about taking Crestor due to side effects. Patient is available until 9 a.m. this morning or approx. 11:35 a.m. this morning. If patient is unable to answer patient requests to be left a voicemail.

## 2020-06-09 ENCOUNTER — Ambulatory Visit: Payer: 59 | Admitting: Hematology & Oncology

## 2020-06-09 ENCOUNTER — Other Ambulatory Visit: Payer: 59

## 2020-06-20 ENCOUNTER — Telehealth: Payer: Self-pay | Admitting: Family Medicine

## 2020-06-20 NOTE — Telephone Encounter (Signed)
Caller Jaynie Hitch  Call Back # [phone]  Patient states her son was DX with covid today and she wonders if she can take an infusion to protected her covid.  Patient scared of getting covid again.    Please Advise

## 2020-06-20 NOTE — Telephone Encounter (Signed)
She would not get infusion fot sub q shots--- can refer to clinic for sub q

## 2020-06-20 NOTE — Telephone Encounter (Signed)
Please advise 

## 2020-06-23 ENCOUNTER — Ambulatory Visit: Payer: 59

## 2020-06-23 NOTE — Telephone Encounter (Signed)
Can you please clarify this message? Which injection? Which clinic?

## 2020-06-23 NOTE — Telephone Encounter (Signed)
The infusion clinic for the sub q antibody injections for someone he has be exposed with no symptoms

## 2020-06-24 ENCOUNTER — Inpatient Hospital Stay (HOSPITAL_BASED_OUTPATIENT_CLINIC_OR_DEPARTMENT_OTHER): Payer: 59 | Admitting: Hematology & Oncology

## 2020-06-24 ENCOUNTER — Other Ambulatory Visit: Payer: Self-pay | Admitting: Surgery

## 2020-06-24 ENCOUNTER — Encounter: Payer: Self-pay | Admitting: Hematology & Oncology

## 2020-06-24 ENCOUNTER — Inpatient Hospital Stay: Payer: 59 | Attending: Hematology & Oncology

## 2020-06-24 ENCOUNTER — Other Ambulatory Visit: Payer: Self-pay

## 2020-06-24 VITALS — BP 156/77 | Temp 98.4°F | Resp 18 | Wt 282.0 lb

## 2020-06-24 DIAGNOSIS — R161 Splenomegaly, not elsewhere classified: Secondary | ICD-10-CM | POA: Insufficient documentation

## 2020-06-24 DIAGNOSIS — D729 Disorder of white blood cells, unspecified: Secondary | ICD-10-CM | POA: Diagnosis not present

## 2020-06-24 DIAGNOSIS — K7581 Nonalcoholic steatohepatitis (NASH): Secondary | ICD-10-CM | POA: Diagnosis not present

## 2020-06-24 DIAGNOSIS — E119 Type 2 diabetes mellitus without complications: Secondary | ICD-10-CM | POA: Diagnosis not present

## 2020-06-24 DIAGNOSIS — D696 Thrombocytopenia, unspecified: Secondary | ICD-10-CM | POA: Diagnosis present

## 2020-06-24 DIAGNOSIS — Z8616 Personal history of COVID-19: Secondary | ICD-10-CM | POA: Diagnosis not present

## 2020-06-24 DIAGNOSIS — C3490 Malignant neoplasm of unspecified part of unspecified bronchus or lung: Secondary | ICD-10-CM

## 2020-06-24 DIAGNOSIS — Z794 Long term (current) use of insulin: Secondary | ICD-10-CM | POA: Insufficient documentation

## 2020-06-24 LAB — PLATELET BY CITRATE

## 2020-06-24 LAB — CBC WITH DIFFERENTIAL (CANCER CENTER ONLY)
Abs Immature Granulocytes: 0.02 10*3/uL (ref 0.00–0.07)
Basophils Absolute: 0 10*3/uL (ref 0.0–0.1)
Basophils Relative: 1 %
Eosinophils Absolute: 0.1 10*3/uL (ref 0.0–0.5)
Eosinophils Relative: 3 %
HCT: 42.6 % (ref 36.0–46.0)
Hemoglobin: 13.7 g/dL (ref 12.0–15.0)
Immature Granulocytes: 1 %
Lymphocytes Relative: 34 %
Lymphs Abs: 1.5 10*3/uL (ref 0.7–4.0)
MCH: 27.3 pg (ref 26.0–34.0)
MCHC: 32.2 g/dL (ref 30.0–36.0)
MCV: 84.9 fL (ref 80.0–100.0)
Monocytes Absolute: 0.3 10*3/uL (ref 0.1–1.0)
Monocytes Relative: 6 %
Neutro Abs: 2.4 10*3/uL (ref 1.7–7.7)
Neutrophils Relative %: 55 %
Platelet Count: 116 10*3/uL — ABNORMAL LOW (ref 150–400)
RBC: 5.02 MIL/uL (ref 3.87–5.11)
RDW: 14.6 % (ref 11.5–15.5)
WBC Count: 4.4 10*3/uL (ref 4.0–10.5)
nRBC: 0 % (ref 0.0–0.2)

## 2020-06-24 LAB — CMP (CANCER CENTER ONLY)
ALT: 27 U/L (ref 0–44)
AST: 25 U/L (ref 15–41)
Albumin: 4.2 g/dL (ref 3.5–5.0)
Alkaline Phosphatase: 141 U/L — ABNORMAL HIGH (ref 38–126)
Anion gap: 7 (ref 5–15)
BUN: 14 mg/dL (ref 6–20)
CO2: 31 mmol/L (ref 22–32)
Calcium: 10.3 mg/dL (ref 8.9–10.3)
Chloride: 97 mmol/L — ABNORMAL LOW (ref 98–111)
Creatinine: 0.75 mg/dL (ref 0.44–1.00)
GFR, Estimated: >60 mL/min (ref >60)
Glucose, Bld: 289 mg/dL — ABNORMAL HIGH (ref 70–99)
Potassium: 4.1 mmol/L (ref 3.5–5.1)
Sodium: 135 mmol/L (ref 135–145)
Total Bilirubin: 0.5 mg/dL (ref 0.3–1.2)
Total Protein: 7.5 g/dL (ref 6.5–8.1)

## 2020-06-24 LAB — LACTATE DEHYDROGENASE: LDH: 133 U/L (ref 98–192)

## 2020-06-24 NOTE — Progress Notes (Addendum)
Hematology and Oncology Follow Up Visit  Kimberly [name] [id number] 01/09/1961 59 y.o. 06/24/2020   Principle Diagnosis:   Mild thrombocytopenia secondary to NASH and splenomegaly  Current Therapy:    Observation     Interim History:  Kimberly [name] is back for third office visit.  We first saw her back in April.  At that time, she had mild thrombocytopenia.  I really felt that this was secondary to her NASH and splenomegaly.  There has been no bleeding.  She has had no bruising.  She has had no issues with nausea or vomiting.  She does have diabetes.  Her blood sugars are not that well controlled today.  She has had no fever.  She did have the COVID virus back in February.  I suppose this may be a factor with the thrombocytopenia.  She has had no headache.  There is been no leg swelling.  She has had no rashes.  Overall, her performance status is ECOG 1.  Medications:  Current Outpatient Medications:  .  ACCU-CHEK FASTCLIX LANCETS MISC, , Disp: , Rfl: 0 .  albuterol (PROVENTIL) (2.5 MG/3ML) 0.083% nebulizer solution, Take 3 mLs (2.5 mg total) by nebulization every 6 (six) hours as needed for wheezing or shortness of breath., Disp: 150 mL, Rfl: 1 .  buPROPion (WELLBUTRIN XL) 150 MG 24 hr tablet, TAKE 1 TABLET(150 MG) BY MOUTH EVERY EVENING.  Need ov before any more refills. (Patient taking differently: Take 150 mg by mouth every evening. ), Disp: 90 tablet, Rfl: 0 .  Continuous Blood Gluc Sensor (FREESTYLE LIBRE 2 SENSOR) MISC, INJECT 1 SENSOR INTO SKIN EVERY 14 DAYS, Disp: , Rfl:  .  diclofenac Sodium (VOLTAREN) 1 % GEL, Apply 2 g topically 4 (four) times daily., Disp: 100 g, Rfl: 3 .  glucose blood (ACCU-CHEK GUIDE) test strip, Use as instructed 4 times a day., Disp: 400 each, Rfl: 12 .  insulin aspart (NOVOLOG FLEXPEN) 100 UNIT/ML injection, Inject 18-25 Units into the skin 3 (three) times daily with meals. Inject 18-25 units before meals, Disp: 67.5 mL, Rfl: 1 .  LANTUS SOLOSTAR  100 UNIT/ML Solostar Pen, ADMINISTER 50 UNITS UNDER THE SKIN IN THE MORNING AND AT BEDTIME, Disp: 45 mL, Rfl: 1 .  LORazepam (ATIVAN) 0.5 MG tablet, Take 1 tablet (0.5 mg total) by mouth 2 (two) times daily as needed. for anxiety, Disp: 60 tablet, Rfl: 1 .  metFORMIN (GLUCOPHAGE-XR) 500 MG 24 hr tablet, Take 2 tablets (1,000 mg total) by mouth 2 (two) times daily. 2 in the AM and 1 in the PM, Disp: 360 tablet, Rfl: 0 .  NOVOLOG FLEXPEN 100 UNIT/ML FlexPen, SMARTSIG:18-25 Unit(s) SUB-Q, Disp: , Rfl:  .  ondansetron (ZOFRAN ODT) 4 MG disintegrating tablet, 51m ODT q8 hours prn nausea/vomit, Disp: 4 tablet, Rfl: 2 .  pantoprazole (PROTONIX) 40 MG tablet, TAKE 1 TABLET(40 MG) BY MOUTH TWICE DAILY.  Needs ov before any more refills, Disp: 180 tablet, Rfl: 2 .  Vitamin D, Ergocalciferol, (DRISDOL) 1.25 MG (50000 UNIT) CAPS capsule, Take 1 capsule (50,000 Units total) by mouth every 7 (seven) days., Disp: 7 capsule, Rfl: 0  Allergies:  Allergies  Allergen Reactions  . Codeine Nausea And Vomiting    migraines  . Diflucan [Fluconazole] Other (See Comments)    Blisters  . Metoclopramide Hcl Other (See Comments)    Do not give at all;muscle jerking  . Penicillins Swelling    REACTION: swelling hands  . Prednisone Nausea And Vomiting  migraines    Past Medical History, Surgical history, Social history, and Family History were reviewed and updated.  Review of Systems: Review of Systems  Constitutional: Negative.   HENT:  Negative.   Eyes: Negative.   Respiratory: Negative.   Cardiovascular: Negative.   Gastrointestinal: Negative.   Endocrine: Negative.   Genitourinary: Negative.    Musculoskeletal: Negative.   Skin: Negative.   Neurological: Negative.   Hematological: Negative.   Psychiatric/Behavioral: Negative.     Physical Exam:  weight is 282 lb (127.9 kg). Her oral temperature is 98.4 F (36.9 C). Her blood pressure is 156/77 (abnormal). Her respiration is 18 and oxygen  saturation is 98%.   Wt Readings from Last 3 Encounters:  06/24/20 282 lb (127.9 kg)  06/03/20 280 lb 9.6 oz (127.3 kg)  05/13/20 278 lb 6.4 oz (126.3 kg)    Physical Exam Vitals reviewed.  HENT:     Head: Normocephalic and atraumatic.  Eyes:     Pupils: Pupils are equal, round, and reactive to light.  Cardiovascular:     Rate and Rhythm: Normal rate and regular rhythm.     Heart sounds: Normal heart sounds.  Pulmonary:     Effort: Pulmonary effort is normal.     Breath sounds: Normal breath sounds.  Abdominal:     General: Bowel sounds are normal.     Palpations: Abdomen is soft.  Musculoskeletal:        General: No tenderness or deformity. Normal range of motion.     Cervical back: Normal range of motion.  Lymphadenopathy:     Cervical: No cervical adenopathy.  Skin:    General: Skin is warm and dry.     Findings: No erythema or rash.  Neurological:     Mental Status: She is alert and oriented to person, place, and time.  Psychiatric:        Behavior: Behavior normal.        Thought Content: Thought content normal.        Judgment: Judgment normal.      Lab Results  Component Value Date   WBC 4.4 06/24/2020   HGB 13.7 06/24/2020   HCT 42.6 06/24/2020   MCV 84.9 06/24/2020   PLT 116 (L) 06/24/2020     Chemistry      Component Value Date/Time   NA 135 06/24/2020 1527   K 4.1 06/24/2020 1527   CL 97 (L) 06/24/2020 1527   CO2 31 06/24/2020 1527   BUN 14 06/24/2020 1527   CREATININE 0.75 06/24/2020 1527   CREATININE 0.82 12/05/2017 1411      Component Value Date/Time   CALCIUM 10.3 06/24/2020 1527   ALKPHOS 141 (H) 06/24/2020 1527   AST 25 06/24/2020 1527   ALT 27 06/24/2020 1527   BILITOT 0.5 06/24/2020 1527      Impression and Plan: Kimberly [name] is a very charming 59 year old white female.  She is a lot of fun to talk to.  She has mild and stable thrombocytopenia.  I really think that this is more of a consumptive process with the splenomegaly.   She does have the NASH.  I think her blood sugars really are a problem given that her blood sugar was 290 today.  I really do not think that she will have a problem with her platelet count for a while.  I think that so goes her liver function, so goes her platelets.  I looked at her blood under the microscope.  She had adequate  platelets.  There may been decreased a little bit.  I really do not see any immature cells.  She had several large platelets that were well granulated.  I still think we get her back in 6 months.  She knows what to watch out for in the interim.  I told to watch out for bleeding, bruising or a rash on her legs.  She understands this.   Volanda Napoleon, MD 10/29/20214:54 PM

## 2020-06-24 NOTE — Telephone Encounter (Signed)
Left message to return call with infusion number.

## 2020-06-27 ENCOUNTER — Telehealth: Payer: Self-pay | Admitting: Hematology & Oncology

## 2020-06-27 NOTE — Telephone Encounter (Signed)
Appointments scheduled calendar printed & mailed per 10/29 los

## 2020-06-30 ENCOUNTER — Other Ambulatory Visit: Payer: Self-pay | Admitting: Physical Medicine and Rehabilitation

## 2020-07-04 ENCOUNTER — Ambulatory Visit: Payer: 59 | Admitting: Family Medicine

## 2020-07-05 ENCOUNTER — Encounter: Payer: Self-pay | Admitting: Family Medicine

## 2020-07-05 ENCOUNTER — Other Ambulatory Visit: Payer: Self-pay

## 2020-07-05 ENCOUNTER — Ambulatory Visit: Payer: 59 | Admitting: Family Medicine

## 2020-07-05 VITALS — BP 140/98 | HR 81 | Temp 98.0°F | Resp 20 | Ht 70.0 in | Wt 282.2 lb

## 2020-07-05 DIAGNOSIS — J0140 Acute pansinusitis, unspecified: Secondary | ICD-10-CM | POA: Diagnosis not present

## 2020-07-05 DIAGNOSIS — I10 Essential (primary) hypertension: Secondary | ICD-10-CM

## 2020-07-05 DIAGNOSIS — R110 Nausea: Secondary | ICD-10-CM | POA: Diagnosis not present

## 2020-07-05 MED ORDER — ONDANSETRON 4 MG PO TBDP: ORAL_TABLET | ORAL | 2 refills | Status: DC

## 2020-07-05 MED ORDER — FLUTICASONE PROPIONATE 50 MCG/ACT NA SUSP: 2.0000 | Freq: Every day | NASAL | 6 refills | Status: DC

## 2020-07-05 MED ORDER — DOXYCYCLINE HYCLATE 100 MG PO TABS: 100.0000 mg | ORAL_TABLET | Freq: Two times a day (BID) | ORAL | 0 refills | Status: DC

## 2020-07-05 MED ORDER — HYDROCHLOROTHIAZIDE 25 MG PO TABS: 25.0000 mg | ORAL_TABLET | Freq: Every day | ORAL | 3 refills | Status: DC

## 2020-07-05 NOTE — Assessment & Plan Note (Signed)
Poorly controlled will alter medications, encouraged DASH diet, minimize caffeine and obtain adequate sleep. Report concerning symptoms and follow up as directed and as needed 

## 2020-07-05 NOTE — Progress Notes (Signed)
Patient ID: Kimberly [name], female    DOB: May 05, 1961  Age: 59 y.o. MRN: [id number]    Subjective:  Subjective  HPI Kimberly [name] presents for bp f/u and c/o cough and sinus headache and pressure   She is taking Ibuprofen   She is also using afrin.  She has had these symptoms for several days.     Review of Systems  Constitutional: Positive for chills. Negative for fever.  HENT: Positive for congestion, postnasal drip, rhinorrhea, sinus pressure and sinus pain. Negative for sneezing, sore throat and tinnitus.   Respiratory: Positive for cough. Negative for chest tightness, shortness of breath and wheezing.   Cardiovascular: Negative for chest pain, palpitations and leg swelling.  Allergic/Immunologic: Negative for environmental allergies.    History Past Medical History:  Diagnosis Date  . Anxiety    takes Ativan as needed  . Bronchitis    hx of;last time > 3yrago  . Cancer ([facility]    part of left lower lung removed- lung ca 2013  . Colitis   . Colon polyps    hx of  . Complication of anesthesia    gas and MAC procedures  . Depression    takes Wellbutrin daily  . Diabetes mellitus    Novolog and Levemir daily  [place]   . Eczema   . GERD (gastroesophageal reflux disease)    takes Protonix nightly  . Headache(784.0)    stress HA frequently  . IBS (irritable bowel syndrome)   . Lung mass    left upper lobe  . Pneumonia    walking pneumonia in 2007  . PONV (postoperative nausea and vomiting)   . Shortness of breath    certain times of the year  . Stress incontinence     She has a past surgical history that includes CAESAREAN SECTION (96/98/2000); Rectal fistula (1993); Uterine fibroid surgery; colonosocpy; Biopsy thyroid; and Thoracotomy (10/26/2011).   Her family history includes Anesthesia problems in her sister; Breast cancer in her maternal aunt and another family member; Cancer in her father, maternal grandfather, and mother;  Diabetes in her mother; Heart disease in her maternal grandfather and maternal grandmother; Hyperlipidemia in her maternal grandfather and mother; Migraines in her sister.She reports that she quit smoking about 37 years ago. Her smoking use included cigarettes. She has never used smokeless tobacco. She reports that she does not drink alcohol and does not use drugs.  Current Outpatient Medications on File Prior to Visit  Medication Sig Dispense Refill  . ACCU-CHEK FASTCLIX LANCETS MISC   0  . albuterol (PROVENTIL) (2.5 MG/3ML) 0.083% nebulizer solution Take 3 mLs (2.5 mg total) by nebulization every 6 (six) hours as needed for wheezing or shortness of breath. 150 mL 1  . buPROPion (WELLBUTRIN XL) 150 MG 24 hr tablet TAKE 1 TABLET(150 MG) BY MOUTH EVERY EVENING.  Need ov before any more refills. (Patient taking differently: Take 150 mg by mouth every evening. ) 90 tablet 0  . Continuous Blood Gluc Sensor (FREESTYLE LIBRE 2 SENSOR) MISC INJECT 1 SENSOR INTO SKIN EVERY 14 DAYS    . diclofenac Sodium (VOLTAREN) 1 % GEL Apply 2 g topically 4 (four) times daily. 100 g 3  . glucose blood (ACCU-CHEK GUIDE) test strip Use as instructed 4 times a day. 400 each 12  . insulin aspart (NOVOLOG FLEXPEN) 100 UNIT/ML injection Inject 18-25 Units into the skin 3 (three) times daily with meals. Inject 18-25 units before meals  67.5 mL 1  . LANTUS SOLOSTAR 100 UNIT/ML Solostar Pen ADMINISTER 50 UNITS UNDER THE SKIN IN THE MORNING AND AT BEDTIME 45 mL 1  . LORazepam (ATIVAN) 0.5 MG tablet Take 1 tablet (0.5 mg total) by mouth 2 (two) times daily as needed. for anxiety 60 tablet 1  . metFORMIN (GLUCOPHAGE-XR) 500 MG 24 hr tablet Take 2 tablets (1,000 mg total) by mouth 2 (two) times daily. 2 in the AM and 1 in the PM 360 tablet 0  . NOVOLOG FLEXPEN 100 UNIT/ML FlexPen SMARTSIG:18-25 Unit(s) SUB-Q    . pantoprazole (PROTONIX) 40 MG tablet TAKE 1 TABLET(40 MG) BY MOUTH TWICE DAILY.  Needs ov before any more refills 180 tablet  2  . Vitamin D, Ergocalciferol, (DRISDOL) 1.25 MG (50000 UNIT) CAPS capsule Take 1 capsule (50,000 Units total) by mouth every 7 (seven) days. 7 capsule 0   No current facility-administered medications on file prior to visit.     Objective:  Objective  Physical Exam Vitals and nursing note reviewed.  Constitutional:      Appearance: She is well-developed. She is diaphoretic.  HENT:     Head: Normocephalic and atraumatic.     Nose: Mucosal edema, congestion and rhinorrhea present. No nasal deformity.     Right Sinus: Maxillary sinus tenderness and frontal sinus tenderness present.     Left Sinus: Maxillary sinus tenderness and frontal sinus tenderness present.     Mouth/Throat:     Pharynx: No oropharyngeal exudate.  Eyes:     Conjunctiva/sclera: Conjunctivae normal.  Neck:     Thyroid: No thyromegaly.     Vascular: No carotid bruit or JVD.  Cardiovascular:     Rate and Rhythm: Normal rate and regular rhythm.     Heart sounds: Normal heart sounds. No murmur heard.   Pulmonary:     Effort: Pulmonary effort is normal. No respiratory distress.     Breath sounds: Normal breath sounds. No wheezing or rales.  Chest:     Chest wall: No tenderness.  Musculoskeletal:     Cervical back: Normal range of motion and neck supple.  Lymphadenopathy:     Cervical: No cervical adenopathy.  Skin:    General: Skin is warm.  Neurological:     Mental Status: She is alert and oriented to person, place, and time.    BP (!) 140/98 (BP Location: Left Arm, Patient Position: Sitting, Cuff Size: Large)   Pulse 81   Temp 98 F (36.7 C) (Oral)   Resp 20   Ht _0  (1.778 m)   Wt 282 lb 3.2 oz (128 kg)   SpO2 98%   BMI 40.49 kg/m  Wt Readings from Last 3 Encounters:  07/05/20 282 lb 3.2 oz (128 kg)  06/24/20 282 lb (127.9 kg)  06/03/20 280 lb 9.6 oz (127.3 kg)     Lab Results  Component Value Date   WBC 4.4 06/24/2020   HGB 13.7 06/24/2020   HCT 42.6 06/24/2020   PLT 116 (L)  06/24/2020   GLUCOSE 289 (H) 06/24/2020   CHOL 235 (H) 06/03/2020   TRIG 219 (H) 06/03/2020   HDL 68 06/03/2020   LDLCALC 129 (H) 06/03/2020   ALT 27 06/24/2020   AST 25 06/24/2020   NA 135 06/24/2020   K 4.1 06/24/2020   CL 97 (L) 06/24/2020   CREATININE 0.75 06/24/2020   BUN 14 06/24/2020   CO2 31 06/24/2020   TSH 0.97 02/18/2020   INR 1.2 10/19/2019   HGBA1C  8.6 (A) 06/03/2020   MICROALBUR 1.5 04/10/2018    DG HIPS BILAT WITH PELVIS 2V  Result Date: 06/04/2020 CLINICAL DATA:  Hip pain EXAM: DG HIP (WITH OR WITHOUT PELVIS) 2V BILAT COMPARISON:  November 11, 2015 FINDINGS: No acute fracture or dislocation. There are mild degenerative changes of bilateral hips with mild joint space narrowing and osteophyte formation. No area of erosion or osseous destruction. No unexpected radiopaque foreign body. Soft tissues are unremarkable. IMPRESSION: Mild degenerative changes of bilateral hips. Electronically Signed   By: Valentino Saxon MD   On: 06/04/2020 20:49     Assessment & Plan:  Plan  I am having Zeva L. Hamid start on hydrochlorothiazide, doxycycline, and fluticasone. I am also having her maintain her Accu-Chek FastClix Lancets, albuterol, buPROPion, LORazepam, pantoprazole, FreeStyle Libre 2 Sensor, metFORMIN, Accu-Chek Guide, insulin aspart, Lantus SoloStar, NovoLOG FlexPen, diclofenac Sodium, Vitamin D (Ergocalciferol), and ondansetron.  Meds ordered this encounter  Medications  . hydrochlorothiazide (HYDRODIURIL) 25 MG tablet    Sig: Take 1 tablet (25 mg total) by mouth daily.    Dispense:  90 tablet    Refill:  3  . ondansetron (ZOFRAN ODT) 4 MG disintegrating tablet    Sig: 93m ODT q8 hours prn nausea/vomit    Dispense:  20 tablet    Refill:  2  . doxycycline (VIBRA-TABS) 100 MG tablet    Sig: Take 1 tablet (100 mg total) by mouth 2 (two) times daily.    Dispense:  20 tablet    Refill:  0  . fluticasone (FLONASE) 50 MCG/ACT nasal spray    Sig: Place 2 sprays into  both nostrils daily.    Dispense:  16 g    Refill:  6    Problem List Items Addressed This Visit      Unprioritized   ACUTE SINUSITIS, UNSPECIFIED    abx per orders flonase        Relevant Medications   doxycycline (VIBRA-TABS) 100 MG tablet   fluticasone (FLONASE) 50 MCG/ACT nasal spray   Nausea   Relevant Medications   ondansetron (ZOFRAN ODT) 4 MG disintegrating tablet   Primary hypertension - Primary    Poorly controlled will alter medications, encouraged DASH diet, minimize caffeine and obtain adequate sleep. Report concerning symptoms and follow up as directed and as needed      Relevant Medications   hydrochlorothiazide (HYDRODIURIL) 25 MG tablet      Follow-up: Return in about 2 weeks (around 07/19/2020), or if symptoms worsen or fail to improve, for hypertension.  YAnn Held DO

## 2020-07-05 NOTE — Patient Instructions (Addendum)
DASH Eating Plan DASH stands for "Dietary Approaches to Stop Hypertension." The DASH eating plan is a healthy eating plan that has been shown to reduce high blood pressure (hypertension). It may also reduce your risk for type 2 diabetes, heart disease, and stroke. The DASH eating plan may also help with weight loss. What are tips for following this plan?  General guidelines  Avoid eating more than 2,300 mg (milligrams) of salt (sodium) a day. If you have hypertension, you may need to reduce your sodium intake to 1,500 mg a day.  Limit alcohol intake to no more than 1 drink a day for nonpregnant women and 2 drinks a day for men. One drink equals 12 oz of beer, 5 oz of wine, or 1 oz of hard liquor.  Work with your health care provider to maintain a healthy body weight or to lose weight. Ask what an ideal weight is for you.  Get at least 30 minutes of exercise that causes your heart to beat faster (aerobic exercise) most days of the week. Activities may include walking, swimming, or biking.  Work with your health care provider or diet and nutrition specialist (dietitian) to adjust your eating plan to your individual calorie needs. Reading food labels   Check food labels for the amount of sodium per serving. Choose foods with less than 5 percent of the Daily Value of sodium. Generally, foods with less than 300 mg of sodium per serving fit into this eating plan.  To find whole grains, look for the word "whole" as the first word in the ingredient list. Shopping  Buy products labeled as "low-sodium" or "no salt added."  Buy fresh foods. Avoid canned foods and premade or frozen meals. Cooking  Avoid adding salt when cooking. Use salt-free seasonings or herbs instead of table salt or sea salt. Check with your health care provider or pharmacist before using salt substitutes.  Do not fry foods. Cook foods using healthy methods such as baking, boiling, grilling, and broiling instead.  Cook with  heart-healthy oils, such as olive, canola, soybean, or sunflower oil. Meal planning  Eat a balanced diet that includes: ? 5 or more servings of fruits and vegetables each day. At each meal, try to fill half of your plate with fruits and vegetables. ? Up to 6-8 servings of whole grains each day. ? Less than 6 oz of lean meat, poultry, or fish each day. A 3-oz serving of meat is about the same size as a deck of cards. One egg equals 1 oz. ? 2 servings of low-fat dairy each day. ? A serving of nuts, seeds, or beans 5 times each week. ? Heart-healthy fats. Healthy fats called Omega-3 fatty acids are found in foods such as flaxseeds and coldwater fish, like sardines, salmon, and mackerel.  Limit how much you eat of the following: ? Canned or prepackaged foods. ? Food that is high in trans fat, such as fried foods. ? Food that is high in saturated fat, such as fatty meat. ? Sweets, desserts, sugary drinks, and other foods with added sugar. ? Full-fat dairy products.  Do not salt foods before eating.  Try to eat at least 2 vegetarian meals each week.  Eat more home-cooked food and less restaurant, buffet, and fast food.  When eating at a restaurant, ask that your food be prepared with less salt or no salt, if possible. What foods are recommended? The items listed may not be a complete list. Talk with your dietitian about  what dietary choices are best for you. Grains Whole-grain or whole-wheat bread. Whole-grain or whole-wheat pasta. Brown rice. [place]. Bulgur. Whole-grain and low-sodium cereals. Pita bread. Low-fat, low-sodium crackers. Whole-wheat flour tortillas. Vegetables Fresh or frozen vegetables (raw, steamed, roasted, or grilled). Low-sodium or reduced-sodium tomato and vegetable juice. Low-sodium or reduced-sodium tomato sauce and tomato paste. Low-sodium or reduced-sodium canned vegetables. Fruits All fresh, dried, or frozen fruit. Canned fruit in natural juice (without  added sugar). Meat and other protein foods Skinless chicken or [place]. Ground chicken or [place]. Pork with fat trimmed off. Fish and seafood. Egg whites. Dried beans, peas, or lentils. Unsalted nuts, nut butters, and seeds. Unsalted canned beans. Lean cuts of beef with fat trimmed off. Low-sodium, lean deli meat. Dairy Low-fat (1%) or fat-free (skim) milk. Fat-free, low-fat, or reduced-fat cheeses. Nonfat, low-sodium ricotta or cottage cheese. Low-fat or nonfat yogurt. Low-fat, low-sodium cheese. Fats and oils Soft margarine without trans fats. Vegetable oil. Low-fat, reduced-fat, or light mayonnaise and salad dressings (reduced-sodium). Canola, safflower, olive, soybean, and sunflower oils. Avocado. Seasoning and other foods Herbs. Spices. Seasoning mixes without salt. Unsalted popcorn and pretzels. Fat-free sweets. What foods are not recommended? The items listed may not be a complete list. Talk with your dietitian about what dietary choices are best for you. Grains Baked goods made with fat, such as croissants, muffins, or some breads. Dry pasta or rice meal packs. Vegetables Creamed or fried vegetables. Vegetables in a cheese sauce. Regular canned vegetables (not low-sodium or reduced-sodium). Regular canned tomato sauce and paste (not low-sodium or reduced-sodium). Regular tomato and vegetable juice (not low-sodium or reduced-sodium). Angie Fava. Olives. Fruits Canned fruit in a light or heavy syrup. Fried fruit. Fruit in cream or butter sauce. Meat and other protein foods Fatty cuts of meat. Ribs. Fried meat. Berniece Salines. Sausage. Bologna and other processed lunch meats. Salami. Fatback. Hotdogs. Bratwurst. Salted nuts and seeds. Canned beans with added salt. Canned or smoked fish. Whole eggs or egg yolks. Chicken or [place] with skin. Dairy Whole or 2% milk, cream, and half-and-half. Whole or full-fat cream cheese. Whole-fat or sweetened yogurt. Full-fat cheese. Nondairy creamers. Whipped toppings.  Processed cheese and cheese spreads. Fats and oils Butter. Stick margarine. Lard. Shortening. Ghee. Bacon fat. Tropical oils, such as coconut, palm kernel, or palm oil. Seasoning and other foods Salted popcorn and pretzels. Onion salt, garlic salt, seasoned salt, table salt, and sea salt. Worcestershire sauce. Tartar sauce. Barbecue sauce. Teriyaki sauce. Soy sauce, including reduced-sodium. Steak sauce. Canned and packaged gravies. Fish sauce. Oyster sauce. Cocktail sauce. Horseradish that you find on the shelf. Ketchup. Mustard. Meat flavorings and tenderizers. Bouillon cubes. Hot sauce and Tabasco sauce. Premade or packaged marinades. Premade or packaged taco seasonings. Relishes. Regular salad dressings. Where to find more information:  National Heart, Lung, and [place]: https://wilson-eaton.com/  American Heart Association: www.heart.org Summary  The DASH eating plan is a healthy eating plan that has been shown to reduce high blood pressure (hypertension). It may also reduce your risk for type 2 diabetes, heart disease, and stroke.  With the DASH eating plan, you should limit salt (sodium) intake to 2,300 mg a day. If you have hypertension, you may need to reduce your sodium intake to 1,500 mg a day.  When on the DASH eating plan, aim to eat more fresh fruits and vegetables, whole grains, lean proteins, low-fat dairy, and heart-healthy fats.  Work with your health care provider or diet and nutrition specialist (dietitian) to adjust your eating plan to your  individual calorie needs. This information is not intended to replace advice given to you by your health care provider. Make sure you discuss any questions you have with your health care provider. Document Revised: 07/26/2017 Document Reviewed: 08/06/2016 Elsevier Patient Education  [place].  Potassium Content of Foods  Potassium is a mineral found in many foods and drinks. It affects how the heart works, and helps keep  fluids and minerals balanced in the body. The amount of potassium you need each day depends on your age and any medical conditions you may have. Talk to your health care provider or dietitian about how much potassium you need. The following lists of foods provide the general serving size for foods and the approximate amount of potassium in each serving, listed in milligrams (mg). Actual values may vary depending on the product and how it is processed. High in potassium The following foods and beverages have 200 mg or more of potassium per serving: Apricots (raw) - 2 have 200 mg of potassium. Apricots (dry) - 5 have 200 mg of potassium. Artichoke - 1 medium has 345 mg of potassium. Avocado -  fruit has 245 mg of potassium. Banana - 1 medium fruit has 425 mg of potassium. [place] or baked beans (canned) -  cup has 280 mg of potassium. White beans (canned) -  cup has 595 mg potassium. Beef roast - 3 oz has 320 mg of potassium. Ground beef - 3 oz has 270 mg of potassium. Beets (raw or cooked) -  cup has 260 mg of potassium. Bran muffin - 2 oz has 300 mg of potassium. Broccoli (cooked) -  cup has 230 mg of potassium. Brussels sprouts -  cup has 250 mg of potassium. Cantaloupe -  cup has 215 mg of potassium. Cereal, 100% bran -  cup has 200-400 mg of potassium. Cheeseburger -1 single fast food burger has 225-400 mg of potassium. Chicken - 3 oz has 220 mg of potassium. Clams (canned) - 3 oz has 535 mg of potassium. Crab - 3 oz has 225 mg of potassium. Dates - 5 have 270 mg of potassium. Dried beans and peas -  cup has 300-475 mg of potassium. Figs (dried) - 2 have 260 mg of potassium. Fish (halibut, tuna, cod, snapper) - 3 oz has 480 mg of potassium. Fish (salmon, haddock, swordfish, perch) - 3 oz has 300 mg of potassium. Fish (tuna, canned) - 3 oz has 200 mg of potassium. [place] fries (fast food) - 3 oz has 470 mg of potassium. Granola with fruit and nuts -  cup has 200 mg of  potassium. Grapefruit juice -  cup has 200 mg of potassium. Honeydew melon -  cup has 200 mg of potassium. Kale (raw) - 1 cup has 300 mg of potassium. Kiwi - 1 medium fruit has 240 mg of potassium. Kohlrabi, rutabaga, parsnips -  cup has 280 mg of potassium. Lentils -  cup has 365 mg of potassium. Mango - 1 each has 325 mg of potassium. Milk (nonfat, low-fat, whole, buttermilk) - 1 cup has 350-380 mg of potassium. Milk (chocolate) - 1 cup has 420 mg of potassium Molasses - 1 Tbsp has 295 mg of potassium. Mushrooms -  cup has 280 mg of potassium. Nectarine - 1 each has 275 mg of potassium. Nuts (almonds, peanuts, hazelnuts, [place], cashew, mixed) - 1 oz has 200 mg of potassium. Nuts (pistachios) - 1 oz has 295 mg of potassium. Orange - 1 fruit has 240 mg of  potassium. Orange juice -  cup has 235 mg of potassium. Papaya -  medium fruit has 390 mg of potassium. Peanut butter (chunky) - 2 Tbsp has 240 mg of potassium. Peanut butter (smooth) - 2 Tbsp has 210 mg of potassium. Pear - 1 medium (200 mg) of potassium. Pomegranate - 1 whole fruit has 400 mg of potassium. Pomegranate juice -  cup has 215 mg of potassium. Pork - 3 oz has 350 mg of potassium. Potato chips (salted) - 1 oz has 465 mg of potassium. Potato (baked with skin) - 1 medium has 925 mg of potassium. Potato (boiled) -  cup has 255 mg of potassium. Potato (Mashed) -  cup has 330 mg of potassium. Prune juice -  cup has 370 mg of potassium. Prunes - 5 have 305 mg of potassium. Pudding (chocolate) -  cup has 230 mg of potassium. Pumpkin (canned) -  cup has 250 mg of potassium. Raisins (seedless) -  cup has 270 mg of potassium. Seeds (sunflower or pumpkin) - 1 oz has 240 mg of potassium. Soy milk - 1 cup has 300 mg of potassium. Spinach (cooked) - 1/2 cup has 420 mg of potassium. Spinach (canned) -  cup has 370 mg of potassium. Sweet potato (baked with skin) - 1 medium has 450 mg of potassium. Swiss chard -   cup has 480 mg of potassium. Tomato or vegetable juice -  cup has 275 mg of potassium. Tomato (sauce or puree) -  cup has 400-550 mg of potassium. Tomato (raw) - 1 medium has 290 mg of potassium. Tomato (canned) -  cup has 200-300 mg of potassium. [place] - 3 oz has 250 mg of potassium. Wheat germ - 1 oz has 250 mg of potassium. Winter squash -  cup has 250 mg of potassium. Yogurt (plain or fruited) - 6 oz has 260-435 mg of potassium. Zucchini -  cup has 220 mg of potassium. Moderate in potassium The following foods and beverages have 50-200 mg of potassium per serving: Apple - 1 fruit has 150 mg of potassium Apple juice -  cup has 150 mg of potassium Applesauce -  cup has 90 mg of potassium Apricot nectar -  cup has 140 mg of potassium Asparagus (small spears) -  cup has 155 mg of potassium Asparagus (large spears) - 6 have 155 mg of potassium Bagel (cinnamon raisin) - 1 four-inch bagel has 130 mg of potassium Bagel (egg or plain) - 1 four- inch bagel has 70 mg of potassium Beans (green) -  cup has 90 mg of potassium Beans (yellow) -  cup has 190 mg of potassium Beer, regular - 12 oz has 100 mg of potassium Beets (canned) -  cup has 125 mg of potassium Blackberries -  cup has 115 mg of potassium Blueberries -  cup has 60 mg of potassium Bread (whole wheat) - 1 slice has 70 mg of potassium Broccoli (raw) -  cup has 145 mg of potassium Cabbage -  cup has 150 mg of potassium Carrots (cooked or raw) -  cup has 180 mg of potassium Cauliflower (raw) -  cup has 150 mg of potassium Celery (raw) -  cup has 155 mg of potassium Cereal, bran flakes -  cup has 120-150 mg of potassium Cheese (cottage) -  cup has 110 mg of potassium Cherries - 10 have 150 mg of potassium Chocolate - 1 oz bar has 165 mg of potassium Coffee (brewed) - 6 oz has 90 mg of potassium  Corn -  cup or 1 ear has 195 mg of potassium Cucumbers -  cup has 80 mg of potassium Egg - 1 large egg has 60  mg of potassium Eggplant -  cup has 60 mg of potassium Endive (raw) -  cup has 80 mg of potassium English muffin - 1 has 65 mg of potassium Fish (ocean perch) - 3 oz has 192 mg of potassium Frankfurter, beef or pork - 1 has 75 mg of potassium Fruit cocktail -  cup has 115 mg of potassium Grape juice -  cup has 170 mg of potassium Grapefruit -  fruit has 175 mg of potassium Grapes -  cup has 155 mg of potassium Greens: kale, turnip, collard -  cup has 110-150 mg of potassium Ice cream or frozen yogurt (chocolate) -  cup has 175 mg of potassium Ice cream or frozen yogurt (vanilla) -  cup has 120-150 mg of potassium Lemons, limes - 1 each has 80 mg of potassium Lettuce - 1 cup has 100 mg of potassium Mixed vegetables -  cup has 150 mg of potassium Mushrooms, raw -  cup has 110 mg of potassium Nuts (walnuts, pecans, or macadamia) - 1 oz has 125 mg of potassium Oatmeal -  cup has 80 mg of potassium Okra -  cup has 110 mg of potassium Onions -  cup has 120 mg of potassium Peach - 1 has 185 mg of potassium Peaches (canned) -  cup has 120 mg of potassium Pears (canned) -  cup has 120 mg of potassium Peas, green (frozen) -  cup has 90 mg of potassium Peppers (Green) -  cup has 130 mg of potassium Peppers (Red) -  cup has 160 mg of potassium Pineapple juice -  cup has 165 mg of potassium Pineapple (fresh or canned) -  cup has 100 mg of potassium Plums - 1 has 105 mg of potassium Pudding, vanilla -  cup has 150 mg of potassium Raspberries -  cup has 90 mg of potassium Rhubarb -  cup has 115 mg of potassium Rice, wild -  cup has 80 mg of potassium Shrimp - 3 oz has 155 mg of potassium Spinach (raw) - 1 cup has 170 mg of potassium Strawberries -  cup has 125 mg of potassium Summer squash -  cup has 175-200 mg of potassium Swiss chard (raw) - 1 cup has 135 mg of potassium Tangerines - 1 fruit has 140 mg of potassium Tea, brewed - 6 oz has 65 mg of  potassium Turnips -  cup has 140 mg of potassium Watermelon -  cup has 85 mg of potassium Wine (Red, table) - 5 oz has 180 mg of potassium Wine (White, table) - 5 oz 100 mg of potassium Low in potassium The following foods and beverages have less than 50 mg of potassium per serving. Bread (white) - 1 slice has 30 mg of potassium Carbonated beverages - 12 oz has less than 5 mg of potassium Cheese - 1 oz has 20-30 mg of potassium Cranberries -  cup has 45 mg of potassium Cranberry juice cocktail -  cup has 20 mg of potassium Fats and oils - 1 Tbsp has less than 5 mg of potassium Hummus - 1 Tbsp has 32 mg of potassium Nectar (papaya, mango, or pear) -  cup has 35 mg of potassium Rice (white or brown) -  cup has 50 mg of potassium Spaghetti or macaroni (cooked) -  cup has 30 mg of  potassium Tortilla, flour or corn - 1 has 50 mg of potassium Waffle - 1 four-inch waffle has 50 mg of potassium Water chestnuts -  cup has 40 mg of potassium Summary Potassium is a mineral found in many foods and drinks. It affects how the heart works, and helps keep fluids and minerals balanced in the body. The amount of potassium you need each day depends on your age and any existing medical conditions you may have. Your health care provider or dietitian may recommend an amount of potassium that you should have each day. This information is not intended to replace advice given to you by your health care provider. Make sure you discuss any questions you have with your health care provider. Document Revised: 07/26/2017 Document Reviewed: 11/07/2016 Elsevier Patient Education  [place].

## 2020-07-05 NOTE — Assessment & Plan Note (Signed)
abx per orders flonase

## 2020-07-06 NOTE — Telephone Encounter (Signed)
Pt called. LVM and advised pt that someone called 12 days ago and to call back with further questions

## 2020-07-07 ENCOUNTER — Other Ambulatory Visit: Payer: Self-pay | Admitting: Internal Medicine

## 2020-07-07 ENCOUNTER — Telehealth: Payer: Self-pay | Admitting: *Deleted

## 2020-07-07 DIAGNOSIS — E1142 Type 2 diabetes mellitus with diabetic polyneuropathy: Secondary | ICD-10-CM

## 2020-07-07 DIAGNOSIS — E1165 Type 2 diabetes mellitus with hyperglycemia: Secondary | ICD-10-CM

## 2020-07-07 NOTE — Telephone Encounter (Signed)
Pt called requesting to push her f/u with CT appt back with Dr. Cyndia Bent until the beginning of the year. Pt states she will call to reschedule.

## 2020-07-09 ENCOUNTER — Other Ambulatory Visit: Payer: Self-pay | Admitting: Internal Medicine

## 2020-07-15 ENCOUNTER — Ambulatory Visit: Payer: 59 | Admitting: Physical Medicine and Rehabilitation

## 2020-07-15 LAB — HM DIABETES EYE EXAM

## 2020-07-18 ENCOUNTER — Encounter: Payer: Self-pay | Admitting: Hematology & Oncology

## 2020-07-19 ENCOUNTER — Telehealth: Payer: Self-pay

## 2020-07-19 NOTE — Telephone Encounter (Signed)
She really needs to go to ER if she has sob and chest pain----even if the ekg Is normal if she is having those symptoms we would send her to er

## 2020-07-19 NOTE — Telephone Encounter (Signed)
She can see someone tomorrow or me next week

## 2020-07-19 NOTE — Telephone Encounter (Signed)
Nurse Assessment Nurse: Hassell Done, RN, Melanie Date/Time Eilene Ghazi Time): 07/18/2020 4:17:44 PM Confirm and document reason for call. If symptomatic, describe symptoms. ---Caller states she is having some chest pain and SOB and she is missing part of a lung . She is post covid and it gets worse when she argues. She wants to go in for EKG to make sure things are ok. Does the patient have any new or worsening symptoms? ---Yes Will a triage be completed? ---Yes Related visit to physician within the last 2 weeks? ---Yes Does the PT have any chronic conditions? (i.e. diabetes, asthma, this includes High risk factors for pregnancy, etc.) ---Yes List chronic conditions. ---diabetes high blood pressure Is this a behavioral health or substance abuse call? ---No Guidelines Guideline Title Affirmed Question Affirmed Notes Nurse Date/Time Eilene Ghazi Time) Chest Pain [1] Chest pain lasts > 5 minutes AND [2] age > 69 Hassell Done, [place], Threasa Beards 07/18/2020 4:21:50 PM Disp. Time Eilene Ghazi Time) Disposition Final User 07/18/2020 4:16:13 PM Send to Urgent Asa Saunas 07/18/2020 4:25:24 PM 911 Outcome Documentation Hassell Done, RN, Threasa Beards Reason: Caller refused to call 911 and said her symptoms could be "Post-Covid" 07/18/2020 4:24:32 PM Call EMS 911 Now Yes Hassell Done, RN, Melanie PLEASE NOTE: All timestamps contained within this report are represented as [place] Standard Time. CONFIDENTIALTY NOTICE: This fax transmission is intended only for the addressee. It contains information that is legally privileged, confidential or otherwise protected from use or disclosure. If you are not the intended recipient, you are strictly prohibited from reviewing, disclosing, copying using or disseminating any of this information or taking any action in reliance on or regarding this information. If you have received this fax in error, please notify us immediately by telephone so that we can arrange for its return to [place]. Phone:  [phone], Toll-Free: [phone], Fax: [phone] Page: 2 of 2 Call Id: [id number] Victor Disagree/Comply Disagree Caller Understands Yes PreDisposition Call Doctor Care Advice Given Per Guideline CALL EMS 911 NOW: * Immediate medical attention is needed. You need to hang up and call 911 (or an ambulance). NOTE TO TRIAGER - IF CALLER ASKS ABOUT ASPIRIN: * Call EMS 911 first. CARE ADVICE given per Chest Pain (Adult) guideline. Referrals GO TO FACILITY REFUSED

## 2020-07-19 NOTE — Telephone Encounter (Signed)
Pt refused to call 911 and refused to go to the ED

## 2020-07-19 NOTE — Telephone Encounter (Signed)
Spoke with patient. Advised to go to the ED. Pt refused to go to ED. Pt states not wanting to expose to COVID or catching pneumonia again. Pt states pain is not as bad as it was yesterday. Pt states having chest congestion, SOB, left chest pain.

## 2020-07-20 ENCOUNTER — Other Ambulatory Visit: Payer: 59

## 2020-07-20 ENCOUNTER — Ambulatory Visit: Payer: 59 | Admitting: Surgery

## 2020-07-20 NOTE — Telephone Encounter (Signed)
Pt called. Left VM. Advised pt to call back to schedule a appointment and advised if sxs come back or get worse to go to the ED per Dr. Etter Sjogren

## 2020-08-02 ENCOUNTER — Other Ambulatory Visit: Payer: Self-pay | Admitting: Internal Medicine
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
## 2020-08-04 ENCOUNTER — Ambulatory Visit: Payer: 59 | Admitting: Family Medicine

## 2020-08-04 ENCOUNTER — Other Ambulatory Visit: Payer: Self-pay

## 2020-08-04 VITALS — BP 146/88 | HR 99 | Temp 97.8°F | Resp 18 | Ht 70.0 in | Wt 276.2 lb

## 2020-08-04 DIAGNOSIS — N951 Menopausal and female climacteric states: Secondary | ICD-10-CM

## 2020-08-04 DIAGNOSIS — R079 Chest pain, unspecified: Secondary | ICD-10-CM

## 2020-08-04 DIAGNOSIS — R002 Palpitations: Secondary | ICD-10-CM

## 2020-08-04 DIAGNOSIS — I10 Essential (primary) hypertension: Secondary | ICD-10-CM

## 2020-08-04 MED ORDER — NYSTATIN 100000 UNIT/GM EX OINT: 1.0000 "application " | TOPICAL_OINTMENT | Freq: Two times a day (BID) | CUTANEOUS | 0 refills | Status: DC

## 2020-08-04 MED ORDER — TRIAMCINOLONE ACETONIDE 0.1 % EX OINT: 1.0000 "application " | TOPICAL_OINTMENT | Freq: Two times a day (BID) | CUTANEOUS | 0 refills | Status: DC

## 2020-08-04 NOTE — Patient Instructions (Signed)

## 2020-08-04 NOTE — Progress Notes (Signed)
Patient ID: Kimberly [name], female    DOB: Oct 10, 1960  Age: 59 y.o. MRN: [id number]    Subjective:  Subjective   Kimberly [name] presents for f/u bp .    She also c/o abd bloating and heartburn ---- relieved with carafate but she also c/o palpitations frequently and pressure.   No sob  Review of Systems  Constitutional: Negative for appetite change, diaphoresis, fatigue and unexpected weight change.  Eyes: Negative for pain, redness and visual disturbance.  Respiratory: Negative for cough, chest tightness, shortness of breath and wheezing.   Cardiovascular: Positive for chest pain and palpitations. Negative for leg swelling.  Gastrointestinal: Negative for abdominal pain.  Endocrine: Negative for cold intolerance, heat intolerance, polydipsia, polyphagia and polyuria.  Genitourinary: Negative for difficulty urinating, dysuria and frequency.  Neurological: Negative for dizziness, light-headedness, numbness and headaches.    History Past Medical History:  Diagnosis Date  . Anxiety    takes Ativan as needed  . Bronchitis    hx of;last time > 37yrago  . Cancer ([facility]    part of left lower lung removed- lung ca 2013  . Colitis   . Colon polyps    hx of  . Complication of anesthesia    gas and MAC procedures  . Depression    takes Wellbutrin daily  . Diabetes mellitus    Novolog and Levemir daily  . DM2 (diabetes mellitus, type 2) ([facility]erg[facility]   . Eczema   . GERD (gastroesophageal reflux disease)    takes Protonix nightly  . Headache(784.0)    stress HA frequently  . IBS (irritable bowel syndrome)   . Lung mass    left upper lobe  . Pneumonia    walking pneumonia in 2007  . PONV (postoperative nausea and vomiting)   . Shortness of breath    certain times of the year  . Stress incontinence     She has a past surgical history that includes CAESAREAN SECTION (96/98/2000); Rectal fistula (1993); Uterine fibroid surgery; colonosocpy; Biopsy thyroid; and Thoracotomy (10/26/2011).    Her family history includes Anesthesia problems in her sister; Breast cancer in her maternal aunt and another family member; Cancer in her father, maternal grandfather, and mother; Diabetes in her mother; Heart disease in her maternal grandfather and maternal grandmother; Hyperlipidemia in her maternal grandfather and mother; Migraines in her sister.She reports that she quit smoking about 37 years ago. Her smoking use included cigarettes. She has never used smokeless tobacco. She reports that she does not drink alcohol and does not use drugs.  Current Outpatient Medications on File Prior to Visit  Medication Sig Dispense Refill  . ACCU-CHEK FASTCLIX LANCETS MISC   0  . albuterol (PROVENTIL) (2.5 MG/3ML) 0.083% nebulizer solution Take 3 mLs (2.5 mg total) by nebulization every 6 (six) hours as needed for wheezing or shortness of breath. 150 mL 1  . buPROPion (WELLBUTRIN XL) 150 MG 24 hr tablet TAKE 1 TABLET(150 MG) BY MOUTH EVERY EVENING.  Need ov before any more refills. (Patient taking differently: Take 150 mg by mouth every evening.) 90 tablet 0  . Continuous Blood Gluc Sensor (FREESTYLE LIBRE 2 SENSOR) MISC INJECT 1 SENSOR INTO SKIN EVERY 14 DAYS 2 each 3  . diclofenac Sodium (VOLTAREN) 1 % GEL Apply 2 g topically 4 (four) times daily. 100 g 3  . doxycycline (VIBRA-TABS) 100 MG tablet Take 1 tablet (100 mg total) by mouth 2 (two) times daily. 20 tablet 0  . fluticasone (FLONASE) 50 MCG/ACT  nasal spray Place 2 sprays into both nostrils daily. 16 g 6  . glucose blood (ACCU-CHEK GUIDE) test strip Use as instructed 4 times a day. 400 each 12  . hydrochlorothiazide (HYDRODIURIL) 25 MG tablet Take 1 tablet (25 mg total) by mouth daily. 90 tablet 3  . insulin aspart (NOVOLOG FLEXPEN) 100 UNIT/ML injection Inject 18-25 Units into the skin 3 (three) times daily with meals. Inject 18-25 units before meals 67.5 mL 1  . LANTUS SOLOSTAR 100 UNIT/ML Solostar Pen ADMINISTER 50 UNITS UNDER THE SKIN IN THE  MORNING AND AT BEDTIME 45 mL 1  . LORazepam (ATIVAN) 0.5 MG tablet Take 1 tablet (0.5 mg total) by mouth 2 (two) times daily as needed. for anxiety 60 tablet 1  . metFORMIN (GLUCOPHAGE-XR) 500 MG 24 hr tablet TAKE 2 TABLETS(1,000 MG) TOTAL) BY MOUTH TWICE DAILY 360 tablet 0  . NOVOLOG FLEXPEN 100 UNIT/ML FlexPen SMARTSIG:18-25 Unit(s) SUB-Q    . ondansetron (ZOFRAN ODT) 4 MG disintegrating tablet 46m ODT q8 hours prn nausea/vomit 20 tablet 2  . pantoprazole (PROTONIX) 40 MG tablet TAKE 1 TABLET(40 MG) BY MOUTH TWICE DAILY.  Needs ov before any more refills 180 tablet 2  . Vitamin D, Ergocalciferol, (DRISDOL) 1.25 MG (50000 UNIT) CAPS capsule Take 1 capsule (50,000 Units total) by mouth every 7 (seven) days. 7 capsule 0   No current facility-administered medications on file prior to visit.     Objective:  Objective  Physical Exam Vitals and nursing note reviewed.  Constitutional:      Appearance: She is well-developed and well-nourished.  HENT:     Head: Normocephalic and atraumatic.  Eyes:     Extraocular Movements: EOM normal.     Conjunctiva/sclera: Conjunctivae normal.  Neck:     Thyroid: No thyromegaly.     Vascular: No carotid bruit or JVD.  Cardiovascular:     Rate and Rhythm: Normal rate and regular rhythm.     Heart sounds: Normal heart sounds. No murmur heard.   Pulmonary:     Effort: Pulmonary effort is normal. No respiratory distress.     Breath sounds: Normal breath sounds. No wheezing or rales.  Chest:     Chest wall: No tenderness.  Abdominal:     Tenderness: There is no guarding or rebound.  Musculoskeletal:        General: No edema.     Cervical back: Normal range of motion and neck supple.  Neurological:     Mental Status: She is alert and oriented to person, place, and time.  Psychiatric:        Mood and Affect: Mood and affect normal.    BP (!) 146/88 (BP Location: Right Arm, Patient Position: Sitting, Cuff Size: Large)   Pulse 99   Temp 97.8 F  (36.6 C) (Oral)   Resp 18   Ht _0  (1.778 m)   Wt 276 lb 3.2 oz (125.3 kg)   SpO2 97%   BMI 39.63 kg/m  Wt Readings from Last 3 Encounters:  08/04/20 276 lb 3.2 oz (125.3 kg)  07/05/20 282 lb 3.2 oz (128 kg)  06/24/20 282 lb (127.9 kg)     Lab Results  Component Value Date   WBC 4.4 06/24/2020   HGB 13.7 06/24/2020   HCT 42.6 06/24/2020   PLT 116 (L) 06/24/2020   GLUCOSE 289 (H) 06/24/2020   CHOL 235 (H) 06/03/2020   TRIG 219 (H) 06/03/2020   HDL 68 06/03/2020   LDLCALC 129 (H) 06/03/2020  ALT 27 06/24/2020   AST 25 06/24/2020   NA 135 06/24/2020   K 4.1 06/24/2020   CL 97 (L) 06/24/2020   CREATININE 0.75 06/24/2020   BUN 14 06/24/2020   CO2 31 06/24/2020   TSH 0.97 02/18/2020   INR 1.2 10/19/2019   HGBA1C 8.6 (A) 06/03/2020   MICROALBUR 1.5 04/10/2018    DG HIPS BILAT WITH PELVIS 2V  Result Date: 06/04/2020 CLINICAL DATA:  Hip pain EXAM: DG HIP (WITH OR WITHOUT PELVIS) 2V BILAT COMPARISON:  November 11, 2015 FINDINGS: No acute fracture or dislocation. There are mild degenerative changes of bilateral hips with mild joint space narrowing and osteophyte formation. No area of erosion or osseous destruction. No unexpected radiopaque foreign body. Soft tissues are unremarkable. IMPRESSION: Mild degenerative changes of bilateral hips. Electronically Signed   By: Valentino Saxon MD   On: 06/04/2020 20:49     Assessment & Plan:  Plan  I am having Aditi L. Shankles start on nystatin ointment and triamcinolone ointment. I am also having her maintain her Accu-Chek FastClix Lancets, albuterol, buPROPion, LORazepam, pantoprazole, Accu-Chek Guide, insulin aspart, NovoLOG FlexPen, diclofenac Sodium, Vitamin D (Ergocalciferol), hydrochlorothiazide, ondansetron, doxycycline, fluticasone, metFORMIN, Lantus SoloStar, and FreeStyle Libre 2 Sensor.  Meds ordered this encounter  Medications  . nystatin ointment (MYCOSTATIN)    Sig: Apply 1 application topically 2 (two) times daily.     Dispense:  30 g    Refill:  0  . triamcinolone ointment (KENALOG) 0.1 %    Sig: Apply 1 application topically 2 (two) times daily.    Dispense:  30 g    Refill:  0    Problem List Items Addressed This Visit      Unprioritized   Chest pain - Primary   Relevant Medications   nystatin ointment (MYCOSTATIN)   triamcinolone ointment (KENALOG) 0.1 %   Other Relevant Orders   EKG 12-Lead (Completed)   Palpitations    ekg ---  Short pr,  Otherwise normal  Check echo and event monitor  Er if symptoms worsen      Relevant Orders   ECHOCARDIOGRAM COMPLETE   Cardiac event monitor   Primary hypertension    Well controlled, no changes to meds. Encouraged heart healthy diet such as the DASH diet and exercise as tolerated.       Vaginal dryness, menopausal    Refill creams previously rx by gyn      Relevant Medications   nystatin ointment (MYCOSTATIN)   triamcinolone ointment (KENALOG) 0.1 %      Follow-up: Return in about 6 months (around 02/02/2021), or if symptoms worsen or fail to improve.  [name], DO

## 2020-08-05 ENCOUNTER — Encounter: Payer: Self-pay | Admitting: Family Medicine

## 2020-08-05 ENCOUNTER — Telehealth: Payer: Self-pay

## 2020-08-05 ENCOUNTER — Encounter: Payer: Self-pay | Admitting: Internal Medicine

## 2020-08-05 DIAGNOSIS — N951 Menopausal and female climacteric states: Secondary | ICD-10-CM | POA: Insufficient documentation

## 2020-08-05 DIAGNOSIS — R002 Palpitations: Secondary | ICD-10-CM | POA: Insufficient documentation

## 2020-08-05 DIAGNOSIS — E1142 Type 2 diabetes mellitus with diabetic polyneuropathy: Secondary | ICD-10-CM

## 2020-08-05 HISTORY — DX: Menopausal and female climacteric states: N95.1

## 2020-08-05 NOTE — Assessment & Plan Note (Signed)
Well controlled, no changes to meds. Encouraged heart healthy diet such as the DASH diet and exercise as tolerated.  °

## 2020-08-05 NOTE — Assessment & Plan Note (Signed)
ekg ---  Short pr,  Otherwise normal  Check echo and event monitor  Er if symptoms worsen

## 2020-08-05 NOTE — Telephone Encounter (Signed)
Patient is ok with crestor, would like #90 day supply of generic formulation called in to [facility] on file. Please advise. Landis Gandy, RN

## 2020-08-05 NOTE — Assessment & Plan Note (Signed)
Refill creams previously rx by gyn

## 2020-08-05 NOTE — Telephone Encounter (Signed)
Note not needed 

## 2020-08-09 ENCOUNTER — Telehealth: Payer: Self-pay | Admitting: *Deleted

## 2020-08-09 ENCOUNTER — Other Ambulatory Visit: Payer: Self-pay | Admitting: *Deleted

## 2020-08-09 DIAGNOSIS — R002 Palpitations: Secondary | ICD-10-CM

## 2020-08-09 NOTE — Telephone Encounter (Signed)
Patient called about her echo.  Advised patient that we are waiting on authorization from ins.  Also when looking at this the referral coordinator needed referral to cardiology for the cardiac event monitor.  Referral placed for it as well.

## 2020-08-11 ENCOUNTER — Other Ambulatory Visit: Payer: Self-pay

## 2020-08-11 ENCOUNTER — Ambulatory Visit (HOSPITAL_COMMUNITY): Payer: 59 | Attending: Cardiology

## 2020-08-11 DIAGNOSIS — R002 Palpitations: Secondary | ICD-10-CM | POA: Diagnosis present

## 2020-08-11 LAB — ECHOCARDIOGRAM COMPLETE
Area-P 1/2: 3.31 cm2
S' Lateral: 2.7 cm

## 2020-08-11 MED ORDER — ROSUVASTATIN CALCIUM 5 MG PO TABS: 5.0000 mg | ORAL_TABLET | Freq: Every day | ORAL | 4 refills | Status: DC

## 2020-08-11 NOTE — Addendum Note (Signed)
Addended by: Lauralyn Primes on: 08/11/2020 11:28 AM   Modules accepted: Orders

## 2020-08-15 ENCOUNTER — Other Ambulatory Visit: Payer: Self-pay

## 2020-08-15 DIAGNOSIS — Z9889 Other specified postprocedural states: Secondary | ICD-10-CM | POA: Insufficient documentation

## 2020-08-15 DIAGNOSIS — J189 Pneumonia, unspecified organism: Secondary | ICD-10-CM | POA: Insufficient documentation

## 2020-08-15 DIAGNOSIS — C801 Malignant (primary) neoplasm, unspecified: Secondary | ICD-10-CM | POA: Insufficient documentation

## 2020-08-15 DIAGNOSIS — K219 Gastro-esophageal reflux disease without esophagitis: Secondary | ICD-10-CM | POA: Insufficient documentation

## 2020-08-15 DIAGNOSIS — D259 Leiomyoma of uterus, unspecified: Secondary | ICD-10-CM

## 2020-08-15 DIAGNOSIS — K529 Noninfective gastroenteritis and colitis, unspecified: Secondary | ICD-10-CM | POA: Insufficient documentation

## 2020-08-15 DIAGNOSIS — J40 Bronchitis, not specified as acute or chronic: Secondary | ICD-10-CM | POA: Insufficient documentation

## 2020-08-15 DIAGNOSIS — N393 Stress incontinence (female) (male): Secondary | ICD-10-CM | POA: Insufficient documentation

## 2020-08-15 DIAGNOSIS — F329 Major depressive disorder, single episode, unspecified: Secondary | ICD-10-CM | POA: Insufficient documentation

## 2020-08-15 DIAGNOSIS — K635 Polyp of colon: Secondary | ICD-10-CM | POA: Insufficient documentation

## 2020-08-15 DIAGNOSIS — F32A Depression, unspecified: Secondary | ICD-10-CM | POA: Insufficient documentation

## 2020-08-15 DIAGNOSIS — F419 Anxiety disorder, unspecified: Secondary | ICD-10-CM | POA: Insufficient documentation

## 2020-08-15 DIAGNOSIS — R918 Other nonspecific abnormal finding of lung field: Secondary | ICD-10-CM | POA: Insufficient documentation

## 2020-08-15 DIAGNOSIS — K589 Irritable bowel syndrome without diarrhea: Secondary | ICD-10-CM | POA: Insufficient documentation

## 2020-08-15 DIAGNOSIS — L309 Dermatitis, unspecified: Secondary | ICD-10-CM | POA: Insufficient documentation

## 2020-08-15 DIAGNOSIS — R0602 Shortness of breath: Secondary | ICD-10-CM | POA: Insufficient documentation

## 2020-08-15 DIAGNOSIS — T8859XA Other complications of anesthesia, initial encounter: Secondary | ICD-10-CM | POA: Insufficient documentation

## 2020-08-15 DIAGNOSIS — R112 Nausea with vomiting, unspecified: Secondary | ICD-10-CM | POA: Insufficient documentation

## 2020-08-15 HISTORY — DX: Leiomyoma of uterus, unspecified: D25.9

## 2020-08-16 ENCOUNTER — Ambulatory Visit: Payer: Self-pay

## 2020-08-16 ENCOUNTER — Ambulatory Visit (INDEPENDENT_AMBULATORY_CARE_PROVIDER_SITE_OTHER): Payer: 59 | Admitting: Rheumatology

## 2020-08-16 ENCOUNTER — Encounter: Payer: Self-pay | Admitting: Rheumatology

## 2020-08-16 ENCOUNTER — Other Ambulatory Visit: Payer: Self-pay

## 2020-08-16 VITALS — BP 137/79 | HR 88 | Resp 16 | Ht 70.0 in | Wt 273.0 lb

## 2020-08-16 DIAGNOSIS — M2550 Pain in unspecified joint: Secondary | ICD-10-CM

## 2020-08-16 DIAGNOSIS — R7982 Elevated C-reactive protein (CRP): Secondary | ICD-10-CM

## 2020-08-16 DIAGNOSIS — E1165 Type 2 diabetes mellitus with hyperglycemia: Secondary | ICD-10-CM

## 2020-08-16 DIAGNOSIS — M542 Cervicalgia: Secondary | ICD-10-CM | POA: Diagnosis not present

## 2020-08-16 DIAGNOSIS — K219 Gastro-esophageal reflux disease without esophagitis: Secondary | ICD-10-CM

## 2020-08-16 DIAGNOSIS — E785 Hyperlipidemia, unspecified: Secondary | ICD-10-CM

## 2020-08-16 DIAGNOSIS — R5383 Other fatigue: Secondary | ICD-10-CM

## 2020-08-16 DIAGNOSIS — F418 Other specified anxiety disorders: Secondary | ICD-10-CM

## 2020-08-16 DIAGNOSIS — G8929 Other chronic pain: Secondary | ICD-10-CM

## 2020-08-16 DIAGNOSIS — T17308A Unspecified foreign body in larynx causing other injury, initial encounter: Secondary | ICD-10-CM

## 2020-08-16 DIAGNOSIS — G43019 Migraine without aura, intractable, without status migrainosus: Secondary | ICD-10-CM

## 2020-08-16 DIAGNOSIS — E1142 Type 2 diabetes mellitus with diabetic polyneuropathy: Secondary | ICD-10-CM

## 2020-08-16 DIAGNOSIS — M19042 Primary osteoarthritis, left hand: Secondary | ICD-10-CM

## 2020-08-16 DIAGNOSIS — M791 Myalgia, unspecified site: Secondary | ICD-10-CM

## 2020-08-16 DIAGNOSIS — R7689 Other specified abnormal immunological findings in serum: Secondary | ICD-10-CM

## 2020-08-16 DIAGNOSIS — M7062 Trochanteric bursitis, left hip: Secondary | ICD-10-CM

## 2020-08-16 DIAGNOSIS — M7711 Lateral epicondylitis, right elbow: Secondary | ICD-10-CM

## 2020-08-16 DIAGNOSIS — M545 Low back pain, unspecified: Secondary | ICD-10-CM

## 2020-08-16 DIAGNOSIS — I10 Essential (primary) hypertension: Secondary | ICD-10-CM

## 2020-08-16 DIAGNOSIS — R768 Other specified abnormal immunological findings in serum: Secondary | ICD-10-CM

## 2020-08-16 DIAGNOSIS — Z85118 Personal history of other malignant neoplasm of bronchus and lung: Secondary | ICD-10-CM

## 2020-08-16 DIAGNOSIS — R911 Solitary pulmonary nodule: Secondary | ICD-10-CM

## 2020-08-16 DIAGNOSIS — M19041 Primary osteoarthritis, right hand: Secondary | ICD-10-CM

## 2020-08-16 DIAGNOSIS — Z87891 Personal history of nicotine dependence: Secondary | ICD-10-CM

## 2020-08-16 DIAGNOSIS — K760 Fatty (change of) liver, not elsewhere classified: Secondary | ICD-10-CM

## 2020-08-16 DIAGNOSIS — Z8616 Personal history of COVID-19: Secondary | ICD-10-CM

## 2020-08-16 NOTE — Patient Instructions (Signed)
Iliotibial Band Syndrome Rehab Ask your health care provider which exercises are safe for you. Do exercises exactly as told by your health care provider and adjust them as directed. It is normal to feel mild stretching, pulling, tightness, or discomfort as you do these exercises. Stop right away if you feel sudden pain or your pain gets significantly worse. Do not begin these exercises until told by your health care provider. Stretching and range-of-motion exercises These exercises warm up your muscles and joints and improve the movement and flexibility of your hip and pelvis. Quadriceps stretch, prone  1. Lie on your abdomen on a firm surface, such as a bed or padded floor (prone position). 2. Bend your left / right knee and reach back to hold your ankle or pant leg. If you cannot reach your ankle or pant leg, loop a belt around your foot and grab the belt instead. 3. Gently pull your heel toward your buttocks. Your knee should not slide out to the side. You should feel a stretch in the front of your thigh and knee (quadriceps). 4. Hold this position for __________ seconds. Repeat __________ times. Complete this exercise __________ times a day. Iliotibial band stretch An iliotibial band is a strong band of muscle tissue that runs from the outer side of your hip to the outer side of your thigh and knee. 1. Lie on your side with your left / right leg in the top position. 2. Bend both of your knees and grab your left / right ankle. Stretch out your bottom arm to help you balance. 3. Slowly bring your top knee back so your thigh goes behind your trunk. 4. Slowly lower your top leg toward the floor until you feel a gentle stretch on the outside of your left / right hip and thigh. If you do not feel a stretch and your knee will not fall farther, place the heel of your other foot on top of your knee and pull your knee down toward the floor with your foot. 5. Hold this position for __________  seconds. Repeat __________ times. Complete this exercise __________ times a day. Strengthening exercises These exercises build strength and endurance in your hip and pelvis. Endurance is the ability to use your muscles for a long time, even after they get tired. Straight leg raises, side-lying This exercise strengthens the muscles that rotate the leg at the hip and move it away from your body (hip abductors). 1. Lie on your side with your left / right leg in the top position. Lie so your head, shoulder, hip, and knee line up. You may bend your bottom knee to help you balance. 2. Roll your hips slightly forward so your hips are stacked directly over each other and your left / right knee is facing forward. 3. Tense the muscles in your outer thigh and lift your top leg 4-6 inches (10-15 cm). 4. Hold this position for __________ seconds. 5. Slowly return to the starting position. Let your muscles relax completely before doing another repetition. Repeat __________ times. Complete this exercise __________ times a day. Leg raises, prone This exercise strengthens the muscles that move the hips (hip extensors). 1. Lie on your abdomen on your bed or a firm surface. You can put a pillow under your hips if that is more comfortable for your lower back. 2. Bend your left / right knee so your foot is straight up in the air. 3. Squeeze your buttocks muscles and lift your left / right thigh  off the bed. Do not let your back arch. 4. Tense your thigh muscle as hard as you can without increasing any knee pain. 5. Hold this position for __________ seconds. 6. Slowly lower your leg to the starting position and allow it to relax completely. Repeat __________ times. Complete this exercise __________ times a day. Hip hike 1. Stand sideways on a bottom step. Stand on your left / right leg with your other foot unsupported next to the step. You can hold on to the railing or wall for balance if needed. 2. Keep your knees  straight and your torso square. Then lift your left / right hip up toward the ceiling. 3. Slowly let your left / right hip lower toward the floor, past the starting position. Your foot should get closer to the floor. Do not lean or bend your knees. Repeat __________ times. Complete this exercise __________ times a day. This information is not intended to replace advice given to you by your health care provider. Make sure you discuss any questions you have with your health care provider. Document Revised: 12/04/2018 Document Reviewed: 06/04/2018 Elsevier Patient Education  [place]. Cervical Strain and Sprain Rehab Ask your health care provider which exercises are safe for you. Do exercises exactly as told by your health care provider and adjust them as directed. It is normal to feel mild stretching, pulling, tightness, or discomfort as you do these exercises. Stop right away if you feel sudden pain or your pain gets worse. Do not begin these exercises until told by your health care provider. Stretching and range-of-motion exercises Cervical side bending  5. Using good posture, sit on a stable chair or stand up. 6. Without moving your shoulders, slowly tilt your left / right ear to your shoulder until you feel a stretch in the opposite side neck muscles. You should be looking straight ahead. 7. Hold for __________ seconds. 8. Repeat with the other side of your neck. Repeat __________ times. Complete this exercise __________ times a day. Cervical rotation  6. Using good posture, sit on a stable chair or stand up. 7. Slowly turn your head to the side as if you are looking over your left / right shoulder. ? Keep your eyes level with the ground. ? Stop when you feel a stretch along the side and the back of your neck. 8. Hold for __________ seconds. 9. Repeat this by turning to your other side. Repeat __________ times. Complete this exercise __________ times a day. Thoracic extension and  pectoral stretch 6. Roll a towel or a small blanket so it is about 4 inches (10 cm) in diameter. 7. Lie down on your back on a firm surface. 8. Put the towel lengthwise, under your spine in the middle of your back. It should not be under your shoulder blades. The towel should line up with your spine from your middle back to your lower back. 9. Put your hands behind your head and let your elbows fall out to your sides. 10. Hold for __________ seconds. Repeat __________ times. Complete this exercise __________ times a day. Strengthening exercises Isometric upper cervical flexion 7. Lie on your back with a thin pillow behind your head and a small rolled-up towel under your neck. 8. Gently tuck your chin toward your chest and nod your head down to look toward your feet. Do not lift your head off the pillow. 9. Hold for __________ seconds. 10. Release the tension slowly. Relax your neck muscles completely before you  repeat this exercise. Repeat __________ times. Complete this exercise __________ times a day. Isometric cervical extension  4. Stand about 6 inches (15 cm) away from a wall, with your back facing the wall. 5. Place a soft object, about 6-8 inches (15-20 cm) in diameter, between the back of your head and the wall. A soft object could be a small pillow, a ball, or a folded towel. 6. Gently tilt your head back and press into the soft object. Keep your jaw and forehead relaxed. 7. Hold for __________ seconds. 8. Release the tension slowly. Relax your neck muscles completely before you repeat this exercise. Repeat __________ times. Complete this exercise __________ times a day. Posture and body mechanics Body mechanics refers to the movements and positions of your body while you do your daily activities. Posture is part of body mechanics. Good posture and healthy body mechanics can help to relieve stress in your body's tissues and joints. Good posture means that your spine is in its natural  S-curve position (your spine is neutral), your shoulders are pulled back slightly, and your head is not tipped forward. The following are general guidelines for applying improved posture and body mechanics to your everyday activities. Sitting  1. When sitting, keep your spine neutral and keep your feet flat on the floor. Use a footrest, if necessary, and keep your thighs parallel to the floor. Avoid rounding your shoulders, and avoid tilting your head forward. 2. When working at a desk or a computer, keep your desk at a height where your hands are slightly lower than your elbows. Slide your chair under your desk so you are close enough to maintain good posture. 3. When working at a computer, place your monitor at a height where you are looking straight ahead and you do not have to tilt your head forward or downward to look at the screen. Standing   When standing, keep your spine neutral and keep your feet about hip-width apart. Keep a slight bend in your knees. Your ears, shoulders, and hips should line up.  When you do a task in which you stand in one place for a long time, place one foot up on a stable object that is 2-4 inches (5-10 cm) high, such as a footstool. This helps keep your spine neutral. Resting When lying down and resting, avoid positions that are most painful for you. Try to support your neck in a neutral position. You can use a contour pillow or a small rolled-up towel. Your pillow should support your neck but not push on it. This information is not intended to replace advice given to you by your health care provider. Make sure you discuss any questions you have with your health care provider. Document Revised: 12/03/2018 Document Reviewed: 05/14/2018 Elsevier Patient Education  2020 [place]. Back Exercises The following exercises strengthen the muscles that help to support the trunk and back. They also help to keep the lower back flexible. Doing these exercises can help to  prevent back pain or lessen existing pain.  If you have back pain or discomfort, try doing these exercises 2-3 times each day or as told by your health care provider.  As your pain improves, do them once each day, but increase the number of times that you repeat the steps for each exercise (do more repetitions).  To prevent the recurrence of back pain, continue to do these exercises once each day or as told by your health care provider. Do exercises exactly as told  by your health care provider and adjust them as directed. It is normal to feel mild stretching, pulling, tightness, or discomfort as you do these exercises, but you should stop right away if you feel sudden pain or your pain gets worse. Exercises Single knee to chest Repeat these steps 3-5 times for each leg: 1. Lie on your back on a firm bed or the floor with your legs extended. 2. Bring one knee to your chest. Your other leg should stay extended and in contact with the floor. 3. Hold your knee in place by grabbing your knee or thigh with both hands and hold. 4. Pull on your knee until you feel a gentle stretch in your lower back or buttocks. 5. Hold the stretch for 10-30 seconds. 6. Slowly release and straighten your leg. Pelvic tilt Repeat these steps 5-10 times: 1. Lie on your back on a firm bed or the floor with your legs extended. 2. Bend your knees so they are pointing toward the ceiling and your feet are flat on the floor. 3. Tighten your lower abdominal muscles to press your lower back against the floor. This motion will tilt your pelvis so your tailbone points up toward the ceiling instead of pointing to your feet or the floor. 4. With gentle tension and even breathing, hold this position for 5-10 seconds. Cat-cow Repeat these steps until your lower back becomes more flexible: 1. Get into a hands-and-knees position on a firm surface. Keep your hands under your shoulders, and keep your knees under your hips. You may place  padding under your knees for comfort. 2. Let your head hang down toward your chest. Contract your abdominal muscles and point your tailbone toward the floor so your lower back becomes rounded like the back of a cat. 3. Hold this position for 5 seconds. 4. Slowly lift your head, let your abdominal muscles relax and point your tailbone up toward the ceiling so your back forms a sagging arch like the back of a cow. 5. Hold this position for 5 seconds.  Press-ups Repeat these steps 5-10 times: 1. Lie on your abdomen (face-down) on the floor. 2. Place your palms near your head, about shoulder-width apart. 3. Keeping your back as relaxed as possible and keeping your hips on the floor, slowly straighten your arms to raise the top half of your body and lift your shoulders. Do not use your back muscles to raise your upper torso. You may adjust the placement of your hands to make yourself more comfortable. 4. Hold this position for 5 seconds while you keep your back relaxed. 5. Slowly return to lying flat on the floor.  Bridges Repeat these steps 10 times: 1. Lie on your back on a firm surface. 2. Bend your knees so they are pointing toward the ceiling and your feet are flat on the floor. Your arms should be flat at your sides, next to your body. 3. Tighten your buttocks muscles and lift your buttocks off the floor until your waist is at almost the same height as your knees. You should feel the muscles working in your buttocks and the back of your thighs. If you do not feel these muscles, slide your feet 1-2 inches farther away from your buttocks. 4. Hold this position for 3-5 seconds. 5. Slowly lower your hips to the starting position, and allow your buttocks muscles to relax completely. If this exercise is too easy, try doing it with your arms crossed over your chest. Abdominal crunches Repeat  these steps 5-10 times: 1. Lie on your back on a firm bed or the floor with your legs extended. 2. Bend  your knees so they are pointing toward the ceiling and your feet are flat on the floor. 3. Cross your arms over your chest. 4. Tip your chin slightly toward your chest without bending your neck. 5. Tighten your abdominal muscles and slowly raise your trunk (torso) high enough to lift your shoulder blades a tiny bit off the floor. Avoid raising your torso higher than that because it can put too much stress on your low back and does not help to strengthen your abdominal muscles. 6. Slowly return to your starting position. Back lifts Repeat these steps 5-10 times: 1. Lie on your abdomen (face-down) with your arms at your sides, and rest your forehead on the floor. 2. Tighten the muscles in your legs and your buttocks. 3. Slowly lift your chest off the floor while you keep your hips pressed to the floor. Keep the back of your head in line with the curve in your back. Your eyes should be looking at the floor. 4. Hold this position for 3-5 seconds. 5. Slowly return to your starting position. Contact a health care provider if:  Your back pain or discomfort gets much worse when you do an exercise.  Your worsening back pain or discomfort does not lessen within 2 hours after you exercise. If you have any of these problems, stop doing these exercises right away. Do not do them again unless your health care provider says that you can. Get help right away if:  You develop sudden, severe back pain. If this happens, stop doing the exercises right away. Do not do them again unless your health care provider says that you can. This information is not intended to replace advice given to you by your health care provider. Make sure you discuss any questions you have with your health care provider. Document Revised: 12/18/2018 Document Reviewed: 05/15/2018 Elsevier Patient Education  [place].

## 2020-08-17 ENCOUNTER — Encounter: Payer: Self-pay | Admitting: Cardiology

## 2020-08-17 ENCOUNTER — Ambulatory Visit (INDEPENDENT_AMBULATORY_CARE_PROVIDER_SITE_OTHER): Payer: 59 | Admitting: Cardiology

## 2020-08-17 ENCOUNTER — Other Ambulatory Visit: Payer: Self-pay

## 2020-08-17 VITALS — BP 150/86 | HR 82 | Ht 70.0 in | Wt 276.0 lb

## 2020-08-17 DIAGNOSIS — I10 Essential (primary) hypertension: Secondary | ICD-10-CM

## 2020-08-17 DIAGNOSIS — Z794 Long term (current) use of insulin: Secondary | ICD-10-CM

## 2020-08-17 DIAGNOSIS — R072 Precordial pain: Secondary | ICD-10-CM | POA: Diagnosis not present

## 2020-08-17 DIAGNOSIS — R0602 Shortness of breath: Secondary | ICD-10-CM | POA: Diagnosis not present

## 2020-08-17 DIAGNOSIS — R0789 Other chest pain: Secondary | ICD-10-CM

## 2020-08-17 DIAGNOSIS — E119 Type 2 diabetes mellitus without complications: Secondary | ICD-10-CM | POA: Diagnosis not present

## 2020-08-17 DIAGNOSIS — E782 Mixed hyperlipidemia: Secondary | ICD-10-CM

## 2020-08-17 LAB — CYCLIC CITRUL PEPTIDE ANTIBODY, IGG: Cyclic Citrullin Peptide Ab: <16 UNITS

## 2020-08-17 LAB — CK: Total CK: 56 U/L (ref 29–143)

## 2020-08-17 MED ORDER — METOPROLOL TARTRATE 100 MG PO TABS: 100.0000 mg | ORAL_TABLET | Freq: Once | ORAL | 0 refills | Status: DC

## 2020-08-17 MED ORDER — NITROGLYCERIN 0.4 MG SL SUBL: 0.4000 mg | SUBLINGUAL_TABLET | SUBLINGUAL | 6 refills | Status: AC | PRN

## 2020-08-17 NOTE — Patient Instructions (Signed)
Medication Instructions:  Your physician has recommended you make the following change in your medication:   Take Nitroglycerin as needed for chest pain.  *If you need a refill on your cardiac medications before your next appointment, please call your pharmacy*   Lab Work: Your physician recommends that you return for lab work in:1 week prior to your CT scan. If you have labs (blood work) drawn today and your tests are completely normal, you will receive your results only by: Marland Kitchen MyChart Message (if you have MyChart) OR . A paper copy in the mail If you have any lab test that is abnormal or we need to change your treatment, we will call you to review the results.   Testing/Procedures: Your cardiac CT will be scheduled at:   [facility] [place], [place] [address] [id number]   If scheduled at [facility], please arrive at the [facility] main entrance of [facility] 30 minutes prior to test start time. Proceed to the [facility] Radiology Department (first floor) to check-in and test prep.  Please follow these instructions carefully (unless otherwise directed):  On the Night Before the Test: . Be sure to Drink plenty of water. . Do not consume any caffeinated/decaffeinated beverages or chocolate 12 hours prior to your test. . Do not take any antihistamines 12 hours prior to your test.   On the Day of the Test: . Drink plenty of water. Do not drink any water within one hour of the test. . Do not eat any food 4 hours prior to the test. . You may take your regular medications prior to the test.  . Take metoprolol (Lopressor) two hours prior to test. . HOLD Furosemide/Hydrochlorothiazide morning of the test. . FEMALES- please wear underwire-free bra if available      After the Test: . Drink plenty of water. . After receiving IV contrast, you may experience a mild flushed feeling. This is normal. . On occasion, you may experience a  mild rash up to 24 hours after the test. This is not dangerous. If this occurs, you can take Benadryl 25 mg and increase your fluid intake. . If you experience trouble breathing, this can be serious. If it is severe call 911 IMMEDIATELY. If it is mild, please call our office. . If you take any of these medications: Glipizide/Metformin, Avandament, Glucavance, please do not take 48 hours after completing test unless otherwise instructed.   Once we have confirmed authorization from your insurance company, we will call you to set up a date and time for your test. Based on how quickly your insurance processes prior authorizations requests, please allow up to 4 weeks to be contacted for scheduling your Cardiac CT appointment. Be advised that routine Cardiac CT appointments could be scheduled as many as 8 weeks after your provider has ordered it.  For non-scheduling related questions, please contact the cardiac imaging nurse navigator should you have any questions/concerns: Marchia Bond, Cardiac Imaging Nurse Navigator Burley Saver, Interim Cardiac Imaging Nurse [place] [facility] Direct Office Dial: [phone]   For scheduling needs, including cancellations and rescheduling, please call Vivien Rota at [phone].     Follow-Up: At [facility], you and your health needs are our priority.  As part of our continuing mission to provide you with exceptional heart care, we have created designated Provider Care Teams.  These Care Teams include your primary Cardiologist (physician) and Advanced Practice Providers (APPs -  Physician  Assistants and Nurse Practitioners) who all work together to provide you with the care you need, when you need it.  We recommend signing up for the patient portal called "MyChart".  Sign up information is provided on this After Visit Summary.  MyChart is used to connect with patients for Virtual Visits (Telemedicine).  Patients are able to view lab/test  results, encounter notes, upcoming appointments, etc.  Non-urgent messages can be sent to your provider as well.   To learn more about what you can do with MyChart, go to [email or web address].    Your next appointment:   4 week(s)  The format for your next appointment:   In Person  Provider:   Rajan Revankar, MD   Other Instructions Cardiac CT Angiogram A cardiac CT angiogram is a procedure to look at the heart and the area around the heart. It may be done to help find the cause of chest pains or other symptoms of heart disease. During this procedure, a substance called contrast dye is injected into the blood vessels in the area to be checked. A large X-ray machine, called a CT scanner, then takes detailed pictures of the heart and the surrounding area. The procedure is also sometimes called a coronary CT angiogram, coronary artery scanning, or CTA. A cardiac CT angiogram allows the health care provider to see how well blood is flowing to and from the heart. The health care provider will be able to see if there are any problems, such as:  Blockage or narrowing of the coronary arteries in the heart.  Fluid around the heart.  Signs of weakness or disease in the muscles, valves, and tissues of the heart. Tell a health care provider about:  Any allergies you have. This is especially important if you have had a previous allergic reaction to contrast dye.  All medicines you are taking, including vitamins, herbs, eye drops, creams, and over-the-counter medicines.  Any blood disorders you have.  Any surgeries you have had.  Any medical conditions you have.  Whether you are pregnant or may be pregnant.  Any anxiety disorders, chronic pain, or other conditions you have that may increase your stress or prevent you from lying still. What are the risks? Generally, this is a safe procedure. However, problems may occur, including: 1. Bleeding. 2. Infection. 3. Allergic reactions to  medicines or dyes. 4. Damage to other structures or organs. 5. Kidney damage from the contrast dye that is used. 6. Increased risk of cancer from radiation exposure. This risk is low. Talk with your health care provider about: ? The risks and benefits of testing. ? How you can receive the lowest dose of radiation. What happens before the procedure? 1. Wear comfortable clothing and remove any jewelry, glasses, dentures, and hearing aids. 2. Follow instructions from your health care provider about eating and drinking. This may include: ? For 12 hours before the procedure -- avoid caffeine. This includes tea, coffee, soda, energy drinks, and diet pills. Drink plenty of water or other fluids that do not have caffeine in them. Being well hydrated can prevent complications. ? For 4-6 hours before the procedure -- stop eating and drinking. The contrast dye can cause nausea, but this is less likely if your stomach is empty. 3. Ask your health care provider about changing or stopping your regular medicines. This is especially important if you are taking diabetes medicines, blood thinners, or medicines to treat problems with erections (erectile dysfunction). What happens during the procedure?  1. Hair   on your chest may need to be removed so that small sticky patches called electrodes can be placed on your chest. These will transmit information that helps to monitor your heart during the procedure. 2. An IV will be inserted into one of your veins. 3. You might be given a medicine to control your heart rate during the procedure. This will help to ensure that good images are obtained. 4. You will be asked to lie on an exam table. This table will slide in and out of the CT machine during the procedure. 5. Contrast dye will be injected into the IV. You might feel warm, or you may get a metallic taste in your mouth. 6. You will be given a medicine called nitroglycerin. This will relax or dilate the arteries in  your heart. 7. The table that you are lying on will move into the CT machine tunnel for the scan. 8. The person running the machine will give you instructions while the scans are being done. You may be asked to: ? Keep your arms above your head. ? Hold your breath. ? Stay very still, even if the table is moving. 9. When the scanning is complete, you will be moved out of the machine. 10. The IV will be removed. The procedure may vary among health care providers and hospitals. What can I expect after the procedure? After your procedure, it is common to have:  A metallic taste in your mouth from the contrast dye.  A feeling of warmth.  A headache from the nitroglycerin. Follow these instructions at home:  Take over-the-counter and prescription medicines only as told by your health care provider.  If you are told, drink enough fluid to keep your urine pale yellow. This will help to flush the contrast dye out of your body.  Most people can return to their normal activities right after the procedure. Ask your health care provider what activities are safe for you.  It is up to you to get the results of your procedure. Ask your health care provider, or the department that is doing the procedure, when your results will be ready.  Keep all follow-up visits as told by your health care provider. This is important. Contact a health care provider if: 1. You have any symptoms of allergy to the contrast dye. These include: ? Shortness of breath. ? Rash or hives. ? A racing heartbeat. Summary  A cardiac CT angiogram is a procedure to look at the heart and the area around the heart. It may be done to help find the cause of chest pains or other symptoms of heart disease.  During this procedure, a large X-ray machine, called a CT scanner, takes detailed pictures of the heart and the surrounding area after a contrast dye has been injected into blood vessels in the area.  Ask your health care  provider about changing or stopping your regular medicines before the procedure. This is especially important if you are taking diabetes medicines, blood thinners, or medicines to treat erectile dysfunction.  If you are told, drink enough fluid to keep your urine pale yellow. This will help to flush the contrast dye out of your body. This information is not intended to replace advice given to you by your health care provider. Make sure you discuss any questions you have with your health care provider. Document Revised: 04/08/2019 Document Reviewed: 04/08/2019 Elsevier Patient Education  [place].

## 2020-08-17 NOTE — Progress Notes (Signed)
CK and anti-CCP are normal.

## 2020-08-17 NOTE — Progress Notes (Signed)
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 provider what activities are safe for you.  It is up to you to get the results of your procedure. Ask your health care provider, or the department that is doing the procedure, when your results will be ready.  Keep all follow-up visits as told by your health care provider. This is important. Contact a health care provider if: 1. You have any symptoms of allergy to the contrast dye. These include: ? Shortness of breath. ? Rash or hives. ? A racing heartbeat. Summary  A cardiac CT angiogram is a procedure to look at the heart and the area around the heart. It may be done to help find the cause of chest pains or other symptoms of heart disease.  During this procedure, a large X-ray machine, called a CT scanner, takes detailed pictures of the heart and the surrounding area after a contrast dye has been injected into blood vessels in the area.  Ask your health care provider about changing or stopping your regular medicines before the procedure. This is  especially important if you are taking diabetes medicines, blood thinners, or medicines to treat erectile dysfunction.  If you are told, drink enough fluid to keep your urine pale yellow. This will help to flush the contrast dye out of your body. This information is not intended to replace advice given to you by your health care provider. Make sure you discuss any questions you have with your health care provider. Document Revised: 04/08/2019 Document Reviewed: 04/08/2019 Elsevier Patient Education  2020 Elsevier Inc.      Adopting a Healthy Lifestyle.  Know what a healthy weight is for you (roughly BMI <25) and aim to maintain this   Aim for 7+ servings of fruits and vegetables daily   65-80+ fluid ounces of water or unsweet tea for healthy kidneys   Limit to max 1 drink of alcohol per day; avoid smoking/tobacco   Limit animal fats in diet for cholesterol and heart health - choose grass fed whenever available   Avoid highly processed foods, and foods high in saturated/trans fats   Aim for low stress - take time to unwind and care for your mental health   Aim for 150 min of moderate intensity exercise weekly for heart health, and weights twice weekly for bone health   Aim for 7-9 hours of sleep daily   When it comes to diets, agreement about the perfect plan isnt easy to find, even among the experts. Experts at the [employer] developed an idea known as the Healthy Eating Plate. Just imagine a plate divided into logical, healthy portions.   The emphasis is on diet quality:   Load up on vegetables and fruits - one-half of your plate: Aim for color and variety, and remember that potatoes dont   count.   Go for whole grains - one-quarter of your plate: Whole wheat, barley, wheat berries, quinoa, oats, brown rice, and foods made with them. If you want pasta, go with whole wheat pasta.   Protein power - one-quarter of your plate: Fish, chicken, beans, and nuts  are all healthy, versatile protein sources. Limit red meat.   The diet, however, does go beyond the plate, offering a few other suggestions.   Use healthy plant oils, such as olive, canola, soy, corn, sunflower and peanut. Check the labels, and avoid partially hydrogenated oil, which have unhealthy trans fats.   If youre thirsty, drink water. Coffee and tea are good in moderation, but skip sugary drinks and limit milk and dairy products to one or two daily servings.   The type of carbohydrate in the diet is more important than the amount. Some sources of carbohydrates, such as vegetables, fruits, whole grains, and beans-are healthier than others.   Finally, stay active  Signed, Berniece Salines, DO  08/18/2020 8:13 AM    [place]

## 2020-08-18 ENCOUNTER — Telehealth: Payer: Self-pay | Admitting: Cardiology

## 2020-08-18 ENCOUNTER — Other Ambulatory Visit: Payer: Self-pay | Admitting: Cardiology

## 2020-08-18 DIAGNOSIS — E119 Type 2 diabetes mellitus without complications: Secondary | ICD-10-CM | POA: Insufficient documentation

## 2020-08-18 DIAGNOSIS — R0789 Other chest pain: Secondary | ICD-10-CM | POA: Insufficient documentation

## 2020-08-18 DIAGNOSIS — I10 Essential (primary) hypertension: Secondary | ICD-10-CM | POA: Insufficient documentation

## 2020-08-18 DIAGNOSIS — E782 Mixed hyperlipidemia: Secondary | ICD-10-CM | POA: Insufficient documentation

## 2020-08-18 DIAGNOSIS — Z794 Long term (current) use of insulin: Secondary | ICD-10-CM | POA: Insufficient documentation

## 2020-08-18 NOTE — Telephone Encounter (Signed)
Patient states she did not have lab work done yesterday, but it states she did on her after visit summary. She states she does not want to be charged for lab work, when she did not have it done.

## 2020-08-18 NOTE — Telephone Encounter (Signed)
Patient left without instructions being explained due to being in a hurry. Reviewed instructions with pt via phone. Pt verbalized understanding and had no additional questions.

## 2020-08-22 ENCOUNTER — Telehealth: Payer: Self-pay | Admitting: Family Medicine

## 2020-08-22 NOTE — Telephone Encounter (Signed)
Patient states her son recently tested positive for covid 47, and she is exerpericing symptoms abd pain, vomiting, patient was tested and rapid test came back negative. Patient is still waiting for her other covid test to come back. Patient would like to get set up for antibody injection.  Please Advise

## 2020-08-23 ENCOUNTER — Other Ambulatory Visit: Payer: Self-pay | Admitting: Family Medicine

## 2020-08-23 DIAGNOSIS — R079 Chest pain, unspecified: Secondary | ICD-10-CM

## 2020-08-23 DIAGNOSIS — N951 Menopausal and female climacteric states: Secondary | ICD-10-CM

## 2020-08-23 NOTE — Telephone Encounter (Signed)
Tried calling patient-no answer. Left detailed message. Patient would need to test positive for covid in order to receive infusion. I do not have documentation if patient is vaccinated or not.

## 2020-09-12 ENCOUNTER — Encounter: Payer: Self-pay | Admitting: Family Medicine

## 2020-09-13 ENCOUNTER — Other Ambulatory Visit: Payer: Self-pay | Admitting: Internal Medicine

## 2020-09-13 NOTE — Telephone Encounter (Signed)
Pt has been scheduled.  °

## 2020-09-15 ENCOUNTER — Ambulatory Visit: Payer: 59 | Admitting: Rheumatology

## 2020-09-16 ENCOUNTER — Ambulatory Visit: Payer: 59 | Admitting: Internal Medicine

## 2020-09-16 ENCOUNTER — Other Ambulatory Visit: Payer: Self-pay

## 2020-09-16 ENCOUNTER — Encounter: Payer: Self-pay | Admitting: Family Medicine

## 2020-09-16 ENCOUNTER — Ambulatory Visit (INDEPENDENT_AMBULATORY_CARE_PROVIDER_SITE_OTHER): Payer: 59 | Admitting: Family Medicine

## 2020-09-16 VITALS — BP 140/80 | HR 81 | Temp 98.3°F | Resp 20 | Ht 70.0 in | Wt 276.6 lb

## 2020-09-16 DIAGNOSIS — D691 Qualitative platelet defects: Secondary | ICD-10-CM

## 2020-09-16 DIAGNOSIS — J4520 Mild intermittent asthma, uncomplicated: Secondary | ICD-10-CM | POA: Diagnosis not present

## 2020-09-16 DIAGNOSIS — I10 Essential (primary) hypertension: Secondary | ICD-10-CM

## 2020-09-16 DIAGNOSIS — Z23 Encounter for immunization: Secondary | ICD-10-CM

## 2020-09-16 LAB — CBC WITH DIFFERENTIAL/PLATELET
Basophils Absolute: 0 10*3/uL (ref 0.0–0.1)
Basophils Relative: 0.7 % (ref 0.0–3.0)
Eosinophils Absolute: 0.1 10*3/uL (ref 0.0–0.7)
Eosinophils Relative: 3 % (ref 0.0–5.0)
HCT: 40.9 % (ref 36.0–46.0)
Hemoglobin: 13.8 g/dL (ref 12.0–15.0)
Lymphocytes Relative: 37.5 % (ref 12.0–46.0)
Lymphs Abs: 1.4 10*3/uL (ref 0.7–4.0)
MCHC: 33.6 g/dL (ref 30.0–36.0)
MCV: 81.8 fl (ref 78.0–100.0)
Monocytes Absolute: 0.3 10*3/uL (ref 0.1–1.0)
Monocytes Relative: 6.9 % (ref 3.0–12.0)
Neutro Abs: 1.9 10*3/uL (ref 1.4–7.7)
Neutrophils Relative %: 51.9 % (ref 43.0–77.0)
Platelets: 110 10*3/uL — ABNORMAL LOW (ref 150.0–400.0)
RBC: 5 Mil/uL (ref 3.87–5.11)
RDW: 16 % — ABNORMAL HIGH (ref 11.5–15.5)
WBC: 3.6 10*3/uL — ABNORMAL LOW (ref 4.0–10.5)

## 2020-09-16 MED ORDER — ALBUTEROL SULFATE (2.5 MG/3ML) 0.083% IN NEBU: 2.5000 mg | INHALATION_SOLUTION | Freq: Four times a day (QID) | RESPIRATORY_TRACT | 1 refills | Status: DC | PRN

## 2020-09-16 NOTE — Patient Instructions (Signed)
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
## 2020-09-17 DIAGNOSIS — D691 Qualitative platelet defects: Secondary | ICD-10-CM | POA: Insufficient documentation

## 2020-09-17 DIAGNOSIS — J4520 Mild intermittent asthma, uncomplicated: Secondary | ICD-10-CM | POA: Insufficient documentation

## 2020-09-17 NOTE — Assessment & Plan Note (Signed)
Stable Refill inhaler

## 2020-09-17 NOTE — Assessment & Plan Note (Addendum)
Well controlled, no changes to meds. Encouraged heart healthy diet such as the DASH diet and exercise as tolerated.  °

## 2020-09-17 NOTE — Assessment & Plan Note (Signed)
Recheck today. 

## 2020-10-02 ENCOUNTER — Other Ambulatory Visit: Payer: Self-pay | Admitting: Internal Medicine

## 2020-10-02 DIAGNOSIS — E1142 Type 2 diabetes mellitus with diabetic polyneuropathy: Secondary | ICD-10-CM

## 2020-10-04 ENCOUNTER — Telehealth: Payer: Self-pay

## 2020-10-04 NOTE — Telephone Encounter (Signed)
S/w pt and r/s her appt from 4/29 to 4/28 as dr pe on call     Kimberly [name]

## 2020-10-08 LAB — BASIC METABOLIC PANEL
BUN/Creatinine Ratio: 18 (ref 12–28)
BUN: 12 mg/dL (ref 8–27)
CO2: 24 mmol/L (ref 20–29)
Calcium: 10.1 mg/dL (ref 8.7–10.3)
Chloride: 98 mmol/L (ref 96–106)
Creatinine, Ser: 0.68 mg/dL (ref 0.57–1.00)
GFR calc Af Amer: 110 mL/min/{1.73_m2} (ref >59)
GFR calc non Af Amer: 95 mL/min/{1.73_m2} (ref >59)
Glucose: 134 mg/dL — ABNORMAL HIGH (ref 65–99)
Potassium: 3.8 mmol/L (ref 3.5–5.2)
Sodium: 138 mmol/L (ref 134–144)

## 2020-10-11 ENCOUNTER — Encounter (HOSPITAL_COMMUNITY): Payer: Self-pay

## 2020-10-11 ENCOUNTER — Telehealth (HOSPITAL_COMMUNITY): Payer: Self-pay | Admitting: *Deleted

## 2020-10-11 NOTE — Telephone Encounter (Signed)
Reaching out to patient to offer assistance regarding upcoming cardiac imaging study; pt verbalizes understanding of appt date/time, parking situation and where to check in, pre-test NPO status and medications ordered, and verified current allergies; name and call back number provided for further questions should they arise  Gordy Clement RN Navigator [facility] [phone] office [phone] cell  Pt requests instructions to be sent to her MyChart.

## 2020-10-13 ENCOUNTER — Telehealth: Payer: Self-pay | Admitting: Cardiology

## 2020-10-13 ENCOUNTER — Ambulatory Visit (HOSPITAL_COMMUNITY)
Admission: RE | Admit: 2020-10-13 | Discharge: 2020-10-13 | Disposition: A | Discharge status: Home / Self Care | Payer: 59 | Source: Ambulatory Visit | Attending: Cardiology | Admitting: Cardiology

## 2020-10-13 ENCOUNTER — Other Ambulatory Visit: Payer: Self-pay

## 2020-10-13 DIAGNOSIS — R072 Precordial pain: Secondary | ICD-10-CM | POA: Insufficient documentation

## 2020-10-13 DIAGNOSIS — Z006 Encounter for examination for normal comparison and control in clinical research program: Secondary | ICD-10-CM

## 2020-10-13 MED ORDER — METOPROLOL TARTRATE 100 MG PO TABS: 100.0000 mg | ORAL_TABLET | Freq: Once | ORAL | 0 refills | Status: DC

## 2020-10-13 MED ORDER — NITROGLYCERIN 0.4 MG SL SUBL
0.8000 mg | SUBLINGUAL_TABLET | Freq: Once | SUBLINGUAL | Status: AC
  Administered 2020-10-13: 0.8 mg via SUBLINGUAL

## 2020-10-13 MED ORDER — NITROGLYCERIN 0.4 MG SL SUBL
SUBLINGUAL_TABLET | SUBLINGUAL | Status: AC
  Filled 2020-10-13: qty 2

## 2020-10-13 MED ORDER — METOPROLOL TARTRATE 5 MG/5ML IV SOLN
INTRAVENOUS | Status: AC
  Administered 2020-10-13: 5 mg via INTRAVENOUS
  Filled 2020-10-13: qty 5

## 2020-10-13 MED ORDER — DILTIAZEM HCL 25 MG/5ML IV SOLN
10.0000 mg | Freq: Once | INTRAVENOUS | Status: AC
  Filled 2020-10-13: qty 5

## 2020-10-13 MED ORDER — DILTIAZEM HCL 25 MG/5ML IV SOLN
INTRAVENOUS | Status: AC
  Administered 2020-10-13: 10 mg via INTRAVENOUS
  Filled 2020-10-13: qty 5

## 2020-10-13 MED ORDER — IOHEXOL 350 MG/ML SOLN
80.0000 mL | Freq: Once | INTRAVENOUS | Status: AC | PRN
  Administered 2020-10-13: 80 mL via INTRAVENOUS

## 2020-10-13 MED ORDER — METOPROLOL TARTRATE 5 MG/5ML IV SOLN: 5.0000 mg | INTRAVENOUS | Status: DC | PRN

## 2020-10-13 NOTE — Telephone Encounter (Signed)
*  STAT* If patient is at the pharmacy, call can be transferred to refill team.   1. Which medications need to be refilled? (please list name of each medication and dose if known)  metoprolol tartrate (LOPRESSOR) 100 MG tablet  2. Which pharmacy/location (including street and city if local pharmacy) is medication to be sent to? [facility] [phone] - [place], [place]  3. Do they need a 30 day or 90 day supply? One pill  Patient needs to take this medication before her CT today and she can not find it anywhere in her house.

## 2020-10-13 NOTE — Research (Signed)
IDENTIFY Informed Consent                  Subject Name: Kimberly [name]. Kimberly [name]    Subject met inclusion and exclusion criteria.  The informed consent form, study requirements and expectations were reviewed with the subject and questions and concerns were addressed prior to the signing of the consent form.  The subject verbalized understanding of the trial requirements.  The subject agreed to participate in the IDENTIFY trial and signed the informed consent at 15:26PM on 10/13/20.  The informed consent was obtained prior to performance of any protocol-specific procedures for the subject.  A copy of the signed informed consent was given to the subject and a copy was placed in the subject's medical record.   Meade Maw, Naval architect

## 2020-10-13 NOTE — Telephone Encounter (Signed)
Prescription was already called in this morning per request and patient was notified via [place]

## 2020-10-14 ENCOUNTER — Other Ambulatory Visit: Payer: Self-pay | Admitting: Cardiology

## 2020-10-14 ENCOUNTER — Telehealth: Payer: Self-pay | Admitting: Medical

## 2020-10-14 NOTE — Telephone Encounter (Signed)
    Patient called the after hours line to report concerns for lower heart rate and nose bleed. 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 She wonders what the additional medication was and thinks it started with a C. Suggested corlanor which is commonly used, though she wasn't sure that sounded familiar. Nonetheless she has been feeling sluggish and dizzy since then. No vision changes, lightheadedness, syncope, chest pain, or change in her chronic SOB. HR has been in the 60s today and recent BP was 140s/70s which she states is her baseline. She also had a nose bleed this evening which is unusual. This stopped after a few minutes with pressure and tissues. She was concerned something she received yesterday caused her to bleed. Reassured her this was unlikely. Ultimately recommended watchful waiting. ED precautions reviewed. Patient was in agreement with the plan and appreciative of the call.   Abigail Butts, PA-C 10/14/20; 7:44 PM

## 2020-11-02 ENCOUNTER — Other Ambulatory Visit: Payer: Self-pay | Admitting: Internal Medicine

## 2020-11-02 DIAGNOSIS — E1165 Type 2 diabetes mellitus with hyperglycemia: Secondary | ICD-10-CM

## 2020-11-02 DIAGNOSIS — E1142 Type 2 diabetes mellitus with diabetic polyneuropathy: Secondary | ICD-10-CM

## 2020-11-17 ENCOUNTER — Other Ambulatory Visit: Payer: Self-pay | Admitting: Internal Medicine

## 2020-12-03 ENCOUNTER — Ambulatory Visit
Admission: EM | Admit: 2020-12-03 | Discharge: 2020-12-03 | Disposition: A | Discharge status: Home / Self Care | Payer: 59 | Attending: Family Medicine | Admitting: Family Medicine

## 2020-12-03 ENCOUNTER — Other Ambulatory Visit: Payer: Self-pay

## 2020-12-03 DIAGNOSIS — D696 Thrombocytopenia, unspecified: Secondary | ICD-10-CM

## 2020-12-03 DIAGNOSIS — R234 Changes in skin texture: Secondary | ICD-10-CM

## 2020-12-03 NOTE — ED Triage Notes (Signed)
Pt presents with c/o hardened area under skin on left thigh that developed a few days ago, area is not painful but pt has had achy ness

## 2020-12-03 NOTE — Discharge Instructions (Addendum)
You have had labs (blood work) drawn today. We will call you with any significant abnormalities or if there is need to begin or change treatment or pursue further follow up.  You may also review your test results online through [facility]. If you do not have a MyChart account, instructions to sign up should be on your discharge paperwork.

## 2020-12-04 LAB — CBC
Hematocrit: 44.4 % (ref 34.0–46.6)
Hemoglobin: 14.5 g/dL (ref 11.1–15.9)
MCH: 27.6 pg (ref 26.6–33.0)
MCHC: 32.7 g/dL (ref 31.5–35.7)
MCV: 84 fL (ref 79–97)
Platelets: 122 10*3/uL — ABNORMAL LOW (ref 150–450)
RBC: 5.26 x10E6/uL (ref 3.77–5.28)
RDW: 14.7 % (ref 11.7–15.4)
WBC: 3.9 10*3/uL (ref 3.4–10.8)

## 2020-12-05 ENCOUNTER — Telehealth: Payer: Self-pay
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
## 2020-12-05 NOTE — Telephone Encounter (Signed)
PT CALLED IN TO R/S HER April APPT TO AUG AS SHE WENT TO THE URGENT CARE [facility] NOT[facility] MATTER BUT HAD HER CBC CHECKED AND HER PLTS WERE UP SO SHE REQ TO MOVE HER APPT OUT    Kimberly [name]

## 2020-12-21 ENCOUNTER — Encounter: Payer: Self-pay | Admitting: Family Medicine

## 2020-12-22 ENCOUNTER — Other Ambulatory Visit: Payer: Self-pay | Admitting: Family Medicine

## 2020-12-22 ENCOUNTER — Other Ambulatory Visit: Payer: 59

## 2020-12-22 ENCOUNTER — Ambulatory Visit: Payer: 59 | Admitting: Hematology & Oncology

## 2020-12-22 MED ORDER — HYDRALAZINE HCL 10 MG PO TABS: 10.0000 mg | ORAL_TABLET | Freq: Three times a day (TID) | ORAL | 0 refills | Status: DC

## 2020-12-23 ENCOUNTER — Other Ambulatory Visit: Payer: 59

## 2020-12-23 ENCOUNTER — Ambulatory Visit: Payer: 59 | Admitting: Hematology & Oncology

## 2020-12-24 ENCOUNTER — Other Ambulatory Visit: Payer: Self-pay | Admitting: Family Medicine

## 2020-12-24 DIAGNOSIS — K219 Gastro-esophageal reflux disease without esophagitis: Secondary | ICD-10-CM

## 2020-12-30 ENCOUNTER — Other Ambulatory Visit: Payer: Self-pay

## 2020-12-30 ENCOUNTER — Ambulatory Visit (INDEPENDENT_AMBULATORY_CARE_PROVIDER_SITE_OTHER): Payer: 59 | Admitting: Internal Medicine

## 2020-12-30 ENCOUNTER — Encounter: Payer: Self-pay | Admitting: Internal Medicine

## 2020-12-30 VITALS — BP 150/90 | HR 87 | Ht 70.0 in | Wt 278.4 lb

## 2020-12-30 DIAGNOSIS — E7849 Other hyperlipidemia: Secondary | ICD-10-CM | POA: Diagnosis not present

## 2020-12-30 DIAGNOSIS — E1165 Type 2 diabetes mellitus with hyperglycemia: Secondary | ICD-10-CM | POA: Diagnosis not present

## 2020-12-30 DIAGNOSIS — E1142 Type 2 diabetes mellitus with diabetic polyneuropathy: Secondary | ICD-10-CM | POA: Diagnosis not present

## 2020-12-30 DIAGNOSIS — E669 Obesity, unspecified: Secondary | ICD-10-CM | POA: Diagnosis not present

## 2020-12-30 LAB — POCT GLYCOSYLATED HEMOGLOBIN (HGB A1C): Hemoglobin A1C: 8.2 % — AB (ref 4.0–5.6)

## 2020-12-30 NOTE — Patient Instructions (Addendum)
Please continue: - Metformin 1000 mg 2x a day with meals  - Lantus 50 units 2x a day (last dose at bedtime)  Please increase: - NovoLog 20-30 units 15 min before meals  Try not to correct high blood sugars at bedtime.  Please return in 3 months.
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
## 2021-01-02 NOTE — Telephone Encounter (Signed)
It looks likes hydrALAZINE was called in instead of hyrdoxyzine. Please confirm correct medication and sig please

## 2021-01-03 ENCOUNTER — Other Ambulatory Visit: Payer: Self-pay | Admitting: Family Medicine

## 2021-01-03 MED ORDER — HYDROXYZINE HCL 10 MG PO TABS: 10.0000 mg | ORAL_TABLET | Freq: Three times a day (TID) | ORAL | 1 refills | Status: DC | PRN

## 2021-01-06 ENCOUNTER — Other Ambulatory Visit: Payer: Self-pay | Admitting: Internal Medicine

## 2021-01-19 ENCOUNTER — Other Ambulatory Visit: Payer: Self-pay | Admitting: Family Medicine

## 2021-01-24 ENCOUNTER — Telehealth: Payer: Self-pay | Admitting: Internal Medicine

## 2021-01-24 NOTE — Telephone Encounter (Signed)
New messag     1. Which medications need to be refilled? (please list name of each medication and dose if known)   LANTUS SOLOSTAR 100 UNIT/ML Solostar Pen  insulin aspart (NOVOLOG FLEXPEN) 100 UNIT/ML FlexPen  2. Which pharmacy/location (including street and city if local pharmacy) is medication to be sent to? [facility] [place], [place] AT [facility]

## 2021-01-26 ENCOUNTER — Other Ambulatory Visit: Payer: Self-pay

## 2021-01-26 DIAGNOSIS — E1142 Type 2 diabetes mellitus with diabetic polyneuropathy: Secondary | ICD-10-CM

## 2021-01-26 DIAGNOSIS — E1165 Type 2 diabetes mellitus with hyperglycemia: Secondary | ICD-10-CM

## 2021-01-26 MED ORDER — LANTUS SOLOSTAR 100 UNIT/ML ~~LOC~~ SOPN: PEN_INJECTOR | SUBCUTANEOUS | 1 refills | Status: DC

## 2021-01-26 NOTE — Telephone Encounter (Signed)
Rx sent 

## 2021-01-31 ENCOUNTER — Telehealth: Payer: Self-pay

## 2021-01-31 DIAGNOSIS — Z006 Encounter for examination for normal comparison and control in clinical research program: Secondary | ICD-10-CM

## 2021-01-31 NOTE — Telephone Encounter (Signed)
I called pt for her 90 day Identify Study follow up phone call. Pt stated she was busy with a customer and would call back later. I left my name and my contact number with pt.

## 2021-02-09 ENCOUNTER — Encounter: Payer: Self-pay | Admitting: Family Medicine

## 2021-02-09 LAB — HM DIABETES EYE EXAM

## 2021-02-13 ENCOUNTER — Other Ambulatory Visit: Payer: Self-pay | Admitting: Internal Medicine

## 2021-02-13 DIAGNOSIS — E1142 Type 2 diabetes mellitus with diabetic polyneuropathy: Secondary | ICD-10-CM

## 2021-02-13 DIAGNOSIS — E1165 Type 2 diabetes mellitus with hyperglycemia: Secondary | ICD-10-CM

## 2021-02-14 ENCOUNTER — Telehealth: Payer: Self-pay | Admitting: Family Medicine

## 2021-02-14 NOTE — Telephone Encounter (Signed)
Patient called and requested that the medication be sent to the [facility] on [address].  - She is requesting refill of Novolog and Metformin

## 2021-02-14 NOTE — Telephone Encounter (Signed)
Medication last filled in 2020. Pt seen in 01/22. Please advise okay to refill or needs OV

## 2021-02-14 NOTE — Telephone Encounter (Signed)
Medication: buPROPion (WELLBUTRIN XL) 150 MG 24 hr tablet [[id number]]     Has the patient contacted their pharmacy?  no (If no, request that the patient contact the pharmacy for the refill.) (If yes, when and what did the pharmacy advise?)    Preferred Pharmacy (with phone number or street name): [facility], [place] - [place] AT [address] Phone:  [phone]  Fax:  [phone]        Agent: Please be advised that RX refills may take up to 3 business days. We ask that you follow-up with your pharmacy.

## 2021-02-15 NOTE — Telephone Encounter (Signed)
Pt called. Left VM to confirm medication.

## 2021-02-16 ENCOUNTER — Other Ambulatory Visit: Payer: Self-pay | Admitting: Internal Medicine

## 2021-02-16 DIAGNOSIS — E1142 Type 2 diabetes mellitus with diabetic polyneuropathy: Secondary | ICD-10-CM

## 2021-02-16 DIAGNOSIS — E1165 Type 2 diabetes mellitus with hyperglycemia: Secondary | ICD-10-CM

## 2021-02-17 ENCOUNTER — Other Ambulatory Visit: Payer: Self-pay

## 2021-02-17 MED ORDER — METFORMIN HCL ER 500 MG PO TB24: 1000.0000 mg | ORAL_TABLET | Freq: Two times a day (BID) | ORAL | 0 refills | Status: DC

## 2021-02-17 MED ORDER — NOVOLOG FLEXPEN 100 UNIT/ML ~~LOC~~ SOPN: 18.0000 [IU] | PEN_INJECTOR | Freq: Three times a day (TID) | SUBCUTANEOUS | 0 refills | Status: DC

## 2021-02-17 NOTE — Telephone Encounter (Signed)
Patient called X2 to requested that the medication be sent to the [facility] on [address].  - She is requesting refill of Novolog and Metformin

## 2021-02-25 ENCOUNTER — Encounter: Payer: Self-pay | Admitting: Family Medicine

## 2021-02-28 ENCOUNTER — Other Ambulatory Visit: Payer: Self-pay | Admitting: Family Medicine

## 2021-02-28 DIAGNOSIS — F419 Anxiety disorder, unspecified: Secondary | ICD-10-CM

## 2021-02-28 MED ORDER — LORAZEPAM 0.5 MG PO TABS: 0.5000 mg | ORAL_TABLET | Freq: Two times a day (BID) | ORAL | 1 refills | Status: DC | PRN

## 2021-02-28 NOTE — Telephone Encounter (Signed)
Would you like a office visit?

## 2021-03-08 ENCOUNTER — Other Ambulatory Visit: Payer: Self-pay | Admitting: Internal Medicine

## 2021-03-08 ENCOUNTER — Other Ambulatory Visit: Payer: Self-pay | Admitting: Family Medicine

## 2021-03-31 ENCOUNTER — Ambulatory Visit: Payer: 59 | Admitting: Hematology & Oncology

## 2021-03-31 ENCOUNTER — Other Ambulatory Visit: Payer: 59

## 2021-04-07 ENCOUNTER — Other Ambulatory Visit: Payer: Self-pay | Admitting: Internal Medicine

## 2021-04-07 DIAGNOSIS — E1142 Type 2 diabetes mellitus with diabetic polyneuropathy: Secondary | ICD-10-CM

## 2021-04-19 ENCOUNTER — Encounter: Payer: Self-pay | Admitting: Family Medicine

## 2021-05-02 ENCOUNTER — Encounter: Payer: Self-pay | Admitting: Internal Medicine

## 2021-05-05 ENCOUNTER — Ambulatory Visit: Payer: 59 | Admitting: Internal Medicine

## 2021-05-17 ENCOUNTER — Other Ambulatory Visit: Payer: Self-pay | Admitting: Family Medicine

## 2021-05-17 DIAGNOSIS — F419 Anxiety disorder, unspecified: Secondary | ICD-10-CM

## 2021-05-17 NOTE — Telephone Encounter (Signed)
Requesting: Ativan Contract: 2019 [id number] Last OV: 09/16/20 Next OV: N/A Last Refill: 02/28/21, #60--1 RF Database:   Please advise

## 2021-05-18 ENCOUNTER — Other Ambulatory Visit: Payer: Self-pay | Admitting: Internal Medicine

## 2021-05-18 ENCOUNTER — Telehealth: Payer: Self-pay | Admitting: Internal Medicine

## 2021-05-18 DIAGNOSIS — E1142 Type 2 diabetes mellitus with diabetic polyneuropathy: Secondary | ICD-10-CM

## 2021-05-18 MED ORDER — FREESTYLE LIBRE 2 SENSOR MISC: 0 refills | Status: DC

## 2021-05-18 NOTE — Telephone Encounter (Signed)
PT has a upcoming app on 08/18/2021 refill request   Continuous Blood Gluc Sensor (FREESTYLE LIBRE 2 SENSOR) MISC   [id number] - [place], [place] - [address]

## 2021-05-18 NOTE — Telephone Encounter (Signed)
1 month supply sent to pharmacy

## 2021-05-18 NOTE — Addendum Note (Signed)
Addended by: Cinda Quest on: 05/18/2021 07:28 PM   Modules accepted: Orders

## 2021-05-24 ENCOUNTER — Encounter: Payer: Self-pay | Admitting: Family Medicine

## 2021-05-25 ENCOUNTER — Other Ambulatory Visit: Payer: Self-pay | Admitting: Internal Medicine

## 2021-05-25 ENCOUNTER — Other Ambulatory Visit: Payer: Self-pay | Admitting: Family Medicine

## 2021-05-25 DIAGNOSIS — R110 Nausea: Secondary | ICD-10-CM

## 2021-05-25 MED ORDER — ONDANSETRON 4 MG PO TBDP
ORAL_TABLET | ORAL | 2 refills | Status: DC
Start: 2021-05-25 — End: 2021-05-29

## 2021-05-25 MED ORDER — BUPROPION HCL ER (XL) 150 MG PO TB24: ORAL_TABLET | ORAL | 0 refills | Status: DC

## 2021-05-25 NOTE — Progress Notes (Addendum)
Patient called the after-hours MD, bupropion was sent but not received by the pharmacy.  Prescription resent electronically. JP   Addendum, on call nurse called again, my prescription did not go through.  Asked nurse to give v.o. to the pharmacy  JP

## 2021-05-29 ENCOUNTER — Other Ambulatory Visit: Payer: Self-pay

## 2021-05-29 ENCOUNTER — Other Ambulatory Visit: Payer: Self-pay | Admitting: Internal Medicine

## 2021-05-29 DIAGNOSIS — E1142 Type 2 diabetes mellitus with diabetic polyneuropathy: Secondary | ICD-10-CM

## 2021-05-29 DIAGNOSIS — R110 Nausea: Secondary | ICD-10-CM

## 2021-05-29 MED ORDER — ONDANSETRON 4 MG PO TBDP: ORAL_TABLET | ORAL | 0 refills | Status: DC

## 2021-05-29 MED ORDER — BUPROPION HCL ER (XL) 150 MG PO TB24: ORAL_TABLET | ORAL | 0 refills | Status: DC

## 2021-06-13 ENCOUNTER — Other Ambulatory Visit: Payer: Self-pay | Admitting: Surgery

## 2021-06-13 DIAGNOSIS — R911 Solitary pulmonary nodule: Secondary | ICD-10-CM

## 2021-06-16 ENCOUNTER — Other Ambulatory Visit: Payer: Self-pay | Admitting: Internal Medicine

## 2021-06-16 DIAGNOSIS — E1165 Type 2 diabetes mellitus with hyperglycemia: Secondary | ICD-10-CM

## 2021-06-16 DIAGNOSIS — E1142 Type 2 diabetes mellitus with diabetic polyneuropathy: Secondary | ICD-10-CM

## 2021-06-30 ENCOUNTER — Other Ambulatory Visit: Payer: Self-pay | Admitting: Family Medicine

## 2021-06-30 DIAGNOSIS — F419 Anxiety disorder, unspecified: Secondary | ICD-10-CM

## 2021-06-30 NOTE — Telephone Encounter (Signed)
Requesting: lorazepam 0.74m Contract: 2019 UDS: 2019 Last Visit: 09/16/2020 Next Visit: None Last Refill: 05/17/2021 #60 x 0RF  Please Advise

## 2021-07-04 ENCOUNTER — Telehealth: Payer: Self-pay | Admitting: Family Medicine

## 2021-07-04 ENCOUNTER — Encounter: Payer: Self-pay | Admitting: Internal Medicine

## 2021-07-04 DIAGNOSIS — E1165 Type 2 diabetes mellitus with hyperglycemia: Secondary | ICD-10-CM

## 2021-07-04 DIAGNOSIS — E1142 Type 2 diabetes mellitus with diabetic polyneuropathy: Secondary | ICD-10-CM

## 2021-07-04 DIAGNOSIS — I10 Essential (primary) hypertension: Secondary | ICD-10-CM

## 2021-07-04 MED ORDER — HYDROCHLOROTHIAZIDE 25 MG PO TABS: 25.0000 mg | ORAL_TABLET | Freq: Every day | ORAL | 1 refills | Status: DC

## 2021-07-04 NOTE — Telephone Encounter (Signed)
Refill sent.

## 2021-07-04 NOTE — Telephone Encounter (Signed)
Medication: hydrochlorothiazide (HYDRODIURIL) 25 MG tablet   Has the patient contacted their pharmacy? Yes.   (If no, request that the patient contact the pharmacy for the refill.) (If yes, when and what did the pharmacy advise?)  Preferred Pharmacy (with phone number or street name): [facility], [place] - [place] AT [facility]  [address] [place] [address]  Phone:  [phone]  Fax:  [phone]   Agent: Please be advised that RX refills may take up to 3 business days. We ask that you follow-up with your pharmacy.

## 2021-07-05 ENCOUNTER — Other Ambulatory Visit: Payer: 59

## 2021-07-05 MED ORDER — ACCU-CHEK GUIDE VI STRP: ORAL_STRIP | 0 refills | Status: DC

## 2021-07-12 ENCOUNTER — Other Ambulatory Visit: Payer: Self-pay | Admitting: Internal Medicine

## 2021-07-12 DIAGNOSIS — E1142 Type 2 diabetes mellitus with diabetic polyneuropathy: Secondary | ICD-10-CM

## 2021-07-12 DIAGNOSIS — E1165 Type 2 diabetes mellitus with hyperglycemia: Secondary | ICD-10-CM

## 2021-07-13 ENCOUNTER — Encounter: Payer: Self-pay | Admitting: Internal Medicine

## 2021-07-13 DIAGNOSIS — E1142 Type 2 diabetes mellitus with diabetic polyneuropathy: Secondary | ICD-10-CM

## 2021-07-14 MED ORDER — LANTUS SOLOSTAR 100 UNIT/ML ~~LOC~~ SOPN: PEN_INJECTOR | SUBCUTANEOUS | 0 refills | Status: DC

## 2021-07-17 ENCOUNTER — Ambulatory Visit
Admission: RE | Admit: 2021-07-17 | Discharge: 2021-07-17 | Disposition: A | Discharge status: Home / Self Care | Payer: 59 | Source: Ambulatory Visit | Attending: Surgery | Admitting: Surgery

## 2021-07-17 DIAGNOSIS — R911 Solitary pulmonary nodule: Secondary | ICD-10-CM

## 2021-07-19 ENCOUNTER — Encounter: Payer: Self-pay | Admitting: Surgery

## 2021-07-19 ENCOUNTER — Ambulatory Visit: Payer: 59 | Admitting: Surgery

## 2021-07-19 ENCOUNTER — Other Ambulatory Visit: Payer: Self-pay

## 2021-07-19 VITALS — BP 130/66 | HR 68 | Resp 20 | Wt 279.0 lb

## 2021-07-19 DIAGNOSIS — R918 Other nonspecific abnormal finding of lung field: Secondary | ICD-10-CM | POA: Diagnosis not present

## 2021-07-19 NOTE — Progress Notes (Signed)
HPI:  The patient returns today for routine surveillance followup status post left thoracotomy and wedge resection of a left lower lobe carcinoid tumor on 10/26/2011. This was a well-differentiated carcinoid with negative resection margins. She also had a 4 mm RUL nodule that had been seen on a previous CT but was not seen on subsequent CT of the chest.  She reports having COVID twice over the last couple years with long COVID symptoms.  She continues to work from home.  She has had some chronic chest pains and shortness of breath.  Current Outpatient Medications  Medication Sig Dispense Refill   ACCU-CHEK FASTCLIX LANCETS MISC   0   albuterol (PROVENTIL) (2.5 MG/3ML) 0.083% nebulizer solution Take 3 mLs (2.5 mg total) by nebulization every 6 (six) hours as needed for wheezing or shortness of breath. 150 mL 1   buPROPion (WELLBUTRIN XL) 150 MG 24 hr tablet TAKE 1 TABLET(150 MG) BY MOUTH EVERY EVENING.  Need ov before any more refills. 90 tablet 0   Continuous Blood Gluc Sensor (FREESTYLE LIBRE 2 SENSOR) MISC INJECT INTO SKIN EVERY 14 DAYS 2 each 0   diclofenac Sodium (VOLTAREN) 1 % GEL Apply 2 g topically 4 (four) times daily. 100 g 3   glucose blood (ACCU-CHEK GUIDE) test strip Use as instructed 4 times a day. 50 each 0   hydrochlorothiazide (HYDRODIURIL) 25 MG tablet Take 1 tablet (25 mg total) by mouth daily. 90 tablet 1   hydrOXYzine (ATARAX/VISTARIL) 10 MG tablet TAKE 1 TABLET(10 MG) BY MOUTH THREE TIMES DAILY AS NEEDED 30 tablet 1   LANTUS SOLOSTAR 100 UNIT/ML Solostar Pen ADMINISTER 50 UNITS UNDER THE SKIN IN THE MORNING AND AT BEDTIME 45 mL 0   LORazepam (ATIVAN) 0.5 MG tablet TAKE 1 TABLET(0.5 MG) BY MOUTH TWICE DAILY AS NEEDED FOR ANXIETY 60 tablet 1   metFORMIN (GLUCOPHAGE-XR) 500 MG 24 hr tablet TAKE 2 TABLETS(1000 MG) BY MOUTH IN THE MORNING AND AT BEDTIME 240 tablet 0   Multiple Vitamin (MULTIVITAMIN PO) Take by mouth daily.     NOVOLOG FLEXPEN 100 UNIT/ML FlexPen INJECT 18-25  UNITS INTO SKIN WITH BREAKFAST, LUNCH AND EVENING MEAL 45 mL 0   nystatin ointment (MYCOSTATIN) APPLY TOPICALLY TO THE AFFECTED AREA TWICE DAILY 30 g 0   ondansetron (ZOFRAN ODT) 4 MG disintegrating tablet 1-2 po q8 hours prn nausea/vomit 40 tablet 0   pantoprazole (PROTONIX) 40 MG tablet TAKE 1 TABLET(40 MG) BY MOUTH TWICE DAILY 180 tablet 2   rosuvastatin (CRESTOR) 5 MG tablet Take 1 tablet (5 mg total) by mouth daily. 90 tablet 4   sucralfate (CARAFATE) 1 g tablet Take 1 g by mouth 2 (two) times daily.     triamcinolone ointment (KENALOG) 0.1 % APPLY TOPICALLY TO THE AFFECTED AREA TWICE DAILY 30 g 0   VITAMIN D PO Take 2,000 Units by mouth daily.     nitroGLYCERIN (NITROSTAT) 0.4 MG SL tablet Place 1 tablet (0.4 mg total) under the tongue every 5 (five) minutes as needed. 25 tablet 6   No current facility-administered medications for this visit.    Physical Exam:  BP 130/66 (BP Location: Left Arm, Patient Position: Sitting)   Pulse 68   Resp 20   Wt 126.6 kg   SpO2 95% Comment: RA  BMI 40.03 kg/m  She looks well. There is no cervical or supraclavicular adenopathy. Lungs are clear. Cardiac exam shows a regular rate and rhythm with normal heart sounds.  Diagnostic Tests:  Narrative &  Impression  CLINICAL DATA:  Lung nodule.  COVID in February 2021.  Cough.   EXAM: CT CHEST WITHOUT CONTRAST   TECHNIQUE: Multidetector CT imaging of the chest was performed following the standard protocol without IV contrast.   COMPARISON:  10/13/2020, 02/25/2020, 10/19/2019 and 03/05/2018.   FINDINGS: Cardiovascular: Heart size normal.  No pericardial effusion.   Mediastinum/Nodes: Subcentimeter low-attenuation lesion in the right thyroid. No follow-up recommended. (Ref: J Am Coll Radiol. 2015 Feb;12(2): 143-50).No pathologically enlarged mediastinal or axillary lymph nodes. Hilar regions are difficult to evaluate without IV contrast but appear grossly unremarkable. Esophagus is grossly  unremarkable.   Lungs/Pleura: Postoperative scarring in the left lower lobe. Scattered peripheral ground-glass, similar to 02/25/2020. Lungs are otherwise clear. No pleural fluid. Airway is unremarkable.   Upper Abdomen: Liver margin is irregular. Visualized portions of the liver and adrenal glands are otherwise unremarkable. Spleen appears enlarged but is incompletely imaged.   Musculoskeletal: Degenerative changes in the spine. No worrisome lytic or sclerotic lesions.   IMPRESSION: 1. Scattered areas of subpleural ground-glass, similar to the prior exam and possibly postinfectious/postinflammatory in etiology, including due to prior COVID-19 pneumonia. 2. Cirrhosis.  Suspect associated splenomegaly.     Electronically Signed   By: Lorin Picket M.D.   On: 07/17/2021 13:18      Impression:  CT scan of the chest shows scattered areas of subpleural groundglass that is felt to be postinfectious/postinflammatory and may be related to her previous Covid infections.  There is postoperative scarring in the left lower lobe that is unchanged.  There is no pleural fluid.  There is no evidence of recurrent carcinoid.  I reviewed the CT images with her and answered all the questions.  She is almost 10 years out from her resection.  I think we can wait 2 years to repeat her CT scan of the chest.  Plan:  She will return to see me in 2 years with a CT scan of the chest.  I spent 20 minutes performing this established patient evaluation and > 50% of this time was spent face to face counseling and coordinating the surveillance of her previously resected carcinoid lung tumor.   Gaye Pollack, MD [facility] [phone]

## 2021-08-01 ENCOUNTER — Other Ambulatory Visit: Payer: Self-pay | Admitting: Internal Medicine

## 2021-08-01 DIAGNOSIS — E1165 Type 2 diabetes mellitus with hyperglycemia: Secondary | ICD-10-CM

## 2021-08-01 DIAGNOSIS — E1142 Type 2 diabetes mellitus with diabetic polyneuropathy: Secondary | ICD-10-CM

## 2021-08-02 ENCOUNTER — Other Ambulatory Visit: Payer: Self-pay

## 2021-08-02 DIAGNOSIS — E1142 Type 2 diabetes mellitus with diabetic polyneuropathy: Secondary | ICD-10-CM

## 2021-08-02 MED ORDER — FREESTYLE LIBRE 2 SENSOR MISC: 3 refills | Status: DC

## 2021-08-15 ENCOUNTER — Encounter: Payer: Self-pay | Admitting: Internal Medicine

## 2021-08-15 ENCOUNTER — Encounter: Payer: Self-pay | Admitting: Family Medicine

## 2021-08-15 DIAGNOSIS — J4520 Mild intermittent asthma, uncomplicated: Secondary | ICD-10-CM

## 2021-08-16 MED ORDER — FREESTYLE PRECISION NEO TEST VI STRP: ORAL_STRIP | 0 refills | Status: DC

## 2021-08-18 ENCOUNTER — Ambulatory Visit: Payer: 59 | Admitting: Internal Medicine

## 2021-08-18 MED ORDER — NONFORMULARY OR COMPOUNDED ITEM: 0 refills | Status: AC

## 2021-08-18 NOTE — Telephone Encounter (Signed)
Rx sent 

## 2021-08-21 ENCOUNTER — Other Ambulatory Visit: Payer: Self-pay | Admitting: Family Medicine

## 2021-08-21 DIAGNOSIS — I10 Essential (primary) hypertension: Secondary | ICD-10-CM

## 2021-08-22 ENCOUNTER — Telehealth: Payer: Self-pay | Admitting: Internal Medicine

## 2021-08-22 NOTE — Telephone Encounter (Signed)
Pharmacy contacted and advised rx sent in for the Precision Neo test strips are compatible with the [facility] 2 reader. Message sent to pt via Mychart earlier explaining test strips sent in are compatible. See previous encounter.

## 2021-08-22 NOTE — Telephone Encounter (Signed)
PT called concerning incorrect test strip prescription sent to pharmacy. PT currently is using the [place] 2 and needs the correct test strips for this sensor.  Pharmacy is: [id number] [address], [place] [place] Phone:  [phone]  Fax:  [phone]

## 2021-08-25 ENCOUNTER — Ambulatory Visit: Payer: 59 | Admitting: Internal Medicine

## 2021-09-08 ENCOUNTER — Other Ambulatory Visit: Payer: Self-pay

## 2021-09-08 DIAGNOSIS — F419 Anxiety disorder, unspecified: Secondary | ICD-10-CM

## 2021-09-08 MED ORDER — LORAZEPAM 0.5 MG PO TABS: ORAL_TABLET | ORAL | 1 refills | Status: DC

## 2021-09-08 NOTE — Telephone Encounter (Signed)
Requesting: Lorazepam 0.55m Contract: 2019 UDS: 2019 Last Visit:  09/16/2020 Next Visit: None Last Refill: 07/10/2021 #60 x 1 RF  Please Advise

## 2021-09-10 ENCOUNTER — Other Ambulatory Visit: Payer: Self-pay | Admitting: Internal Medicine

## 2021-09-10 DIAGNOSIS — E1142 Type 2 diabetes mellitus with diabetic polyneuropathy: Secondary | ICD-10-CM

## 2021-09-15 NOTE — Telephone Encounter (Signed)
Patient states he never heard back about the [employer] mini nebulizer and the pharmacy does not have it. She would like for it to be sent to:  [facility]  [place], [place] [address]  Phone:  [phone]  Fax:  [phone]

## 2021-09-19 ENCOUNTER — Other Ambulatory Visit: Payer: Self-pay | Admitting: Internal Medicine

## 2021-09-19 DIAGNOSIS — E1142 Type 2 diabetes mellitus with diabetic polyneuropathy: Secondary | ICD-10-CM

## 2021-09-19 MED ORDER — INSULIN GLARGINE-YFGN 100 UNIT/ML ~~LOC~~ SOPN: PEN_INJECTOR | SUBCUTANEOUS | 3 refills | Status: DC

## 2021-09-19 MED ORDER — NOVOLOG FLEXPEN 100 UNIT/ML ~~LOC~~ SOPN: PEN_INJECTOR | SUBCUTANEOUS | 3 refills | Status: DC

## 2021-09-20 ENCOUNTER — Telehealth: Payer: Self-pay | Admitting: Internal Medicine

## 2021-09-20 DIAGNOSIS — E1142 Type 2 diabetes mellitus with diabetic polyneuropathy: Secondary | ICD-10-CM

## 2021-09-20 NOTE — Telephone Encounter (Signed)
Pt is calling in stating that she is not able to afford the insulin NOVOLOG FLEXPEN 100 UNIT that was called in on yesterday due to her new insurance ([employer]) but she is able to afford the [place] (45 days worth at [facility]) and to see if it is compatible to the [place].  [employer] and instrumentation engineer Rx.  Pt would like to have a call back

## 2021-09-21 MED ORDER — NOVOLIN R FLEXPEN 100 UNIT/ML IJ SOPN: 20.0000 [IU] | PEN_INJECTOR | Freq: Three times a day (TID) | INTRAMUSCULAR | 5 refills | Status: DC

## 2021-09-21 NOTE — Telephone Encounter (Signed)
Rx sent to preferred pharmacy.

## 2021-09-21 NOTE — Telephone Encounter (Signed)
T, I would suggest Novolin only if absolutely needed, since this has a longer duration in blood and needs to be injected 30 minutes before meal, rather than 15 minutes before the meal, as for NovoLog.  Does she know whether Fiasp, Humalog, or Lyumjev are covered?  I would prefer to use these.  If we have no other option, okay to go ahead with Novolin R.

## 2021-09-21 NOTE — Telephone Encounter (Signed)
Yes, Novolin R 20-30 units 30 min before meals

## 2021-10-13 ENCOUNTER — Other Ambulatory Visit: Payer: Self-pay

## 2021-10-13 ENCOUNTER — Encounter: Payer: Self-pay | Admitting: Internal Medicine

## 2021-10-13 ENCOUNTER — Ambulatory Visit: Payer: 59 | Admitting: Internal Medicine

## 2021-10-13 VITALS — BP 128/82 | HR 98 | Ht 70.0 in | Wt 278.0 lb

## 2021-10-13 DIAGNOSIS — E7849 Other hyperlipidemia: Secondary | ICD-10-CM | POA: Diagnosis not present

## 2021-10-13 DIAGNOSIS — E1165 Type 2 diabetes mellitus with hyperglycemia: Secondary | ICD-10-CM | POA: Diagnosis not present

## 2021-10-13 DIAGNOSIS — E1142 Type 2 diabetes mellitus with diabetic polyneuropathy: Secondary | ICD-10-CM | POA: Diagnosis not present

## 2021-10-13 LAB — POCT GLYCOSYLATED HEMOGLOBIN (HGB A1C): Hemoglobin A1C: 7.8 % — AB (ref 4.0–5.6)

## 2021-10-13 LAB — MICROALBUMIN / CREATININE URINE RATIO
Creatinine,U: 69.9 mg/dL
Microalb Creat Ratio: 1 mg/g (ref 0.0–30.0)
Microalb, Ur: <0.7 mg/dL (ref 0.0–1.9)

## 2021-10-13 LAB — COMPREHENSIVE METABOLIC PANEL
ALT: 25 U/L (ref 0–35)
AST: 34 U/L (ref 0–37)
Albumin: 4.3 g/dL (ref 3.5–5.2)
Alkaline Phosphatase: 128 U/L — ABNORMAL HIGH (ref 39–117)
BUN: 9 mg/dL (ref 6–23)
CO2: 31 mEq/L (ref 19–32)
Calcium: 9.6 mg/dL (ref 8.4–10.5)
Chloride: 99 mEq/L (ref 96–112)
Creatinine, Ser: 0.79 mg/dL (ref 0.40–1.20)
GFR: 80.59 mL/min (ref >60.00)
Glucose, Bld: 206 mg/dL — ABNORMAL HIGH (ref 70–99)
Potassium: 3.9 mEq/L (ref 3.5–5.1)
Sodium: 137 mEq/L (ref 135–145)
Total Bilirubin: 0.8 mg/dL (ref 0.2–1.2)
Total Protein: 7.2 g/dL (ref 6.0–8.3)

## 2021-10-13 LAB — LIPID PANEL
Cholesterol: 215 mg/dL — ABNORMAL HIGH (ref 0–200)
HDL: 62.4 mg/dL (ref >39.00)
NonHDL: 152.6
Total CHOL/HDL Ratio: 3
Triglycerides: 214 mg/dL — ABNORMAL HIGH (ref 0.0–149.0)
VLDL: 42.8 mg/dL — ABNORMAL HIGH (ref 0.0–40.0)

## 2021-10-13 LAB — LDL CHOLESTEROL, DIRECT: Direct LDL: 118 mg/dL

## 2021-10-13 MED ORDER — DEXCOM G6 TRANSMITTER MISC: 1.0000 | 3 refills | Status: DC

## 2021-10-13 MED ORDER — DEXCOM G6 SENSOR MISC: 1.0000 | 3 refills | Status: AC

## 2021-10-13 MED ORDER — DEXCOM G6 RECEIVER DEVI: 1.0000 | Freq: Once | 0 refills | Status: AC

## 2021-10-13 NOTE — Patient Instructions (Addendum)
Please continue: - Metformin 1000 mg 2x a day with meals  - Semglee 50 units in am and 50 units at night  Please increase: - Novolin R 20-30 units 30 min before meals   Try not to correct high blood sugars at bedtime, but if >300, inject <10 units of R insulin.   Please return in 3 months.
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
## 2021-11-17 ENCOUNTER — Other Ambulatory Visit: Payer: Self-pay | Admitting: Family Medicine

## 2021-11-17 DIAGNOSIS — F419 Anxiety disorder, unspecified: Secondary | ICD-10-CM

## 2021-11-20 ENCOUNTER — Other Ambulatory Visit: Payer: Self-pay | Admitting: Family Medicine

## 2021-11-20 DIAGNOSIS — F419 Anxiety disorder, unspecified: Secondary | ICD-10-CM

## 2021-11-20 MED ORDER — LORAZEPAM 0.5 MG PO TABS: ORAL_TABLET | ORAL | 1 refills | Status: DC

## 2021-11-21 ENCOUNTER — Ambulatory Visit (INDEPENDENT_AMBULATORY_CARE_PROVIDER_SITE_OTHER): Payer: 59 | Admitting: Family Medicine

## 2021-11-21 ENCOUNTER — Encounter: Payer: Self-pay | Admitting: Family Medicine

## 2021-11-21 VITALS — BP 132/67 | HR 74 | Temp 97.8°F | Resp 12 | Ht 70.0 in | Wt 279.0 lb

## 2021-11-21 DIAGNOSIS — K219 Gastro-esophageal reflux disease without esophagitis: Secondary | ICD-10-CM

## 2021-11-21 DIAGNOSIS — E7800 Pure hypercholesterolemia, unspecified: Secondary | ICD-10-CM

## 2021-11-21 DIAGNOSIS — E1165 Type 2 diabetes mellitus with hyperglycemia: Secondary | ICD-10-CM

## 2021-11-21 DIAGNOSIS — E785 Hyperlipidemia, unspecified: Secondary | ICD-10-CM | POA: Diagnosis not present

## 2021-11-21 DIAGNOSIS — R079 Chest pain, unspecified: Secondary | ICD-10-CM | POA: Diagnosis not present

## 2021-11-21 DIAGNOSIS — J4520 Mild intermittent asthma, uncomplicated: Secondary | ICD-10-CM

## 2021-11-21 DIAGNOSIS — I10 Essential (primary) hypertension: Secondary | ICD-10-CM

## 2021-11-21 DIAGNOSIS — E1169 Type 2 diabetes mellitus with other specified complication: Secondary | ICD-10-CM | POA: Diagnosis not present

## 2021-11-21 DIAGNOSIS — N951 Menopausal and female climacteric states: Secondary | ICD-10-CM

## 2021-11-21 MED ORDER — FLUTICASONE-SALMETEROL 100-50 MCG/ACT IN AEPB
1.0000 | INHALATION_SPRAY | Freq: Two times a day (BID) | RESPIRATORY_TRACT | 3 refills | Status: DC
Start: 2021-11-21 — End: 2023-09-25

## 2021-11-21 MED ORDER — BUPROPION HCL ER (XL) 150 MG PO TB24: ORAL_TABLET | ORAL | 1 refills | Status: DC

## 2021-11-21 MED ORDER — PANTOPRAZOLE SODIUM 40 MG PO TBEC: DELAYED_RELEASE_TABLET | ORAL | 2 refills | Status: DC

## 2021-11-21 MED ORDER — ALBUTEROL SULFATE (2.5 MG/3ML) 0.083% IN NEBU: 2.5000 mg | INHALATION_SOLUTION | Freq: Four times a day (QID) | RESPIRATORY_TRACT | 1 refills | Status: DC | PRN

## 2021-11-21 MED ORDER — NYSTATIN 100000 UNIT/GM EX OINT: TOPICAL_OINTMENT | CUTANEOUS | 0 refills | Status: DC

## 2021-11-21 MED ORDER — TRIAMCINOLONE ACETONIDE 0.1 % EX OINT: TOPICAL_OINTMENT | CUTANEOUS | 0 refills | Status: DC
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
## 2021-11-21 NOTE — Patient Instructions (Signed)
Cholesterol Content in Foods ?Cholesterol is a waxy, fat-like substance that helps to carry fat in the blood. The body needs cholesterol in small amounts, but too much cholesterol can cause damage to the arteries and heart. ?What foods have cholesterol? ?Cholesterol is found in animal-based foods, such as meat, seafood, and dairy. Generally, low-fat dairy and lean meats have less cholesterol than full-fat dairy and fatty meats. The milligrams of cholesterol per serving (mg per serving) of common cholesterol-containing foods are listed below. ?Meats and other proteins ?Egg -- one large whole egg has 186 mg. ?Veal shank -- 4 oz (113 g) has 141 mg. ?Lean ground [place] (93% lean) -- 4 oz (113 g) has 118 mg. ?Fat-trimmed lamb loin -- 4 oz (113 g) has 106 mg. ?Lean ground beef (90% lean) -- 4 oz (113 g) has 100 mg. ?Lobster -- 3.5 oz (99 g) has 90 mg. ?Pork loin chops -- 4 oz (113 g) has 86 mg. ?Canned salmon -- 3.5 oz (99 g) has 83 mg. ?Fat-trimmed beef top loin -- 4 oz (113 g) has 78 mg. ?Frankfurter -- 1 frank (3.5 oz or 99 g) has 77 mg. ?Crab -- 3.5 oz (99 g) has 71 mg. ?Roasted chicken without skin, white meat -- 4 oz (113 g) has 66 mg. ?Light bologna -- 2 oz (57 g) has 45 mg. ?Deli-cut [place] -- 2 oz (57 g) has 31 mg. ?Canned tuna -- 3.5 oz (99 g) has 31 mg. ?Berniece Salines -- 1 oz (28 g) has 29 mg. ?Oysters and mussels (raw) -- 3.5 oz (99 g) has 25 mg. ?Mackerel -- 1 oz (28 g) has 22 mg. ?Trout -- 1 oz (28 g) has 20 mg. ?Pork sausage -- 1 link (1 oz or 28 g) has 17 mg. ?Salmon -- 1 oz (28 g) has 16 mg. ?Tilapia -- 1 oz (28 g) has 14 mg. ?Dairy ?Soft-serve ice cream -- ? cup (4 oz or 86 g) has 103 mg. ?Whole-milk yogurt -- 1 cup (8 oz or 245 g) has 29 mg. ?Cheddar cheese -- 1 oz (28 g) has 28 mg. ?American cheese -- 1 oz (28 g) has 28 mg. ?Whole milk -- 1 cup (8 oz or 250 mL) has 23 mg. ?2% milk -- 1 cup (8 oz or 250 mL) has 18 mg. ?Cream cheese -- 1 tablespoon (Tbsp) (14.5 g) has 15 mg. ?Cottage cheese -- ? cup (4 oz or 113  g) has 14 mg. ?Low-fat (1%) milk -- 1 cup (8 oz or 250 mL) has 10 mg. ?Sour cream -- 1 Tbsp (12 g) has 8.5 mg. ?Low-fat yogurt -- 1 cup (8 oz or 245 g) has 8 mg. ?Nonfat Greek yogurt -- 1 cup (8 oz or 228 g) has 7 mg. ?Half-and-half cream -- 1 Tbsp (15 mL) has 5 mg. ?Fats and oils ?Cod liver oil -- 1 tablespoon (Tbsp) (13.6 g) has 82 mg. ?Butter -- 1 Tbsp (14 g) has 15 mg. ?Lard -- 1 Tbsp (12.8 g) has 14 mg. ?Bacon grease -- 1 Tbsp (12.9 g) has 14 mg. ?Mayonnaise -- 1 Tbsp (13.8 g) has 5-10 mg. ?Margarine -- 1 Tbsp (14 g) has 3-10 mg. ?The items listed above may not be a complete list of foods with cholesterol. Exact amounts of cholesterol in these foods may vary depending on specific ingredients and brands. Contact a dietitian for more information. ?What foods do not have cholesterol? ?Most plant-based foods do not have cholesterol unless you combine them with a food that has cholesterol.  Foods without cholesterol include: ?Grains and cereals. ?Vegetables. ?Fruits. ?Vegetable oils, such as olive, canola, and sunflower oil. ?Legumes, such as peas, beans, and lentils. ?Nuts and seeds. ?Egg whites. ?The items listed above may not be a complete list of foods that do not have cholesterol. Contact a dietitian for more information. ?Summary ?The body needs cholesterol in small amounts, but too much cholesterol can cause damage to the arteries and heart. ?Cholesterol is found in animal-based foods, such as meat, seafood, and dairy. Generally, low-fat dairy and lean meats have less cholesterol than full-fat dairy and fatty meats. ?This information is not intended to replace advice given to you by your health care provider. Make sure you discuss any questions you have with your health care provider. ?Document Revised: 12/23/2020 Document Reviewed: 12/23/2020 ?Elsevier Patient Education ? [place]. ? ?

## 2021-11-22 LAB — CBC WITH DIFFERENTIAL/PLATELET
Basophils Absolute: 0.1 10*3/uL (ref 0.0–0.1)
Basophils Relative: 1.2 % (ref 0.0–3.0)
Eosinophils Absolute: 0.1 10*3/uL (ref 0.0–0.7)
Eosinophils Relative: 3.3 % (ref 0.0–5.0)
HCT: 40.5 % (ref 36.0–46.0)
Hemoglobin: 13.4 g/dL (ref 12.0–15.0)
Lymphocytes Relative: 38.1 % (ref 12.0–46.0)
Lymphs Abs: 1.6 10*3/uL (ref 0.7–4.0)
MCHC: 33.1 g/dL (ref 30.0–36.0)
MCV: 85.3 fl (ref 78.0–100.0)
Monocytes Absolute: 0.3 10*3/uL (ref 0.1–1.0)
Monocytes Relative: 7 % (ref 3.0–12.0)
Neutro Abs: 2.1 10*3/uL (ref 1.4–7.7)
Neutrophils Relative %: 50.4 % (ref 43.0–77.0)
Platelets: 134 10*3/uL — ABNORMAL LOW (ref 150.0–400.0)
RBC: 4.75 Mil/uL (ref 3.87–5.11)
RDW: 14.8 % (ref 11.5–15.5)
WBC: 4.2 10*3/uL (ref 4.0–10.5)

## 2021-11-22 LAB — LIPID PANEL
Cholesterol: 224 mg/dL — ABNORMAL HIGH (ref 0–200)
HDL: 60.8 mg/dL (ref >39.00)
LDL Cholesterol: 125 mg/dL — ABNORMAL HIGH (ref 0–99)
NonHDL: 163.69
Total CHOL/HDL Ratio: 4
Triglycerides: 194 mg/dL — ABNORMAL HIGH (ref 0.0–149.0)
VLDL: 38.8 mg/dL (ref 0.0–40.0)

## 2021-11-22 LAB — COMPREHENSIVE METABOLIC PANEL
ALT: 27 U/L (ref 0–35)
AST: 31 U/L (ref 0–37)
Albumin: 4.4 g/dL (ref 3.5–5.2)
Alkaline Phosphatase: 126 U/L — ABNORMAL HIGH (ref 39–117)
BUN: 10 mg/dL (ref 6–23)
CO2: 29 mEq/L (ref 19–32)
Calcium: 9.6 mg/dL (ref 8.4–10.5)
Chloride: 101 mEq/L (ref 96–112)
Creatinine, Ser: 0.75 mg/dL (ref 0.40–1.20)
GFR: 85.7 mL/min (ref >60.00)
Glucose, Bld: 108 mg/dL — ABNORMAL HIGH (ref 70–99)
Potassium: 3.8 mEq/L (ref 3.5–5.1)
Sodium: 139 mEq/L (ref 135–145)
Total Bilirubin: 0.7 mg/dL (ref 0.2–1.2)
Total Protein: 7.7 g/dL (ref 6.0–8.3)

## 2021-11-22 LAB — HEMOGLOBIN A1C: Hgb A1c MFr Bld: 8 % — ABNORMAL HIGH (ref 4.6–6.5)

## 2021-11-24 NOTE — Assessment & Plan Note (Signed)
Well controlled, no changes to meds. Encouraged heart healthy diet such as the DASH diet and exercise as tolerated.  °

## 2021-11-24 NOTE — Assessment & Plan Note (Signed)
Encourage heart healthy diet such as MIND or DASH diet, increase exercise, avoid trans fats, simple carbohydrates and processed foods, consider a krill or fish or flaxseed oil cap daily.  °

## 2021-11-24 NOTE — Assessment & Plan Note (Signed)
Lab Results  ?Component Value Date  ? HGBA1C 8.0 (H) 11/21/2021  ?per endo  ?

## 2021-11-26 ENCOUNTER — Other Ambulatory Visit: Payer: Self-pay | Admitting: Family Medicine

## 2021-11-26 DIAGNOSIS — E1165 Type 2 diabetes mellitus with hyperglycemia: Secondary | ICD-10-CM

## 2021-12-03 ENCOUNTER — Other Ambulatory Visit: Payer: Self-pay | Admitting: Internal Medicine

## 2021-12-03 ENCOUNTER — Other Ambulatory Visit: Payer: Self-pay | Admitting: Family Medicine

## 2021-12-03 DIAGNOSIS — E1142 Type 2 diabetes mellitus with diabetic polyneuropathy: Secondary | ICD-10-CM

## 2021-12-03 DIAGNOSIS — E1169 Type 2 diabetes mellitus with other specified complication: Secondary | ICD-10-CM

## 2021-12-03 DIAGNOSIS — E1165 Type 2 diabetes mellitus with hyperglycemia: Secondary | ICD-10-CM

## 2021-12-07 MED ORDER — EZETIMIBE 10 MG PO TABS: 10.0000 mg | ORAL_TABLET | Freq: Every day | ORAL | 2 refills | Status: DC

## 2021-12-07 NOTE — Addendum Note (Signed)
Addended by: Kittie Plater, Norene Oliveri HUA on: 12/07/2021 01:04 PM ? ? Modules accepted: Orders ? ?

## 2021-12-12 ENCOUNTER — Encounter: Payer: Self-pay | Admitting: Family Medicine

## 2021-12-12 ENCOUNTER — Other Ambulatory Visit: Payer: Self-pay | Admitting: Family Medicine

## 2021-12-13 ENCOUNTER — Other Ambulatory Visit: Payer: Self-pay | Admitting: Family Medicine

## 2021-12-13 DIAGNOSIS — F419 Anxiety disorder, unspecified: Secondary | ICD-10-CM

## 2021-12-13 MED ORDER — LORAZEPAM 0.5 MG PO TABS: ORAL_TABLET | ORAL | 1 refills | Status: DC

## 2021-12-20 ENCOUNTER — Encounter: Payer: Self-pay | Admitting: Family Medicine

## 2021-12-20 ENCOUNTER — Telehealth: Payer: Self-pay | Admitting: Family Medicine

## 2021-12-20 DIAGNOSIS — R110 Nausea: Secondary | ICD-10-CM

## 2021-12-20 MED ORDER — ONDANSETRON 4 MG PO TBDP: ORAL_TABLET | ORAL | 0 refills | Status: DC

## 2021-12-20 NOTE — Telephone Encounter (Signed)
Noted  
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
## 2021-12-21 ENCOUNTER — Encounter: Payer: Self-pay | Admitting: Family Medicine

## 2021-12-21 ENCOUNTER — Ambulatory Visit (INDEPENDENT_AMBULATORY_CARE_PROVIDER_SITE_OTHER): Payer: 59 | Admitting: Family Medicine

## 2021-12-21 VITALS — BP 130/82 | HR 75 | Temp 97.8°F | Resp 18 | Ht 70.0 in | Wt 279.4 lb

## 2021-12-21 DIAGNOSIS — R300 Dysuria: Secondary | ICD-10-CM | POA: Diagnosis not present

## 2021-12-21 DIAGNOSIS — S0990XA Unspecified injury of head, initial encounter: Secondary | ICD-10-CM

## 2021-12-21 LAB — POC URINALSYSI DIPSTICK (AUTOMATED)
Blood, UA: NEGATIVE
Glucose, UA: NEGATIVE
Nitrite, UA: NEGATIVE
Protein, UA: NEGATIVE
Spec Grav, UA: 1.02 (ref 1.010–1.025)
Urobilinogen, UA: 0.2 E.U./dL
pH, UA: 6 (ref 5.0–8.0)
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
## 2021-12-21 NOTE — Assessment & Plan Note (Signed)
+   concussion ?No relief from headache-- zofran helps nausea ?Ct scan stat  ?Go to er if symptoms worsen  ?

## 2021-12-21 NOTE — Patient Instructions (Signed)
Concussion, Adult ? ?A concussion is a brain injury from a hard, direct hit (trauma) to the head or body. This direct hit causes the brain to shake quickly back and forth inside the skull. This can damage brain cells and cause chemical changes in the brain. A concussion may also be known as a mild traumatic brain injury (TBI). ?Concussions are usually not life-threatening, but the effects of a concussion can be serious. If you have a concussion, you should be very careful to avoid having a second concussion. ?What are the causes? ?This condition is caused by: ?A direct hit to your head, such as: ?Running into another player during a game. ?Being hit in a fight. ?Hitting your head on a hard surface. ?Sudden movement of your body that causes your brain to move back and forth inside the skull, such as in a car crash. ?What are the signs or symptoms? ?The signs of a concussion can be hard to notice. Early on, they may be missed by you, family members, and health care providers. You may look fine on the outside but may act or feel differently. ?Every head injury is different. Symptoms are usually temporary but may last for days, weeks, or even months. Some symptoms appear right away, but other symptoms may not show up for hours or days. If your symptoms last longer than normal, you may have post-concussion syndrome. ?Physical symptoms ?Headaches. ?Dizziness and problems with coordination or balance. ?Sensitivity to light or noise. ?Nausea or vomiting. ?Tiredness (fatigue). ?Vision or hearing problems. ?Changes in eating or sleeping patterns. ?Seizure. ?Mental and emotional symptoms ?Irritability or mood changes. ?Memory problems. ?Trouble concentrating, organizing, or making decisions. ?Slowness in thinking, acting or reacting, speaking, or reading. ?Anxiety or depression. ?How is this diagnosed? ?This condition is diagnosed based on: ?Your symptoms. ?A description of your injury. ?You may also have tests,  including: ?Imaging tests, such as a CT scan or an MRI. ?Neuropsychological tests. These measure your thinking, understanding, learning, and remembering abilities. ?How is this treated? ?Treatment for this condition includes: ?Stopping sports or activity if you are injured. If you hit your head or show signs of concussion: ?Do not return to sports or activities the same day. ?Get checked by a health care provider before you return to your activities. ?Physical and mental rest and careful observation, usually at home. Gradually return to your normal activities. ?Medicines to help with symptoms such as headaches, nausea, or difficulty sleeping. ?Avoid taking opioid pain medicine while recovering from a concussion. ?Avoiding alcohol and drugs. These may slow your recovery and can put you at risk of further injury. ?Referral to a concussion clinic or rehabilitation center. ?Recovery from a concussion can take time. How fast you recover depends on many factors. Return to activities only when: ?Your symptoms are completely gone. ?Your health care provider says that it is safe. ?Follow these instructions at home: ?Activity ?Limit activities that require a lot of thought or concentration, such as: ?Doing homework or job-related work. ?Watching TV. ?Working on the computer or phone. ?Playing memory games and puzzles. ?Rest. Rest helps your brain heal. Make sure you: ?Get plenty of sleep. Most adults should get 7-9 hours of sleep each night. ?Rest during the day. Take naps or rest breaks when you feel tired. ?Avoid physical activity like exercise until your health care provider says it is safe. Stop any activity that worsens symptoms. ?Do not do high-risk activities that could cause a second concussion, such as riding a bike or playing  sports. ?Ask your health care provider when you can return to your normal activities, such as school, work, athletics, and driving. Your ability to react may be slower after a brain injury.  Never do these activities if you are dizzy. Your health care provider will likely give you a plan for gradually returning to activities. ?General instructions ? ?Take over-the-counter and prescription medicines only as told by your health care provider. Some medicines, such as blood thinners (anticoagulants) and aspirin, may increase the risk for complications, such as bleeding. ?Do not drink alcohol until your health care provider says you can. ?Watch your symptoms and tell others around you to do the same. Complications sometimes occur after a concussion. Older adults with a brain injury may have a higher risk of serious complications. ?Tell your work Freight forwarder, teachers, Government social research officer, school counselor, coach, or Product/process development scientist about your injury, symptoms, and restrictions. ?Keep all follow-up visits as told by your health care provider. This is important. ?How is this prevented? ?Avoiding another brain injury is very important. In rare cases, another injury can lead to permanent brain damage, brain swelling, or death. The risk of this is greatest during the first 7-10 days after a head injury. Avoid injuries by: ?Stopping activities that could lead to a second concussion, such as contact or recreational sports, until your health care provider says it is okay. ?Taking these actions once you have returned to sports or activities: ?Avoiding plays or moves that can cause you to crash into another person. This is how most concussions occur. ?Following the rules and being respectful of other players. Do not engage in violent or illegal plays. ?Getting regular exercise that includes strength and balance training. ?Wearing a properly fitting helmet during sports, biking, or other activities. Helmets can help protect you from serious skull and brain injuries, but they may not protect you from a concussion. Even when wearing a helmet, you should avoid being hit in the head. ?Contact a health care provider if: ?Your  symptoms do not improve. ?You have new symptoms. ?You have another injury. ?Get help right away if: ?You have new or worsening physical symptoms, such as: ?A severe or worsening headache. ?Weakness or numbness in any part of your body, slurred speech, vision changes, or confusion. ?Your coordination gets worse. ?Vomiting repeatedly. ?You have a seizure. ?You have unusual behavior changes. ?You lose consciousness, are sleepier than normal, or are difficult to wake up. ?These symptoms may represent a serious problem that is an emergency. Do not wait to see if the symptoms will go away. Get medical help right away. Call your local emergency services (911 in the [place] Do not drive yourself to the hospital. ?Summary ?A concussion is a brain injury that results from a hard, direct hit (trauma) to your head or body. ?You may have imaging tests and neuropsychological tests to diagnose a concussion. ?Treatment for this condition includes physical and mental rest and careful observation. ?Ask your health care provider when you can return to your normal activities, such as school, work, athletics, and driving. ?Get help right away if you have a severe headache, weakness in any part of the body, seizures, behavior changes, changes in vision, or if you are confused or sleepier than normal. ?This information is not intended to replace advice given to you by your health care provider. Make sure you discuss any questions you have with your health care provider. ?Document Revised: 10/27/2020 Document Reviewed: 10/27/2020 ?Elsevier Patient Education ? [place]. ? ?

## 2021-12-22 ENCOUNTER — Ambulatory Visit (HOSPITAL_BASED_OUTPATIENT_CLINIC_OR_DEPARTMENT_OTHER)
Admission: RE | Admit: 2021-12-22 | Discharge: 2021-12-22 | Disposition: A | Discharge status: Home / Self Care | Payer: 59 | Source: Ambulatory Visit | Attending: Family Medicine | Admitting: Family Medicine

## 2021-12-22 DIAGNOSIS — S0990XA Unspecified injury of head, initial encounter: Secondary | ICD-10-CM | POA: Diagnosis present

## 2021-12-22 LAB — URINE CULTURE
MICRO NUMBER:: 13320829
Result:: NO GROWTH
SPECIMEN QUALITY:: ADEQUATE

## 2022-01-15 ENCOUNTER — Telehealth: Payer: Self-pay

## 2022-01-15 DIAGNOSIS — K219 Gastro-esophageal reflux disease without esophagitis: Secondary | ICD-10-CM

## 2022-01-15 NOTE — Telephone Encounter (Signed)
Key: [id number]   Sent to the plan

## 2022-01-30 ENCOUNTER — Other Ambulatory Visit: Payer: Self-pay | Admitting: Internal Medicine

## 2022-01-30 ENCOUNTER — Encounter: Payer: Self-pay | Admitting: Internal Medicine

## 2022-01-30 DIAGNOSIS — E1142 Type 2 diabetes mellitus with diabetic polyneuropathy: Secondary | ICD-10-CM

## 2022-01-30 MED ORDER — METFORMIN HCL ER 500 MG PO TB24: 1000.0000 mg | ORAL_TABLET | Freq: Two times a day (BID) | ORAL | 0 refills | Status: DC

## 2022-01-31 NOTE — Telephone Encounter (Signed)
Call to check status of PA [phone]) and spoke with [facility]) and she stated that prior Josem Kaufmann is in process and we should hear something by 02/03/22.

## 2022-02-05 NOTE — Telephone Encounter (Signed)
Insurance will only cover 1 per day.

## 2022-02-05 NOTE — Telephone Encounter (Signed)
PA Case: [id number], Status: Denied, Denial Rationale: Your medication request has been denied as it does not appear to meet medically necessary requirements. Plan rules require clinical parameters (diagnosis, lab values, test results, physical exam findings etc) be met for medical necessity approval. Information submitted does not indicate required parameter results were met. Coverage for Pantoprazole Sodium 40MG Tablet Delayed Release is denied. It does not meet medical necessity. Pantoprazole Sodium 40MG Tablet Delayed Release has been requested for the treatment of GERD. The information provided by your prescriber does not meet [employer]'s guideline. The guideline used is: Quantity Limit. Information provided does not show that the policy requirements have been met. The requirement(s) not met are: Pantoprazole #60 /30 DAY supply was requested. This is more than the maximum allowed for your indication, which is #30/ 30 day supply. Therefore, coverage for Pantoprazole Sodium 40MG Tablet Delayed Release is denied. Questions? Contact [phone].

## 2022-02-07 MED ORDER — PANTOPRAZOLE SODIUM 40 MG PO TBEC: 40.0000 mg | DELAYED_RELEASE_TABLET | Freq: Every day | ORAL | 3 refills | Status: DC

## 2022-02-07 NOTE — Telephone Encounter (Signed)
Patient will try the once a day and if it does not work she will call back and then we can try an appeal.

## 2022-02-07 NOTE — Addendum Note (Signed)
Addended by: Kem Boroughs D on: 02/07/2022 11:59 AM   Modules accepted: Orders

## 2022-02-14 ENCOUNTER — Other Ambulatory Visit: Payer: Self-pay | Admitting: Family Medicine

## 2022-02-14 DIAGNOSIS — F419 Anxiety disorder, unspecified: Secondary | ICD-10-CM

## 2022-02-14 NOTE — Telephone Encounter (Signed)
Requesting: Ativan Contract: 04/10/2018 UDS: 04/10/2018 Last OV: 12/21/2021 Next OV: N/A Last Refill: 12/13/2021, #60--1 RF Database:   Please advise

## 2022-02-26 ENCOUNTER — Telehealth: Payer: Self-pay | Admitting: Family Medicine

## 2022-02-26 NOTE — Telephone Encounter (Signed)
Pt called stating that she had mailed a Handicap Placard Application to the office to be filled out about a week ago. Advised pt that we hadn't received it yet and may want to look into getting it faxed over instead. Pt would like a message sent to her when we receive the application.

## 2022-03-02 ENCOUNTER — Encounter: Payer: Self-pay | Admitting: Internal Medicine

## 2022-03-02 ENCOUNTER — Ambulatory Visit (INDEPENDENT_AMBULATORY_CARE_PROVIDER_SITE_OTHER): Payer: 59 | Admitting: Internal Medicine

## 2022-03-02 VITALS — BP 120/72 | HR 94 | Ht 70.0 in | Wt 282.4 lb

## 2022-03-02 DIAGNOSIS — E1142 Type 2 diabetes mellitus with diabetic polyneuropathy: Secondary | ICD-10-CM | POA: Diagnosis not present

## 2022-03-02 DIAGNOSIS — E7849 Other hyperlipidemia: Secondary | ICD-10-CM | POA: Diagnosis not present

## 2022-03-02 DIAGNOSIS — E1165 Type 2 diabetes mellitus with hyperglycemia: Secondary | ICD-10-CM | POA: Diagnosis not present

## 2022-03-02 LAB — POCT GLYCOSYLATED HEMOGLOBIN (HGB A1C): Hemoglobin A1C: 8 % — AB (ref 4.0–5.6)

## 2022-03-02 MED ORDER — RYBELSUS 3 MG PO TABS: 3.0000 mg | ORAL_TABLET | Freq: Every day | ORAL | 3 refills | Status: DC

## 2022-03-02 NOTE — Patient Instructions (Addendum)
Please continue: - Metformin 1000 mg 2x a day with meals  - Semglee 50 units in am and 50 units at night - Novolin R 20-30 units 30 min before meals   Try to start - Rybelsus 3 mg daily before b'fast. If you tolerate it well, in 2 weeks, increase to 2x tablets a day. Let me know if you tolerate this dose well.  Look up [facility] - Gastric Sleeve.   Please return in 3 months.
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
## 2022-03-08 ENCOUNTER — Encounter: Payer: Self-pay | Admitting: Family Medicine

## 2022-03-08 NOTE — Telephone Encounter (Signed)
DOD: Please advise

## 2022-03-13 ENCOUNTER — Encounter: Payer: Self-pay | Admitting: Family Medicine

## 2022-03-27 LAB — HM DIABETES EYE EXAM

## 2022-03-28 ENCOUNTER — Other Ambulatory Visit: Payer: Self-pay | Admitting: Family Medicine

## 2022-03-28 ENCOUNTER — Encounter: Payer: Self-pay | Admitting: Internal Medicine

## 2022-03-28 DIAGNOSIS — E1165 Type 2 diabetes mellitus with hyperglycemia: Secondary | ICD-10-CM

## 2022-03-28 DIAGNOSIS — I10 Essential (primary) hypertension: Secondary | ICD-10-CM

## 2022-03-29 ENCOUNTER — Other Ambulatory Visit (HOSPITAL_COMMUNITY): Payer: Self-pay

## 2022-03-29 ENCOUNTER — Telehealth: Payer: Self-pay

## 2022-03-29 NOTE — Telephone Encounter (Signed)
Patient Advocate Encounter   Received notification from CoverMyMeds that prior authorization is required for Rybelsus 3MG.  Submitted: 03/29/22 [place] Status is pending  Clista Bernhardt, CPhT Rx Patient Advocate Specialist Phone: [phone]

## 2022-03-30 MED ORDER — RYBELSUS 7 MG PO TABS: 7.0000 mg | ORAL_TABLET | Freq: Every day | ORAL | 1 refills | Status: DC

## 2022-03-30 MED ORDER — INSULIN GLARGINE-YFGN 100 UNIT/ML ~~LOC~~ SOPN: PEN_INJECTOR | SUBCUTANEOUS | 1 refills | Status: DC

## 2022-03-30 NOTE — Telephone Encounter (Signed)
T, Can you check with her whether she filled 30 or 90 days of Rybelsus in July?  Does she absolutely need a refill?  If she does, and the 3 mg are not covered for her yet, we can send a 7 mg tablet but she will need to cut this in half and take only half a tablet daily.

## 2022-03-30 NOTE — Addendum Note (Signed)
Addended by: Lauralyn Primes on: 03/30/2022 06:47 PM   Modules accepted: Orders

## 2022-03-30 NOTE — Telephone Encounter (Signed)
Patient Advocate Encounter  Prior authorization for Rybelsus 3MG has been DENIED. Insurance covers 30 tablets per 180 days on this plan.  3MG [id number] was filled and picked up in July. Please send in new rx for 7MG if applicable, or contact Rx Prior Auth Team if an appeal for continued 3MG use is needed.  Clista Bernhardt, CPhT Rx Patient Advocate Specialist Phone: [phone]

## 2022-03-31 ENCOUNTER — Other Ambulatory Visit: Payer: Self-pay | Admitting: Internal Medicine

## 2022-03-31 DIAGNOSIS — E1142 Type 2 diabetes mellitus with diabetic polyneuropathy: Secondary | ICD-10-CM

## 2022-04-03 ENCOUNTER — Ambulatory Visit (INDEPENDENT_AMBULATORY_CARE_PROVIDER_SITE_OTHER): Payer: 59 | Admitting: Family Medicine

## 2022-04-03 ENCOUNTER — Other Ambulatory Visit: Payer: Self-pay | Admitting: Family Medicine

## 2022-04-03 ENCOUNTER — Ambulatory Visit (HOSPITAL_BASED_OUTPATIENT_CLINIC_OR_DEPARTMENT_OTHER)
Admission: RE | Admit: 2022-04-03 | Discharge: 2022-04-03 | Disposition: A | Discharge status: Home / Self Care | Payer: 59 | Source: Ambulatory Visit | Attending: Family Medicine | Admitting: Family Medicine

## 2022-04-03 VITALS — BP 112/80 | HR 92 | Temp 98.5°F | Resp 18 | Ht 70.0 in | Wt 273.0 lb

## 2022-04-03 DIAGNOSIS — E785 Hyperlipidemia, unspecified: Secondary | ICD-10-CM | POA: Diagnosis not present

## 2022-04-03 DIAGNOSIS — R5383 Other fatigue: Secondary | ICD-10-CM

## 2022-04-03 DIAGNOSIS — F419 Anxiety disorder, unspecified: Secondary | ICD-10-CM

## 2022-04-03 DIAGNOSIS — R1011 Right upper quadrant pain: Secondary | ICD-10-CM | POA: Diagnosis present

## 2022-04-03 DIAGNOSIS — E1169 Type 2 diabetes mellitus with other specified complication: Secondary | ICD-10-CM

## 2022-04-03 DIAGNOSIS — I10 Essential (primary) hypertension: Secondary | ICD-10-CM

## 2022-04-03 DIAGNOSIS — E1165 Type 2 diabetes mellitus with hyperglycemia: Secondary | ICD-10-CM

## 2022-04-03 DIAGNOSIS — R82998 Other abnormal findings in urine: Secondary | ICD-10-CM

## 2022-04-03 DIAGNOSIS — E1142 Type 2 diabetes mellitus with diabetic polyneuropathy: Secondary | ICD-10-CM

## 2022-04-03 LAB — POC URINALSYSI DIPSTICK (AUTOMATED)
Bilirubin, UA: NEGATIVE
Blood, UA: NEGATIVE
Glucose, UA: NEGATIVE
Ketones, UA: NEGATIVE
Nitrite, UA: NEGATIVE
Protein, UA: NEGATIVE
Spec Grav, UA: 1.015 (ref 1.010–1.025)
Urobilinogen, UA: 0.2 E.U./dL
pH, UA: 6 (ref 5.0–8.0)

## 2022-04-03 MED ORDER — SUCRALFATE 1 G PO TABS: 1.0000 g | ORAL_TABLET | Freq: Two times a day (BID) | ORAL | 2 refills | Status: DC
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
## 2022-04-03 NOTE — Patient Instructions (Signed)

## 2022-04-04 LAB — COMPREHENSIVE METABOLIC PANEL
ALT: 23 U/L (ref 0–35)
AST: 26 U/L (ref 0–37)
Albumin: 4.2 g/dL (ref 3.5–5.2)
Alkaline Phosphatase: 116 U/L (ref 39–117)
BUN: 13 mg/dL (ref 6–23)
CO2: 30 mEq/L (ref 19–32)
Calcium: 9.5 mg/dL (ref 8.4–10.5)
Chloride: 98 mEq/L (ref 96–112)
Creatinine, Ser: 0.83 mg/dL (ref 0.40–1.20)
GFR: 75.7 mL/min (ref >60.00)
Glucose, Bld: 265 mg/dL — ABNORMAL HIGH (ref 70–99)
Potassium: 4 mEq/L (ref 3.5–5.1)
Sodium: 137 mEq/L (ref 135–145)
Total Bilirubin: 0.8 mg/dL (ref 0.2–1.2)
Total Protein: 7.1 g/dL (ref 6.0–8.3)

## 2022-04-04 LAB — URINE CULTURE
MICRO NUMBER:: 13750567
SPECIMEN QUALITY:: ADEQUATE

## 2022-04-04 LAB — AMYLASE: Amylase: 15 U/L — ABNORMAL LOW (ref 27–131)

## 2022-04-04 LAB — CBC WITH DIFFERENTIAL/PLATELET
Basophils Absolute: 0 10*3/uL (ref 0.0–0.1)
Basophils Relative: 0.9 % (ref 0.0–3.0)
Eosinophils Absolute: 0.1 10*3/uL (ref 0.0–0.7)
Eosinophils Relative: 3.1 % (ref 0.0–5.0)
HCT: 40.1 % (ref 36.0–46.0)
Hemoglobin: 13.5 g/dL (ref 12.0–15.0)
Lymphocytes Relative: 23.8 % (ref 12.0–46.0)
Lymphs Abs: 0.9 10*3/uL (ref 0.7–4.0)
MCHC: 33.6 g/dL (ref 30.0–36.0)
MCV: 84.8 fl (ref 78.0–100.0)
Monocytes Absolute: 0.2 10*3/uL (ref 0.1–1.0)
Monocytes Relative: 6.2 % (ref 3.0–12.0)
Neutro Abs: 2.6 10*3/uL (ref 1.4–7.7)
Neutrophils Relative %: 66 % (ref 43.0–77.0)
Platelets: 124 10*3/uL — ABNORMAL LOW (ref 150.0–400.0)
RBC: 4.73 Mil/uL (ref 3.87–5.11)
RDW: 15.5 % (ref 11.5–15.5)
WBC: 3.9 10*3/uL — ABNORMAL LOW (ref 4.0–10.5)

## 2022-04-04 LAB — LIPID PANEL
Cholesterol: 203 mg/dL — ABNORMAL HIGH (ref 0–200)
HDL: 56.6 mg/dL (ref >39.00)
LDL Cholesterol: 110 mg/dL — ABNORMAL HIGH (ref 0–99)
NonHDL: 146.67
Total CHOL/HDL Ratio: 4
Triglycerides: 185 mg/dL — ABNORMAL HIGH (ref 0.0–149.0)
VLDL: 37 mg/dL (ref 0.0–40.0)

## 2022-04-04 LAB — VITAMIN D 25 HYDROXY (VIT D DEFICIENCY, FRACTURES): VITD: 22.53 ng/mL — ABNORMAL LOW (ref 30.00–100.00)

## 2022-04-04 LAB — TSH: TSH: 0.63 u[IU]/mL (ref 0.35–5.50)

## 2022-04-04 LAB — VITAMIN B12: Vitamin B-12: 308 pg/mL (ref 211–911)

## 2022-04-04 LAB — LIPASE: Lipase: 8 U/L — ABNORMAL LOW (ref 11.0–59.0)

## 2022-04-05 ENCOUNTER — Encounter: Payer: Self-pay | Admitting: Family Medicine

## 2022-04-08 ENCOUNTER — Encounter: Payer: Self-pay | Admitting: Family Medicine

## 2022-04-08 DIAGNOSIS — R1011 Right upper quadrant pain: Secondary | ICD-10-CM | POA: Insufficient documentation

## 2022-04-08 NOTE — Assessment & Plan Note (Signed)
Per endp

## 2022-04-08 NOTE — Assessment & Plan Note (Signed)
Well controlled, no changes to meds. Encouraged heart healthy diet such as the DASH diet and exercise as tolerated.  °

## 2022-04-08 NOTE — Assessment & Plan Note (Signed)
Encourage heart healthy diet such as MIND or DASH diet, increase exercise, avoid trans fats, simple carbohydrates and processed foods, consider a krill or fish or flaxseed oil cap daily.  °

## 2022-04-08 NOTE — Assessment & Plan Note (Signed)
Check labs  Check [place] abd  If pain worsens go to ER

## 2022-04-11 ENCOUNTER — Telehealth: Payer: Self-pay | Admitting: Family Medicine

## 2022-04-11 NOTE — Telephone Encounter (Signed)
Pt called stating that she needed to look into resubmitting the referral for the nuclear scan. Someone (she's not sure if it was [place] or [facility]) was talking with her on it and said that the referral may need to be resubmitted due to it not going through.

## 2022-04-16 ENCOUNTER — Encounter (HOSPITAL_COMMUNITY): Payer: 59

## 2022-04-16 ENCOUNTER — Encounter (HOSPITAL_COMMUNITY)
Admission: RE | Admit: 2022-04-16 | Discharge: 2022-04-16 | Disposition: A | Discharge status: Home / Self Care | Payer: 59 | Source: Ambulatory Visit | Attending: Family Medicine | Admitting: Family Medicine

## 2022-04-16 DIAGNOSIS — R1011 Right upper quadrant pain: Secondary | ICD-10-CM | POA: Insufficient documentation

## 2022-04-16 MED ORDER — TECHNETIUM TC 99M MEBROFENIN IV KIT
5.4200 | PACK | Freq: Once | INTRAVENOUS | Status: AC | PRN
  Administered 2022-04-16: 5.42 via INTRAVENOUS

## 2022-04-17 ENCOUNTER — Other Ambulatory Visit: Payer: Self-pay

## 2022-04-17 DIAGNOSIS — R1011 Right upper quadrant pain: Secondary | ICD-10-CM

## 2022-04-26 ENCOUNTER — Encounter: Payer: Self-pay | Admitting: Family Medicine

## 2022-04-26 ENCOUNTER — Telehealth: Payer: Self-pay | Admitting: Family Medicine

## 2022-04-26 ENCOUNTER — Telehealth (INDEPENDENT_AMBULATORY_CARE_PROVIDER_SITE_OTHER): Payer: 59 | Admitting: Family Medicine

## 2022-04-26 DIAGNOSIS — R1032 Left lower quadrant pain: Secondary | ICD-10-CM

## 2022-04-26 DIAGNOSIS — R110 Nausea: Secondary | ICD-10-CM | POA: Diagnosis not present

## 2022-04-26 DIAGNOSIS — R197 Diarrhea, unspecified: Secondary | ICD-10-CM | POA: Diagnosis not present

## 2022-04-26 MED ORDER — METRONIDAZOLE 500 MG PO TABS: 500.0000 mg | ORAL_TABLET | Freq: Three times a day (TID) | ORAL | 0 refills | Status: AC

## 2022-04-26 MED ORDER — CIPROFLOXACIN HCL 500 MG PO TABS: 500.0000 mg | ORAL_TABLET | Freq: Two times a day (BID) | ORAL | 0 refills | Status: AC

## 2022-04-26 MED ORDER — ONDANSETRON 4 MG PO TBDP: ORAL_TABLET | ORAL | 0 refills | Status: DC

## 2022-04-26 NOTE — Telephone Encounter (Signed)
Pt needs appointment please
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
## 2022-04-26 NOTE — Telephone Encounter (Signed)
Pt scheduled for a vv today

## 2022-04-26 NOTE — Telephone Encounter (Signed)
Patient called to request an antibiotic for gastroenteritis or something similar as she believes she has a bacterial/viral infection as opposed to the GI issues that she has been tested for.  She said that she has had watery diarrhea and has been throwing up. She said she has a "sulfury taste" and gassy stomach before she throws up. She said she has pain the lower stomach and it stops once she goes to the bathroom. Patient would like prescription to be sent to :   [place] [facility] [facility] [place] [id number]  Phone:  [phone] Fax: [phone]

## 2022-04-26 NOTE — Patient Instructions (Signed)
Diarrhea, Adult Diarrhea is frequent loose and watery bowel movements. Diarrhea can make you feel weak and cause you to become dehydrated. Dehydration can make you tired and thirsty, cause you to have a dry mouth, and decrease how often you urinate. Diarrhea typically lasts 2-3 days. However, it can last longer if it is a sign of something more serious. It is important to treat your diarrhea as told by your health care provider. Follow these instructions at home: Eating and drinking     Follow these recommendations as told by your health care provider: Take an oral rehydration solution (ORS). This is an over-the-counter medicine that helps return your body to its normal balance of nutrients and water. It is found at pharmacies and retail stores. Drink plenty of fluids, such as water, ice chips, diluted fruit juice, and low-calorie sports drinks. You can drink milk also, if desired. Avoid drinking fluids that contain a lot of sugar or caffeine, such as energy drinks, sports drinks, and soda. Eat bland, easy-to-digest foods in small amounts as you are able. These foods include bananas, applesauce, rice, lean meats, toast, and crackers. Avoid alcohol. Avoid spicy or fatty foods.  Medicines Take over-the-counter and prescription medicines only as told by your health care provider. If you were prescribed an antibiotic medicine, take it as told by your health care provider. Do not stop using the antibiotic even if you start to feel better. General instructions  Wash your hands often using soap and water. If soap and water are not available, use a hand sanitizer. Others in the household should wash their hands as well. Hands should be washed: After using the toilet or changing a diaper. Before preparing, cooking, or serving food. While caring for a sick person or while visiting someone in a hospital. Drink enough fluid to keep your urine pale yellow. Rest at home while you recover. Watch your  condition for any changes. Take a warm bath to relieve any burning or pain from frequent diarrhea episodes. Keep all follow-up visits as told by your health care provider. This is important. Contact a health care provider if: You have a fever. Your diarrhea gets worse. You have new symptoms. You cannot keep fluids down. You feel light-headed or dizzy. You have a headache. You have muscle cramps. Get help right away if: You have chest pain. You feel extremely weak or you faint. You have bloody or black stools or stools that look like tar. You have severe pain, cramping, or bloating in your abdomen. You have trouble breathing or you are breathing very quickly. Your heart is beating very quickly. Your skin feels cold and clammy. You feel confused. You have signs of dehydration, such as: Dark urine, very little urine, or no urine. Cracked lips. Dry mouth. Sunken eyes. Sleepiness. Weakness. Summary Diarrhea is frequent loose and sometimes watery bowel movements. Diarrhea can make you feel weak and cause you to become dehydrated. Drink enough fluids to keep your urine pale yellow. Make sure that you wash your hands after using the toilet. If soap and water are not available, use hand sanitizer. Contact a health care provider if your diarrhea gets worse or you have new symptoms. Get help right away if you have signs of dehydration. This information is not intended to replace advice given to you by your health care provider. Make sure you discuss any questions you have with your health care provider. Document Revised: 11/03/2021 Document Reviewed: 02/22/2021 Elsevier Patient Education  [place].

## 2022-04-26 NOTE — Telephone Encounter (Signed)
See telephone note.

## 2022-05-03 ENCOUNTER — Other Ambulatory Visit: Payer: Self-pay | Admitting: Family Medicine

## 2022-05-03 DIAGNOSIS — F419 Anxiety disorder, unspecified: Secondary | ICD-10-CM

## 2022-05-04 NOTE — Telephone Encounter (Signed)
Requesting: lorazepam 0.74m  Contract:04/10/2018 UDS: 04/10/2018 Last Visit: 04/26/22 Next Visit: None Last Refill: 02/14/22 #60 and 1RF   Please Advise

## 2022-05-09 ENCOUNTER — Telehealth: Payer: Self-pay | Admitting: Family Medicine

## 2022-05-09 NOTE — Telephone Encounter (Signed)
Patient states that in one of the last medications she was sent, the warning advise her not to take it with other medications. She also wants to know if she can have an alternative to ciprofloxancin because it had messed up her stomach before. Please advise.

## 2022-05-10 ENCOUNTER — Other Ambulatory Visit: Payer: Self-pay

## 2022-05-10 DIAGNOSIS — E1165 Type 2 diabetes mellitus with hyperglycemia: Secondary | ICD-10-CM

## 2022-05-10 MED ORDER — INSULIN GLARGINE-YFGN 100 UNIT/ML ~~LOC~~ SOPN: PEN_INJECTOR | SUBCUTANEOUS | 1 refills | Status: DC

## 2022-05-11 ENCOUNTER — Other Ambulatory Visit: Payer: Self-pay | Admitting: Family Medicine

## 2022-05-11 ENCOUNTER — Telehealth: Payer: Self-pay | Admitting: Family Medicine

## 2022-05-11 DIAGNOSIS — R1032 Left lower quadrant pain: Secondary | ICD-10-CM

## 2022-05-11 NOTE — Telephone Encounter (Signed)
Patient requesting order for a complete CT scan to cover the top of her stomach to the lower intestines to rule out Diverticulitis. Patient would like to be able to get the imaging done later today or late tomorrow. Advised her that the imaging dept will call to schedule the appt. Please call patient to advise when order placed so she can also follow up to get appt scheduled.

## 2022-05-11 NOTE — Telephone Encounter (Signed)
Please advise 

## 2022-05-11 NOTE — Telephone Encounter (Signed)
error 

## 2022-05-11 NOTE — Telephone Encounter (Signed)
Noted. Pt has appt today

## 2022-05-13 ENCOUNTER — Encounter: Payer: Self-pay | Admitting: Family Medicine

## 2022-05-22 ENCOUNTER — Encounter: Payer: Self-pay | Admitting: Internal Medicine

## 2022-05-22 DIAGNOSIS — E1142 Type 2 diabetes mellitus with diabetic polyneuropathy: Secondary | ICD-10-CM

## 2022-05-23 ENCOUNTER — Other Ambulatory Visit (HOSPITAL_COMMUNITY): Payer: Self-pay

## 2022-05-23 MED ORDER — BASAGLAR KWIKPEN 100 UNIT/ML ~~LOC~~ SOPN: 50.0000 [IU] | PEN_INJECTOR | Freq: Two times a day (BID) | SUBCUTANEOUS | 1 refills | Status: DC

## 2022-05-23 NOTE — Telephone Encounter (Signed)
Test claim for Basaglar comes back for $25.00. Please advise if changing or PA is still requested.

## 2022-05-24 ENCOUNTER — Telehealth: Payer: Self-pay | Admitting: Family Medicine

## 2022-05-24 NOTE — Telephone Encounter (Signed)
Lattie Haw from [facility] called stating the order for a CT scan was denied for the place it was sent to, so they need a copy of the order to be faxed to [facility] and imaging instead at [phone]. Please advise.

## 2022-05-25 NOTE — Telephone Encounter (Signed)
Order faxed.

## 2022-06-05 ENCOUNTER — Other Ambulatory Visit (HOSPITAL_COMMUNITY): Payer: Self-pay

## 2022-06-06 ENCOUNTER — Other Ambulatory Visit (HOSPITAL_COMMUNITY): Payer: Self-pay

## 2022-06-08 ENCOUNTER — Other Ambulatory Visit (HOSPITAL_COMMUNITY): Payer: Self-pay

## 2022-06-15 ENCOUNTER — Encounter: Payer: Self-pay | Admitting: Internal Medicine

## 2022-06-15 ENCOUNTER — Ambulatory Visit: Payer: Commercial Managed Care - HMO | Admitting: Internal Medicine

## 2022-06-15 VITALS — BP 120/72 | HR 96 | Ht 70.0 in | Wt 267.6 lb

## 2022-06-15 DIAGNOSIS — E7849 Other hyperlipidemia: Secondary | ICD-10-CM

## 2022-06-15 DIAGNOSIS — E1165 Type 2 diabetes mellitus with hyperglycemia: Secondary | ICD-10-CM | POA: Diagnosis not present

## 2022-06-15 DIAGNOSIS — E1142 Type 2 diabetes mellitus with diabetic polyneuropathy: Secondary | ICD-10-CM | POA: Diagnosis not present

## 2022-06-15 LAB — POCT GLYCOSYLATED HEMOGLOBIN (HGB A1C): Hemoglobin A1C: 8.4 % — AB (ref 4.0–5.6)

## 2022-06-15 NOTE — Patient Instructions (Addendum)
Please continue: - Metformin 1000 mg 2x a day with meals  - Semglee/Basaglar 50 units in am and 50 units at night - Novolin R 20-30 units 30 min before meals  Please try to increase: - Rybelsus to 7 mg daily before b'fast as tolerated   Please return in 3-4 months.
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
## 2022-06-18 ENCOUNTER — Other Ambulatory Visit: Payer: Self-pay | Admitting: Family Medicine

## 2022-06-18 DIAGNOSIS — F419 Anxiety disorder, unspecified: Secondary | ICD-10-CM

## 2022-06-19 NOTE — Telephone Encounter (Signed)
Requesting: lorazepam 0.65m Contract: 04/10/18 UDS: 04/10/18 Last Visit: 04/26/22 Next Visit: none Last Refill: 05/06/22  Please Advise

## 2022-06-28 ENCOUNTER — Encounter: Payer: Self-pay | Admitting: Family Medicine

## 2022-06-29 ENCOUNTER — Other Ambulatory Visit: Payer: Self-pay | Admitting: Family Medicine

## 2022-06-29 MED ORDER — METRONIDAZOLE 500 MG PO TABS: 500.0000 mg | ORAL_TABLET | Freq: Three times a day (TID) | ORAL | 0 refills | Status: DC

## 2022-06-29 NOTE — Telephone Encounter (Signed)
We do not have any more openings for pt to be seen and she would like to know if pcp can send in flagyl as UC is too expensive with her new ins. Please advise, pt is not able to eat anything w/o terrible stomach pain.

## 2022-07-06 ENCOUNTER — Telehealth: Payer: Self-pay

## 2022-07-06 DIAGNOSIS — E1165 Type 2 diabetes mellitus with hyperglycemia: Secondary | ICD-10-CM

## 2022-07-06 NOTE — Telephone Encounter (Signed)
Received a fax from [facility] regarding Prior Authorization for Rybelsus 49m tablet.   Authorization has been DENIED due to There is no clinical information indicating your patient has met both of the following: A) Individual will continue to maximally tolerated metformin therapy, if not contraindicated per FDA label, intolerant, or otherwise not a candidate -and- B) Documentation of one of the following: 1. Unable to achieve goal HbA1C despite 90 consecutive days of metformin or metformin-containing regimen (meglitinides, sulfonylureas, or thiazolidinediones) at greater than or equal to 1,500 mg per day; 2. Intolerance to metformin 1,500 mg per day despite appropriate dose titration duration (for example, period of 8-12 week); 3. Contraindication to metformin per FDA label (for example, acute/chronic metabolic acidosis, severe renal dysfunction); 4. Not a candidate for metformin (for example, hepatic impairment, moderate renal dysfunction, unstable heart failure).  For individuals able to inject (for example using insulin), Rybelsus is considered medically necessary when the following criteria are met: Documented contraindication per FDA label, intolerance, or inability to use all of the following: (A) Bydureon, Bydureon Bcise, OR Byetta (B) Trulicity. Rybelsus is medically necessary when all the following are met: 1. Individual is 157years of age or older. 2. Type 2 diabetes mellitus 3. Metformin Requirement Criteria* 4. For individuals able to inject medication (for example using insulin) - documented contraindication per FDA  label, intolerance, or inability to use Trulicity and Bydureon/Bydureon Bcise/Byetta 5. For individuals not able to inject medication, both of the following: A. Inadequately controlled on 2 concurrent alternatives from different anti-hyperglycemic classes + metformin (Unless not a candidate for metformin as described in Metformin Requirement Criteria above) B. Documented inability to  administer injections (for example, physical impairment, visual impairement) *Metformin requirement criteria is both of the following: 1. Individual will continue maximally tolerated tolerated metformin therapy, if not contraindicated per FDA label, intolerant, or otherwise not a candidate 2. Documentation of one of the following: A. Unable to achieve goal HbA1C despite 90 consecutive days of metformin or metformin-containing regimen (meglitinides, sulfonylureas, or thiazolidinediones) at greater than or equal to 1,500 mg per day B. Intolerance to metformin 1,500 mg per day despite appropriate dose titration duration (for example, acute/chronic metabolic acidosis, severe renal dysfunction) D. Not a candidate for metformin (for example, hepatic impairment, moderate renal dysfunction, unstable heart failure).  Determination letter attached to file.

## 2022-07-06 NOTE — Telephone Encounter (Signed)
Patient Advocate Encounter   Received notification from [employer] that prior authorization for Rybelsus 69m is required.   PA submitted on 07/06/2022 Key [id number] is pending       AJoneen Boers COrchardPatient Advocate Specialist CWhispering PinesPatient Advocate Team Direct Number: [phone]: [phone]

## 2022-07-11 ENCOUNTER — Other Ambulatory Visit (HOSPITAL_COMMUNITY): Payer: Self-pay

## 2022-07-11 ENCOUNTER — Encounter: Payer: Self-pay | Admitting: Internal Medicine

## 2022-08-06 ENCOUNTER — Other Ambulatory Visit: Payer: Self-pay | Admitting: Family Medicine

## 2022-08-06 ENCOUNTER — Telehealth: Payer: Self-pay

## 2022-08-06 NOTE — Telephone Encounter (Signed)
Either double up on the 3 mg dose or take the 7 mg dose.  We can send the application with the 7 mg dose.

## 2022-08-06 NOTE — Telephone Encounter (Signed)
Pt called to advise she does not want to go back on injectables and would like to apply for patient assistance for Rybelsus. Application mailed to address on file to be completed. Pt also requested a sample until a determination is made.

## 2022-08-06 NOTE — Telephone Encounter (Signed)
Pt notified sample of 3 mg Rybelsus is labeled and ready for pick up when needed. Pt's application has been mailed.

## 2022-08-09 ENCOUNTER — Other Ambulatory Visit (HOSPITAL_COMMUNITY): Payer: Self-pay

## 2022-08-09 NOTE — Telephone Encounter (Addendum)
Pharmacy Patient Advocate Encounter  Prior Authorization for Rybelsus 60m has been approved.    PA# case # [id number] dates: 12/14/223 through 08/09/2023  Unfortunately the pt has a high deductible and a 30 day supply is $878.68  Case # [id number] approved for the 339m

## 2022-08-09 NOTE — Telephone Encounter (Signed)
THANK YOU

## 2022-08-10 ENCOUNTER — Other Ambulatory Visit (HOSPITAL_COMMUNITY): Payer: Self-pay

## 2022-08-10 NOTE — Telephone Encounter (Signed)
So, can she get the 7 mg? I am sorry -I read the message few times, but... long day... Thank you! CG

## 2022-08-14 ENCOUNTER — Other Ambulatory Visit (HOSPITAL_COMMUNITY): Payer: Self-pay

## 2022-08-15 NOTE — Telephone Encounter (Signed)
Would she agree to try a low-dose Ozempic, 0.25 mg weekly and see how she does on this?  Another option is Trulicity 2.42 mg weekly.  If so, we can send this to the pharmacy.

## 2022-08-17 NOTE — Telephone Encounter (Signed)
Called and lvm for pt to call back. Mychart message sent as well.

## 2022-08-21 ENCOUNTER — Telehealth: Payer: Self-pay | Admitting: Family Medicine

## 2022-08-21 NOTE — Telephone Encounter (Signed)
Pt states her mother died last night and she was hoping she could be prescribed something to ease her anxiety. She stated she was given something back in November 04, 2017 when her son died but was not sure of there name. Please advise.    [facility] [id number] [place], [place] [address] Phone: [phone]  Fax: [phone]

## 2022-08-23 NOTE — Telephone Encounter (Signed)
Spoke with patient. Pt states the Lorazepam is her "work medication" and states she feels that she needs additional medication. Please advise

## 2022-08-23 NOTE — Telephone Encounter (Signed)
Attempted to call patient on both numbers. Home number kept ringing. Left VM on cell

## 2022-08-24 ENCOUNTER — Other Ambulatory Visit (HOSPITAL_COMMUNITY): Payer: Self-pay

## 2022-08-24 MED ORDER — TRULICITY 0.75 MG/0.5ML ~~LOC~~ SOAJ: 0.7500 mg | SUBCUTANEOUS | 5 refills | Status: DC

## 2022-08-24 NOTE — Addendum Note (Signed)
Addended by: Lauralyn Primes on: 08/24/2022 05:12 PM   Modules accepted: Orders

## 2022-08-24 NOTE — Telephone Encounter (Signed)
Rx sent to pharmacy   

## 2022-08-24 NOTE — Telephone Encounter (Signed)
In that case, I would definitely recommend to start Trulicity at the 9.39 mg dose weekly and we can increase from there slowly.  I will suggest to call in a 2 mL supply with 5 refills for now if she agrees to try it.   She did try an injectable GLP-1 receptor agonist in the past, but this was Victoza.  I would definitely give Trulicity a chance, especially at the lowest dose.  Trulicity can help with diabetes along with appetite, weight, and liver.

## 2022-08-24 NOTE — Telephone Encounter (Signed)
Ozempic is non-formulary and Trulicity is a $56.70 co-pay for maximum 30 day supply

## 2022-08-28 ENCOUNTER — Encounter: Payer: Self-pay | Admitting: Family Medicine

## 2022-08-30 ENCOUNTER — Other Ambulatory Visit: Payer: Self-pay | Admitting: Family Medicine

## 2022-08-30 DIAGNOSIS — R1011 Right upper quadrant pain: Secondary | ICD-10-CM

## 2022-09-04 ENCOUNTER — Encounter: Payer: Self-pay | Admitting: Internal Medicine

## 2022-09-04 DIAGNOSIS — E1142 Type 2 diabetes mellitus with diabetic polyneuropathy: Secondary | ICD-10-CM

## 2022-09-05 ENCOUNTER — Other Ambulatory Visit (HOSPITAL_COMMUNITY): Payer: Self-pay

## 2022-09-05 ENCOUNTER — Telehealth: Payer: Self-pay

## 2022-09-05 NOTE — Telephone Encounter (Signed)
Patient Advocate Encounter   Received notification from [employer] that prior authorization is required for Rybelsus 7MG  tablets  Submitted: 09-05-2022 Key [id number]  Status is pending

## 2022-09-06 MED ORDER — NOVOLOG FLEXPEN 100 UNIT/ML ~~LOC~~ SOPN: 20.0000 [IU] | PEN_INJECTOR | Freq: Three times a day (TID) | SUBCUTANEOUS | 5 refills | Status: DC

## 2022-09-06 MED ORDER — LEVEMIR FLEXPEN 100 UNIT/ML ~~LOC~~ SOPN: 50.0000 [IU] | PEN_INJECTOR | Freq: Two times a day (BID) | SUBCUTANEOUS | 5 refills | Status: DC

## 2022-09-07 MED ORDER — TRESIBA FLEXTOUCH 100 UNIT/ML ~~LOC~~ SOPN: 50.0000 [IU] | PEN_INJECTOR | Freq: Two times a day (BID) | SUBCUTANEOUS | 0 refills | Status: DC

## 2022-09-11 NOTE — Telephone Encounter (Signed)
Patient Advocate Encounter  Prior Authorization for Rybelsus 7MG  tablets has been approved through [employer].    Key: [id number]  Effective: 09-08-2022 to 09-09-2023

## 2022-09-12 ENCOUNTER — Encounter: Payer: Self-pay | Admitting: Family Medicine

## 2022-09-12 DIAGNOSIS — F419 Anxiety disorder, unspecified: Secondary | ICD-10-CM

## 2022-09-12 MED ORDER — LORAZEPAM 0.5 MG PO TABS: 0.5000 mg | ORAL_TABLET | Freq: Two times a day (BID) | ORAL | 1 refills | Status: DC | PRN

## 2022-09-12 NOTE — Telephone Encounter (Signed)
Requesting: Ativan Contract: 2019 UDS: 2019 Last OV: 04/26/2022 Next OV: N/A Last Refill: 06/19/2022, #60--1 RF Database:   Please advise

## 2022-09-20 ENCOUNTER — Other Ambulatory Visit (HOSPITAL_COMMUNITY): Payer: Self-pay

## 2022-09-24 ENCOUNTER — Other Ambulatory Visit: Payer: Self-pay | Admitting: Family Medicine

## 2022-09-24 ENCOUNTER — Other Ambulatory Visit: Payer: Self-pay | Admitting: Internal Medicine

## 2022-09-24 DIAGNOSIS — I10 Essential (primary) hypertension: Secondary | ICD-10-CM

## 2022-09-24 DIAGNOSIS — E1142 Type 2 diabetes mellitus with diabetic polyneuropathy: Secondary | ICD-10-CM

## 2022-10-03 ENCOUNTER — Encounter: Payer: Self-pay | Admitting: Family Medicine

## 2022-10-04 ENCOUNTER — Telehealth: Payer: 59 | Admitting: Medical

## 2022-10-12 ENCOUNTER — Other Ambulatory Visit: Payer: Self-pay | Admitting: Family Medicine

## 2022-10-12 ENCOUNTER — Encounter: Payer: Self-pay | Admitting: Family Medicine

## 2022-10-12 MED ORDER — DOXYCYCLINE HYCLATE 100 MG PO TABS: 100.0000 mg | ORAL_TABLET | Freq: Two times a day (BID) | ORAL | 0 refills | Status: DC

## 2022-10-16 ENCOUNTER — Encounter: Payer: Self-pay | Admitting: Family Medicine

## 2022-10-16 NOTE — Telephone Encounter (Signed)
Pt declined vv due to copay.

## 2022-10-19 ENCOUNTER — Ambulatory Visit: Payer: Commercial Managed Care - HMO | Admitting: Internal Medicine

## 2022-11-04 ENCOUNTER — Other Ambulatory Visit: Payer: Self-pay | Admitting: Family Medicine

## 2022-11-06 ENCOUNTER — Telehealth: Payer: Self-pay | Admitting: Family Medicine

## 2022-11-06 MED ORDER — BUPROPION HCL ER (XL) 150 MG PO TB24: ORAL_TABLET | ORAL | 0 refills | Status: DC

## 2022-11-06 NOTE — Telephone Encounter (Signed)
Pt called stating that she needed to have her Rx for Wellbutrin rerouted to the following pharmacy:  [place], [address] [place], [place] [address] Phone: [phone]  Fax: [phone]

## 2022-11-06 NOTE — Telephone Encounter (Signed)
Rx sent to new pharmacy.

## 2022-11-20 ENCOUNTER — Encounter: Payer: Self-pay | Admitting: Family Medicine

## 2022-11-20 ENCOUNTER — Ambulatory Visit (INDEPENDENT_AMBULATORY_CARE_PROVIDER_SITE_OTHER): Payer: 59 | Admitting: Family Medicine

## 2022-11-20 VITALS — BP 150/78 | HR 96 | Temp 97.9°F | Resp 18 | Ht 70.0 in | Wt 267.8 lb

## 2022-11-20 DIAGNOSIS — Z0000 Encounter for general adult medical examination without abnormal findings: Secondary | ICD-10-CM

## 2022-11-20 DIAGNOSIS — E119 Type 2 diabetes mellitus without complications: Secondary | ICD-10-CM

## 2022-11-20 DIAGNOSIS — Z794 Long term (current) use of insulin: Secondary | ICD-10-CM

## 2022-11-20 DIAGNOSIS — E1165 Type 2 diabetes mellitus with hyperglycemia: Secondary | ICD-10-CM

## 2022-11-20 DIAGNOSIS — U099 Post covid-19 condition, unspecified: Secondary | ICD-10-CM

## 2022-11-20 DIAGNOSIS — E785 Hyperlipidemia, unspecified: Secondary | ICD-10-CM

## 2022-11-20 DIAGNOSIS — Z0001 Encounter for general adult medical examination with abnormal findings: Secondary | ICD-10-CM

## 2022-11-20 DIAGNOSIS — I10 Essential (primary) hypertension: Secondary | ICD-10-CM

## 2022-11-20 DIAGNOSIS — E559 Vitamin D deficiency, unspecified: Secondary | ICD-10-CM | POA: Diagnosis not present

## 2022-11-20 DIAGNOSIS — F418 Other specified anxiety disorders: Secondary | ICD-10-CM | POA: Diagnosis not present

## 2022-11-20 DIAGNOSIS — E1169 Type 2 diabetes mellitus with other specified complication: Secondary | ICD-10-CM

## 2022-11-20 DIAGNOSIS — E1142 Type 2 diabetes mellitus with diabetic polyneuropathy: Secondary | ICD-10-CM | POA: Diagnosis not present

## 2022-11-20 DIAGNOSIS — F419 Anxiety disorder, unspecified: Secondary | ICD-10-CM

## 2022-11-20 MED ORDER — LORAZEPAM 0.5 MG PO TABS: 0.5000 mg | ORAL_TABLET | Freq: Two times a day (BID) | ORAL | 1 refills | Status: DC | PRN
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
## 2022-11-20 NOTE — Patient Instructions (Signed)
Preventive Care 40-64 Years Old, Female Preventive care refers to lifestyle choices and visits with your health care provider that can promote health and wellness. Preventive care visits are also called wellness exams. What can I expect for my preventive care visit? Counseling Your health care provider may ask you questions about your: Medical history, including: Past medical problems. Family medical history. Pregnancy history. Current health, including: Menstrual cycle. Method of birth control. Emotional well-being. Home life and relationship well-being. Sexual activity and sexual health. Lifestyle, including: Alcohol, nicotine or tobacco, and drug use. Access to firearms. Diet, exercise, and sleep habits. Work and work environment. Sunscreen use. Safety issues such as seatbelt and bike helmet use. Physical exam Your health care provider will check your: Height and weight. These may be used to calculate your BMI (body mass index). BMI is a measurement that tells if you are at a healthy weight. Waist circumference. This measures the distance around your waistline. This measurement also tells if you are at a healthy weight and may help predict your risk of certain diseases, such as type 2 diabetes and high blood pressure. Heart rate and blood pressure. Body temperature. Skin for abnormal spots. What immunizations do I need?  Vaccines are usually given at various ages, according to a schedule. Your health care provider will recommend vaccines for you based on your age, medical history, and lifestyle or other factors, such as travel or where you work. What tests do I need? Screening Your health care provider may recommend screening tests for certain conditions. This may include: Lipid and cholesterol levels. Diabetes screening. This is done by checking your blood sugar (glucose) after you have not eaten for a while (fasting). Pelvic exam and Pap test. Hepatitis B test. Hepatitis C  test. HIV (human immunodeficiency virus) test. STI (sexually transmitted infection) testing, if you are at risk. Lung cancer screening. Colorectal cancer screening. Mammogram. Talk with your health care provider about when you should start having regular mammograms. This may depend on whether you have a family history of breast cancer. BRCA-related cancer screening. This may be done if you have a family history of breast, ovarian, tubal, or peritoneal cancers. Bone density scan. This is done to screen for osteoporosis. Talk with your health care provider about your test results, treatment options, and if necessary, the need for more tests. Follow these instructions at home: Eating and drinking  Eat a diet that includes fresh fruits and vegetables, whole grains, lean protein, and low-fat dairy products. Take vitamin and mineral supplements as recommended by your health care provider. Do not drink alcohol if: Your health care provider tells you not to drink. You are pregnant, may be pregnant, or are planning to become pregnant. If you drink alcohol: Limit how much you have to 0-1 drink a day. Know how much alcohol is in your drink. In the [place] one drink equals one 12 oz bottle of beer (355 mL), one 5 oz glass of wine (148 mL), or one 1 oz glass of hard liquor (44 mL). Lifestyle Brush your teeth every morning and night with fluoride toothpaste. Floss one time each day. Exercise for at least 30 minutes 5 or more days each week. Do not use any products that contain nicotine or tobacco. These products include cigarettes, chewing tobacco, and vaping devices, such as e-cigarettes. If you need help quitting, ask your health care provider. Do not use drugs. If you are sexually active, practice safe sex. Use a condom or other form of protection to   prevent STIs. If you do not wish to become pregnant, use a form of birth control. If you plan to become pregnant, see your health care provider for a  prepregnancy visit. Take aspirin only as told by your health care provider. Make sure that you understand how much to take and what form to take. Work with your health care provider to find out whether it is safe and beneficial for you to take aspirin daily. Find healthy ways to manage stress, such as: Meditation, yoga, or listening to music. Journaling. Talking to a trusted person. Spending time with friends and family. Minimize exposure to UV radiation to reduce your risk of skin cancer. Safety Always wear your seat belt while driving or riding in a vehicle. Do not drive: If you have been drinking alcohol. Do not ride with someone who has been drinking. When you are tired or distracted. While texting. If you have been using any mind-altering substances or drugs. Wear a helmet and other protective equipment during sports activities. If you have firearms in your house, make sure you follow all gun safety procedures. Seek help if you have been physically or sexually abused. What's next? Visit your health care provider once a year for an annual wellness visit. Ask your health care provider how often you should have your eyes and teeth checked. Stay up to date on all vaccines. This information is not intended to replace advice given to you by your health care provider. Make sure you discuss any questions you have with your health care provider. Document Revised: 02/08/2021 Document Reviewed: 02/08/2021 Elsevier Patient Education  2023 Elsevier Inc.  

## 2022-11-21 DIAGNOSIS — Z0000 Encounter for general adult medical examination without abnormal findings: Secondary | ICD-10-CM | POA: Insufficient documentation

## 2022-11-21 LAB — CBC WITH DIFFERENTIAL/PLATELET
Basophils Absolute: 0 10*3/uL (ref 0.0–0.1)
Basophils Relative: 1.1 % (ref 0.0–3.0)
Eosinophils Absolute: 0.1 10*3/uL (ref 0.0–0.7)
Eosinophils Relative: 3.6 % (ref 0.0–5.0)
HCT: 39.9 % (ref 36.0–46.0)
Hemoglobin: 13.6 g/dL (ref 12.0–15.0)
Lymphocytes Relative: 28.7 % (ref 12.0–46.0)
Lymphs Abs: 1 10*3/uL (ref 0.7–4.0)
MCHC: 34.2 g/dL (ref 30.0–36.0)
MCV: 84.2 fl (ref 78.0–100.0)
Monocytes Absolute: 0.2 10*3/uL (ref 0.1–1.0)
Monocytes Relative: 5.8 % (ref 3.0–12.0)
Neutro Abs: 2 10*3/uL (ref 1.4–7.7)
Neutrophils Relative %: 60.8 % (ref 43.0–77.0)
Platelets: 123 10*3/uL — ABNORMAL LOW (ref 150.0–400.0)
RBC: 4.74 Mil/uL (ref 3.87–5.11)
RDW: 15.4 % (ref 11.5–15.5)
WBC: 3.3 10*3/uL — ABNORMAL LOW (ref 4.0–10.5)

## 2022-11-21 LAB — COMPREHENSIVE METABOLIC PANEL
ALT: 23 U/L (ref 0–35)
AST: 25 U/L (ref 0–37)
Albumin: 4.3 g/dL (ref 3.5–5.2)
Alkaline Phosphatase: 134 U/L — ABNORMAL HIGH (ref 39–117)
BUN: 8 mg/dL (ref 6–23)
CO2: 30 mEq/L (ref 19–32)
Calcium: 9.8 mg/dL (ref 8.4–10.5)
Chloride: 98 mEq/L (ref 96–112)
Creatinine, Ser: 0.8 mg/dL (ref 0.40–1.20)
GFR: 78.76 mL/min (ref >60.00)
Glucose, Bld: 204 mg/dL — ABNORMAL HIGH (ref 70–99)
Potassium: 3.8 mEq/L (ref 3.5–5.1)
Sodium: 136 mEq/L (ref 135–145)
Total Bilirubin: 0.7 mg/dL (ref 0.2–1.2)
Total Protein: 7.6 g/dL (ref 6.0–8.3)

## 2022-11-21 LAB — HEMOGLOBIN A1C: Hgb A1c MFr Bld: 7.8 % — ABNORMAL HIGH (ref 4.6–6.5)

## 2022-11-21 LAB — MICROALBUMIN / CREATININE URINE RATIO
Creatinine,U: 20.9 mg/dL
Microalb Creat Ratio: 8.3 mg/g (ref 0.0–30.0)
Microalb, Ur: 1.7 mg/dL (ref 0.0–1.9)

## 2022-11-21 LAB — LIPID PANEL
Cholesterol: 237 mg/dL — ABNORMAL HIGH (ref 0–200)
HDL: 68.4 mg/dL (ref >39.00)
NonHDL: 168.87
Total CHOL/HDL Ratio: 3
Triglycerides: 294 mg/dL — ABNORMAL HIGH (ref 0.0–149.0)
VLDL: 58.8 mg/dL — ABNORMAL HIGH (ref 0.0–40.0)

## 2022-11-21 LAB — LDL CHOLESTEROL, DIRECT: Direct LDL: 123 mg/dL

## 2022-11-21 LAB — VITAMIN D 25 HYDROXY (VIT D DEFICIENCY, FRACTURES): VITD: 19.68 ng/mL — ABNORMAL LOW (ref 30.00–100.00)

## 2022-11-21 LAB — TSH: TSH: 0.8 u[IU]/mL (ref 0.35–5.50)

## 2022-11-21 NOTE — Assessment & Plan Note (Signed)
Per endo °

## 2022-11-21 NOTE — Assessment & Plan Note (Signed)
Ghm utd Check labs  See AVS  Health Maintenance  Topic Date Due   PAP SMEAR-Modifier  04/03/2015   MAMMOGRAM  03/16/2017   Diabetic kidney evaluation - Urine ACR  10/13/2022   COVID-19 Vaccine (1) 12/06/2022 (Originally 02/06/1966)   Zoster Vaccines- Shingrix (1 of 2) 02/20/2023 (Originally 02/07/1979)   INFLUENZA VACCINE  06/12/2024 (Originally 03/27/2022)   Hepatitis C Screening  04/10/2029 (Originally 02/06/1978)   HIV Screening  04/10/2029 (Originally 02/07/1975)   HEMOGLOBIN A1C  12/15/2022   FOOT EXAM  03/03/2023   OPHTHALMOLOGY EXAM  03/28/2023   Diabetic kidney evaluation - eGFR measurement  04/04/2023   COLONOSCOPY (Pts 45-77yrs Insurance coverage will need to be confirmed)  10/22/2027   DTaP/Tdap/Td (3 - Td or Tdap) 09/16/2030   HPV VACCINES  Aged Out

## 2022-11-21 NOTE — Assessment & Plan Note (Signed)
Con't wellbutrin and as needed ativan

## 2022-11-21 NOTE — Assessment & Plan Note (Signed)
Encourage heart healthy diet such as MIND or DASH diet, increase exercise, avoid trans fats, simple carbohydrates and processed foods, consider a krill or fish or flaxseed oil cap daily.  °

## 2022-11-22 ENCOUNTER — Other Ambulatory Visit: Payer: Self-pay

## 2022-11-22 MED ORDER — ROSUVASTATIN CALCIUM 10 MG PO TABS: 10.0000 mg | ORAL_TABLET | Freq: Every day | ORAL | 2 refills | Status: DC

## 2022-11-28 ENCOUNTER — Telehealth: Payer: Self-pay

## 2022-11-28 NOTE — Telephone Encounter (Signed)
PA initiated via Covermymeds; KEY: [id number]. Awaiting determination.

## 2022-12-03 MED ORDER — ATORVASTATIN CALCIUM 40 MG PO TABS: 40.0000 mg | ORAL_TABLET | Freq: Every day | ORAL | 0 refills | Status: DC

## 2022-12-03 NOTE — Telephone Encounter (Signed)
PA denied. Must try and fail atorvastatin 40mg -80mg  first.    trial and failure or are unable to use atorvastatin 40 milligrams (mg) to 80 mg daily

## 2022-12-03 NOTE — Telephone Encounter (Signed)
Rx sent. Mychart message sent to Pt.  

## 2022-12-03 NOTE — Telephone Encounter (Signed)
Zola Button, Grayling Congress, DO  You; Luster Landsberg M, CMA1 hour ago (8:48 AM)    Change to lipitor 40 mg 1 po qhs #30  2 refills

## 2022-12-21 ENCOUNTER — Ambulatory Visit: Payer: 59 | Admitting: Internal Medicine

## 2022-12-21 ENCOUNTER — Encounter: Payer: Self-pay | Admitting: Internal Medicine

## 2022-12-21 VITALS — BP 138/82 | HR 95 | Ht 70.0 in | Wt 269.0 lb

## 2022-12-21 DIAGNOSIS — E66812 Obesity, class 2: Secondary | ICD-10-CM

## 2022-12-21 DIAGNOSIS — Z794 Long term (current) use of insulin: Secondary | ICD-10-CM | POA: Diagnosis not present

## 2022-12-21 DIAGNOSIS — Z7984 Long term (current) use of oral hypoglycemic drugs: Secondary | ICD-10-CM | POA: Diagnosis not present

## 2022-12-21 DIAGNOSIS — E1165 Type 2 diabetes mellitus with hyperglycemia: Secondary | ICD-10-CM | POA: Diagnosis not present

## 2022-12-21 DIAGNOSIS — E1142 Type 2 diabetes mellitus with diabetic polyneuropathy: Secondary | ICD-10-CM

## 2022-12-21 DIAGNOSIS — E7849 Other hyperlipidemia: Secondary | ICD-10-CM

## 2022-12-21 DIAGNOSIS — E669 Obesity, unspecified: Secondary | ICD-10-CM
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
## 2022-12-21 NOTE — Patient Instructions (Addendum)
Please continue: - Metformin 1000 mg 2x a day with meals  - Novolog 20-30 units 30 min before meals  Increase: - Tresiba 45-50 units 2x a day  Please try to restart: - Rybelsus 3 mg and then increase to 6-7 mg daily before b'fast as tolerated   Check the sensor with the phone. Code: [id number]  Please return in 3-4 months.

## 2022-12-29 ENCOUNTER — Other Ambulatory Visit: Payer: Self-pay | Admitting: Internal Medicine

## 2022-12-29 DIAGNOSIS — E1142 Type 2 diabetes mellitus with diabetic polyneuropathy: Secondary | ICD-10-CM

## 2023-01-01 ENCOUNTER — Other Ambulatory Visit: Payer: Self-pay

## 2023-01-01 ENCOUNTER — Encounter: Payer: Self-pay | Admitting: Internal Medicine

## 2023-01-01 DIAGNOSIS — E1165 Type 2 diabetes mellitus with hyperglycemia: Secondary | ICD-10-CM

## 2023-01-01 MED ORDER — FREESTYLE PRECISION NEO TEST VI STRP: ORAL_STRIP | 0 refills | Status: DC

## 2023-01-01 MED ORDER — METFORMIN HCL ER 500 MG PO TB24: ORAL_TABLET | ORAL | 1 refills | Status: DC

## 2023-01-01 MED ORDER — NOVOLOG FLEXPEN 100 UNIT/ML ~~LOC~~ SOPN: 20.0000 [IU] | PEN_INJECTOR | Freq: Three times a day (TID) | SUBCUTANEOUS | 5 refills | Status: DC

## 2023-01-02 ENCOUNTER — Other Ambulatory Visit (HOSPITAL_COMMUNITY): Payer: Self-pay

## 2023-01-02 ENCOUNTER — Telehealth: Payer: Self-pay

## 2023-01-02 NOTE — Telephone Encounter (Signed)
Patient Advocate Encounter   Received notification from FAX that prior authorization is required for Freestyle precision test strips  Insurance covers accu-chek products.   Please advise

## 2023-01-03 NOTE — Telephone Encounter (Signed)
We can try a PA for this test strips, since she has a freestyle libre CGM, but if not approved, we will need to go with Accu-Chek and may need another meter Rx sent.

## 2023-01-03 NOTE — Telephone Encounter (Signed)
Patient Advocate Encounter   Received notification from pt msgs that prior authorization is required for FreeStyle Precision Neo Test strips  Submitted: 01/03/23 Key [id number]  Status is pending

## 2023-01-07 MED ORDER — ACCU-CHEK SOFTCLIX LANCETS MISC: 1 refills | Status: DC

## 2023-01-07 MED ORDER — ACCU-CHEK GUIDE VI STRP: ORAL_STRIP | 1 refills | Status: DC

## 2023-01-07 MED ORDER — ACCU-CHEK GUIDE W/DEVICE KIT: PACK | 0 refills | Status: DC

## 2023-01-07 NOTE — Telephone Encounter (Signed)
Pharmacy Patient Advocate Encounter  Received notification that the request for prior authorization has been denied      

## 2023-01-07 NOTE — Addendum Note (Signed)
Addended by: Kenyon Ana on: 01/07/2023 04:52 PM   Modules accepted: Orders

## 2023-01-07 NOTE — Telephone Encounter (Signed)
Rx sent 

## 2023-02-09 ENCOUNTER — Other Ambulatory Visit: Payer: Self-pay | Admitting: Family Medicine

## 2023-02-09 DIAGNOSIS — F419 Anxiety disorder, unspecified: Secondary | ICD-10-CM

## 2023-02-11 ENCOUNTER — Encounter: Payer: Self-pay | Admitting: Family Medicine

## 2023-02-11 NOTE — Telephone Encounter (Signed)
Requesting: lorazepam 0.5mg   Contract:04/10/2018 UDS:04/10/2018 Last Visit: 11/20/22 Next Visit: None Last Refill: 11/20/22 #60 and 1RF  Please Advise

## 2023-02-21 NOTE — Telephone Encounter (Signed)
Pt called to follow up on letter status. Pt stated that she needs a letter for her to give to her employer to verify tat she is looking to file FMLA prior to starting the FMLA process.

## 2023-02-28 ENCOUNTER — Other Ambulatory Visit: Payer: Self-pay | Admitting: Family Medicine

## 2023-03-07 ENCOUNTER — Telehealth: Payer: Self-pay | Admitting: Family Medicine

## 2023-03-07 NOTE — Telephone Encounter (Signed)
Pt states she is having some chest ains that she thinks may be heart burn. She wanted an appt but it does not looks like we have nay opening. Transferred to triage for nurse eval/ recommendation.

## 2023-03-08 ENCOUNTER — Telehealth: Payer: 59

## 2023-03-08 NOTE — Telephone Encounter (Signed)
See triage call.

## 2023-03-08 NOTE — Telephone Encounter (Signed)
Pt called. LVM to return call and schedule appt

## 2023-03-08 NOTE — Telephone Encounter (Signed)
Patient Name: Kimberly [name] DOB: February 26, 1961 Age: 62 Y 28 D [id number] Client [facility] - Client Client Site [facility] - Day Provider Seabron Spates- MD Contact Type Call Who Is Calling Patient / Member / Family / Caregiver Call Type Triage / Clinical Relationship To Patient Self Return Phone Number [phone] (Primary) Chief Complaint CHEST PAIN - pain, pressure, heaviness or tightness Reason for Call Symptomatic / Request for Health Information Initial Comment Caller has chest pain/ heartburn. The caller takes Protonix. Caller is transferred by the office to speak to a nurse. Caller does not want to go to ER, because her insurance will charge thousands. Translation No Nurse Assessment Nurse: Nunzio Cory, RN, Sherrie Date/Time (Eastern Time): 03/07/2023 4:46:56 PM Confirm and document reason for call. If symptomatic, describe symptoms. ---Caller states having indigestion feeling. Take Protonix. Seen Dentist on Monday Dx with oral infection. States CP at bottom of sternum. States CP hurts like indigestion not burning, No sour taste in mouth. States pain is coming and going but today lasting longer. @ present has eased off. No sweating or SOB. Left arm has felt different (tiny bit of numbness). Does the patient have any new or worsening symptoms? ---Yes Will a triage be completed? ---Yes Related visit to physician within the last 2 weeks? ---No Does the PT have any chronic conditions? (i.e. diabetes, asthma, this includes High risk factors for pregnancy, etc.) ---Yes List chronic conditions. ---HCTZ for HTN, Protonix (Indigestion), Type 2 Diabetic, Long COVID (Not vaccinated) Is this a behavioral health or substance abuse call? ---No Guidelines Guideline Title Affirmed Question Affirmed Notes Nurse Date/Time (Eastern Time) Chest Pain [1] Chest pain lasts > 5 minutes AND [2] age > 47 AND Nunzio Cory, RN, Sherrie 03/07/2023  4:53:35 PM PLEASE NOTE: All timestamps contained within this report are represented as [place] Standard Time. CONFIDENTIALTY NOTICE: This fax transmission is intended only for the addressee. It contains information that is legally privileged, confidential or otherwise protected from use or disclosure. If you are not the intended recipient, you are strictly prohibited from reviewing, disclosing, copying using or disseminating any of this information or taking any action in reliance on or regarding this information. If you have received this fax in error, please notify us immediately by telephone so that we can arrange for its return to [place]. Phone: [phone], Toll-Free: [phone], Fax: [phone] Page: 2 of 2 Call Id: [id number] Guidelines Guideline Title Affirmed Question Affirmed Notes Nurse Date/Time Lamount Cohen Time) [3] one or more cardiac risk factors (e.g., diabetes, high blood pressure, high cholesterol, smoker, or strong family history of heart disease) Disp. Time Lamount Cohen Time) Disposition Final User 03/07/2023 4:45:54 PM Send to Urgent Matilde Sprang 03/07/2023 4:58:22 PM Call EMS 911 Now Yes Nunzio Cory, RN, Sherrie Final Disposition 03/07/2023 4:58:22 PM Call EMS 911 Now Yes Nunzio Cory, RN, Sherrie Caller Disagree/Comply Disagree Caller Understands Yes PreDisposition InappropriateToAsk Care Advice Given Per Guideline

## 2023-03-15 ENCOUNTER — Other Ambulatory Visit (HOSPITAL_BASED_OUTPATIENT_CLINIC_OR_DEPARTMENT_OTHER): Payer: Self-pay | Admitting: Family Medicine

## 2023-03-15 DIAGNOSIS — Z139 Encounter for screening, unspecified: Secondary | ICD-10-CM

## 2023-03-23 ENCOUNTER — Ambulatory Visit (HOSPITAL_BASED_OUTPATIENT_CLINIC_OR_DEPARTMENT_OTHER)
Admission: RE | Admit: 2023-03-23 | Discharge: 2023-03-23 | Disposition: A | Discharge status: Home / Self Care | Payer: 59 | Source: Ambulatory Visit | Attending: Family Medicine | Admitting: Family Medicine

## 2023-03-23 DIAGNOSIS — Z1231 Encounter for screening mammogram for malignant neoplasm of breast: Secondary | ICD-10-CM | POA: Insufficient documentation

## 2023-03-23 DIAGNOSIS — Z139 Encounter for screening, unspecified: Secondary | ICD-10-CM | POA: Insufficient documentation

## 2023-03-26 ENCOUNTER — Other Ambulatory Visit: Payer: Self-pay | Admitting: Family Medicine

## 2023-03-26 DIAGNOSIS — F419 Anxiety disorder, unspecified: Secondary | ICD-10-CM

## 2023-03-26 NOTE — Telephone Encounter (Signed)
Requesting: lorazepam 0.5mg   Contract: 04/10/2018 UDS:04/10/2018 Last Visit: 11/20/22 Next Visit: None Last Refill: 02/13/23 #60 and 0RF   Please Advise

## 2023-03-28 ENCOUNTER — Other Ambulatory Visit: Payer: Self-pay | Admitting: Internal Medicine

## 2023-03-28 DIAGNOSIS — E1142 Type 2 diabetes mellitus with diabetic polyneuropathy: Secondary | ICD-10-CM

## 2023-04-04 ENCOUNTER — Other Ambulatory Visit: Payer: Self-pay | Admitting: Internal Medicine

## 2023-04-04 DIAGNOSIS — E1165 Type 2 diabetes mellitus with hyperglycemia: Secondary | ICD-10-CM

## 2023-04-04 DIAGNOSIS — E1142 Type 2 diabetes mellitus with diabetic polyneuropathy: Secondary | ICD-10-CM

## 2023-04-13 ENCOUNTER — Other Ambulatory Visit: Payer: Self-pay | Admitting: Family Medicine

## 2023-04-13 DIAGNOSIS — K219 Gastro-esophageal reflux disease without esophagitis: Secondary | ICD-10-CM

## 2023-04-13 DIAGNOSIS — I10 Essential (primary) hypertension: Secondary | ICD-10-CM

## 2023-04-18 ENCOUNTER — Encounter: Payer: Self-pay | Admitting: Internal Medicine

## 2023-04-23 NOTE — Telephone Encounter (Signed)
Called and spoke with the patient, and she states that she will be filling out a new application and will mail it back because it was missing a few items including her income.

## 2023-04-26 ENCOUNTER — Encounter: Payer: Self-pay | Admitting: Family Medicine

## 2023-04-26 ENCOUNTER — Ambulatory Visit: Payer: 59 | Admitting: Internal Medicine

## 2023-04-26 ENCOUNTER — Ambulatory Visit: Payer: 59 | Admitting: Family Medicine

## 2023-04-26 VITALS — BP 128/80 | HR 96 | Temp 98.3°F | Resp 18 | Ht 70.0 in | Wt 273.6 lb

## 2023-04-26 DIAGNOSIS — E1169 Type 2 diabetes mellitus with other specified complication: Secondary | ICD-10-CM | POA: Diagnosis not present

## 2023-04-26 DIAGNOSIS — E559 Vitamin D deficiency, unspecified: Secondary | ICD-10-CM | POA: Diagnosis not present

## 2023-04-26 DIAGNOSIS — E1142 Type 2 diabetes mellitus with diabetic polyneuropathy: Secondary | ICD-10-CM

## 2023-04-26 DIAGNOSIS — E1165 Type 2 diabetes mellitus with hyperglycemia: Secondary | ICD-10-CM

## 2023-04-26 DIAGNOSIS — R079 Chest pain, unspecified: Secondary | ICD-10-CM | POA: Diagnosis not present

## 2023-04-26 DIAGNOSIS — I10 Essential (primary) hypertension: Secondary | ICD-10-CM | POA: Diagnosis not present

## 2023-04-26 DIAGNOSIS — E785 Hyperlipidemia, unspecified: Secondary | ICD-10-CM

## 2023-04-26 NOTE — Patient Instructions (Signed)
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
## 2023-04-30 ENCOUNTER — Other Ambulatory Visit: Payer: Self-pay | Admitting: Internal Medicine

## 2023-04-30 DIAGNOSIS — E1142 Type 2 diabetes mellitus with diabetic polyneuropathy: Secondary | ICD-10-CM

## 2023-05-17 ENCOUNTER — Other Ambulatory Visit (INDEPENDENT_AMBULATORY_CARE_PROVIDER_SITE_OTHER): Payer: 59

## 2023-05-17 DIAGNOSIS — I10 Essential (primary) hypertension: Secondary | ICD-10-CM

## 2023-05-17 DIAGNOSIS — E785 Hyperlipidemia, unspecified: Secondary | ICD-10-CM

## 2023-05-17 DIAGNOSIS — E1142 Type 2 diabetes mellitus with diabetic polyneuropathy: Secondary | ICD-10-CM

## 2023-05-17 DIAGNOSIS — E1169 Type 2 diabetes mellitus with other specified complication: Secondary | ICD-10-CM | POA: Diagnosis not present

## 2023-05-17 NOTE — Addendum Note (Signed)
Addended by: Mervin Kung A on: 05/17/2023 02:09 PM   Modules accepted: Orders

## 2023-05-17 NOTE — Addendum Note (Signed)
Addended by: Mervin Kung A on: 05/17/2023 02:12 PM   Modules accepted: Orders

## 2023-05-18 LAB — LIPID PANEL
Cholesterol: 218 mg/dL — ABNORMAL HIGH (ref <200)
HDL: 67 mg/dL (ref >=50)
LDL Cholesterol (Calc): 115 mg/dL (calc) — ABNORMAL HIGH
Non-HDL Cholesterol (Calc): 151 mg/dL (calc) — ABNORMAL HIGH (ref <130)
Total CHOL/HDL Ratio: 3.3 (calc) (ref <5.0)
Triglycerides: 240 mg/dL — ABNORMAL HIGH (ref <150)

## 2023-05-18 LAB — COMPREHENSIVE METABOLIC PANEL
AG Ratio: 1.3 (calc) (ref 1.0–2.5)
ALT: 29 U/L (ref 6–29)
AST: 32 U/L (ref 10–35)
Albumin: 4.1 g/dL (ref 3.6–5.1)
Alkaline phosphatase (APISO): 144 U/L (ref 37–153)
BUN: 12 mg/dL (ref 7–25)
CO2: 26 mmol/L (ref 20–32)
Calcium: 9.5 mg/dL (ref 8.6–10.4)
Chloride: 100 mmol/L (ref 98–110)
Creat: 0.78 mg/dL (ref 0.50–1.05)
Globulin: 3.1 g/dL (calc) (ref 1.9–3.7)
Glucose, Bld: 240 mg/dL — ABNORMAL HIGH (ref 65–99)
Potassium: 4 mmol/L (ref 3.5–5.3)
Sodium: 138 mmol/L (ref 135–146)
Total Bilirubin: 0.7 mg/dL (ref 0.2–1.2)
Total Protein: 7.2 g/dL (ref 6.1–8.1)

## 2023-05-18 LAB — HEMOGLOBIN A1C
Hgb A1c MFr Bld: 8.5 % of total Hgb — ABNORMAL HIGH (ref <5.7)
Mean Plasma Glucose: 197 mg/dL
eAG (mmol/L): 10.9 mmol/L

## 2023-05-18 LAB — CBC WITH DIFFERENTIAL/PLATELET
Absolute Monocytes: 270 cells/uL (ref 200–950)
Basophils Absolute: 29 cells/uL (ref 0–200)
Basophils Relative: 0.8 %
Eosinophils Absolute: 90 cells/uL (ref 15–500)
Eosinophils Relative: 2.5 %
HCT: 42.7 % (ref 35.0–45.0)
Hemoglobin: 13.8 g/dL (ref 11.7–15.5)
Lymphs Abs: 1120 cells/uL (ref 850–3900)
MCH: 27.9 pg (ref 27.0–33.0)
MCHC: 32.3 g/dL (ref 32.0–36.0)
MCV: 86.4 fL (ref 80.0–100.0)
MPV: 11.4 fL (ref 7.5–12.5)
Monocytes Relative: 7.5 %
Neutro Abs: 2092 cells/uL (ref 1500–7800)
Neutrophils Relative %: 58.1 %
Platelets: 126 10*3/uL — ABNORMAL LOW (ref 140–400)
RBC: 4.94 10*6/uL (ref 3.80–5.10)
RDW: 15 % (ref 11.0–15.0)
Total Lymphocyte: 31.1 %
WBC: 3.6 10*3/uL — ABNORMAL LOW (ref 3.8–10.8)

## 2023-05-18 LAB — TSH: TSH: 0.89 mIU/L (ref 0.40–4.50)

## 2023-05-19 ENCOUNTER — Other Ambulatory Visit: Payer: Self-pay | Admitting: Family Medicine

## 2023-05-19 DIAGNOSIS — F419 Anxiety disorder, unspecified: Secondary | ICD-10-CM

## 2023-05-20 NOTE — Telephone Encounter (Signed)
Requesting: lorazepam 0.5mg   Contract: 04/16/2018 UDS: None Last Visit: 04/26/23 Next Visit: None Last Refill: 03/26/23 #60 and 0RF   Please Advise

## 2023-05-21 ENCOUNTER — Encounter: Payer: Self-pay | Admitting: Internal Medicine

## 2023-05-22 NOTE — Telephone Encounter (Signed)
Sending to you if the patient calls back. Please see Dr. Elvera Lennox message.

## 2023-05-22 NOTE — Telephone Encounter (Signed)
Please advise, I was going to offer her a sample but we only have 3 mg tablets or we could do patient assistance but it takes a little while to get the medication here after the application is submitted.

## 2023-05-22 NOTE — Telephone Encounter (Signed)
LMTRC  J.Annina Piotrowski,RMA

## 2023-05-23 ENCOUNTER — Other Ambulatory Visit: Payer: Self-pay | Admitting: Family

## 2023-05-23 DIAGNOSIS — E1142 Type 2 diabetes mellitus with diabetic polyneuropathy: Secondary | ICD-10-CM

## 2023-05-24 ENCOUNTER — Telehealth: Payer: Self-pay

## 2023-05-24 ENCOUNTER — Other Ambulatory Visit: Payer: Self-pay | Admitting: Internal Medicine

## 2023-05-24 MED ORDER — TIRZEPATIDE 2.5 MG/0.5ML ~~LOC~~ SOAJ: 2.5000 mg | SUBCUTANEOUS | 1 refills | Status: DC

## 2023-05-24 NOTE — Telephone Encounter (Signed)
Per patient she has not been taking the Rybelsus due to her Patient Assistance form not completely done. Received another form from company but has not gotten around to completing. Patient willing to try [facility].

## 2023-05-24 NOTE — Telephone Encounter (Signed)
OK, great!  I sent a prescription for Mounjaro 2.5 mg weekly to her pharmacy.  Please have her inject this once a week for the first 2 weeks.  If she can tolerate this well, please ask her to do the remaining 2 injections at the same time.  If she tolerates well the 5 mg dose, please let us know and I will send a prescription for the higher dose, 5 mg weekly, to her pharmacy.

## 2023-05-24 NOTE — Telephone Encounter (Signed)
We do not have other options unfortunately. She is already taking high doses of mealtime insulin. The only thing left is an insulin pump, but the management of this can be quite intense. We can have her come for training with the diabetes educator if she contemplates this.

## 2023-05-24 NOTE — Telephone Encounter (Signed)
Mounjaro sent over by MD, patient notified.

## 2023-05-27 ENCOUNTER — Other Ambulatory Visit: Payer: Self-pay | Admitting: Internal Medicine

## 2023-05-27 DIAGNOSIS — E1142 Type 2 diabetes mellitus with diabetic polyneuropathy: Secondary | ICD-10-CM

## 2023-05-28 NOTE — Telephone Encounter (Signed)
Rybelsus refill request complete

## 2023-06-03 ENCOUNTER — Other Ambulatory Visit: Payer: Self-pay | Admitting: Internal Medicine

## 2023-06-03 NOTE — Telephone Encounter (Signed)
Freestyle Precision Neo test strip refill request complete

## 2023-06-05 ENCOUNTER — Other Ambulatory Visit: Payer: Self-pay | Admitting: Surgery

## 2023-06-05 DIAGNOSIS — R918 Other nonspecific abnormal finding of lung field: Secondary | ICD-10-CM

## 2023-06-07 ENCOUNTER — Encounter: Payer: Self-pay | Admitting: Internal Medicine

## 2023-06-07 ENCOUNTER — Ambulatory Visit: Payer: 59 | Admitting: Internal Medicine

## 2023-06-07 VITALS — BP 156/64 | HR 83 | Ht 70.0 in | Wt 278.0 lb

## 2023-06-07 DIAGNOSIS — E1165 Type 2 diabetes mellitus with hyperglycemia: Secondary | ICD-10-CM

## 2023-06-07 DIAGNOSIS — Z794 Long term (current) use of insulin: Secondary | ICD-10-CM

## 2023-06-07 DIAGNOSIS — E66812 Obesity, class 2: Secondary | ICD-10-CM | POA: Diagnosis not present

## 2023-06-07 DIAGNOSIS — E1142 Type 2 diabetes mellitus with diabetic polyneuropathy: Secondary | ICD-10-CM | POA: Diagnosis not present

## 2023-06-07 DIAGNOSIS — E7849 Other hyperlipidemia: Secondary | ICD-10-CM | POA: Diagnosis not present

## 2023-06-07 DIAGNOSIS — Z7984 Long term (current) use of oral hypoglycemic drugs: Secondary | ICD-10-CM
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
## 2023-06-07 NOTE — Patient Instructions (Addendum)
Please continue: - Metformin 1000 mg 2x a day with meals   Increase: - Novolog 20-30 units 30 min before meals - Tresiba 45-50 units 2x a day  Please restart: - Rybelsus 7 mg in am  Bring the CGM receiver at next visit.  Please return in 3-4 months.

## 2023-06-10 ENCOUNTER — Other Ambulatory Visit: Payer: Self-pay | Admitting: Surgery

## 2023-06-10 DIAGNOSIS — R918 Other nonspecific abnormal finding of lung field: Secondary | ICD-10-CM

## 2023-06-18 ENCOUNTER — Other Ambulatory Visit (HOSPITAL_COMMUNITY): Payer: Self-pay

## 2023-06-18 ENCOUNTER — Telehealth: Payer: Self-pay

## 2023-06-18 NOTE — Telephone Encounter (Signed)
Pharmacy Patient Advocate Encounter   Received notification from CoverMyMeds that prior authorization for [facility] is required/requested.   Insurance verification completed.   The patient is insured through [facility] .   Per test claim: PA required; PA started via CoverMyMeds. KEY [id number] . Waiting for clinical questions to populate.

## 2023-06-19 ENCOUNTER — Ambulatory Visit: Payer: 59 | Admitting: Surgery

## 2023-06-28 ENCOUNTER — Ambulatory Visit (HOSPITAL_COMMUNITY): Payer: 59

## 2023-07-03 ENCOUNTER — Encounter: Payer: Self-pay | Admitting: Internal Medicine

## 2023-07-05 ENCOUNTER — Other Ambulatory Visit: Payer: Self-pay | Admitting: Family Medicine

## 2023-07-05 ENCOUNTER — Other Ambulatory Visit: Payer: Self-pay | Admitting: Internal Medicine

## 2023-07-05 ENCOUNTER — Other Ambulatory Visit: Payer: Self-pay | Admitting: Family

## 2023-07-05 DIAGNOSIS — E1142 Type 2 diabetes mellitus with diabetic polyneuropathy: Secondary | ICD-10-CM

## 2023-07-05 DIAGNOSIS — K219 Gastro-esophageal reflux disease without esophagitis: Secondary | ICD-10-CM

## 2023-07-05 DIAGNOSIS — I10 Essential (primary) hypertension: Secondary | ICD-10-CM

## 2023-07-05 DIAGNOSIS — F419 Anxiety disorder, unspecified: Secondary | ICD-10-CM

## 2023-07-10 ENCOUNTER — Ambulatory Visit: Payer: 59 | Admitting: Surgery

## 2023-07-19 ENCOUNTER — Other Ambulatory Visit: Payer: Self-pay | Admitting: Family Medicine

## 2023-07-19 DIAGNOSIS — F419 Anxiety disorder, unspecified: Secondary | ICD-10-CM

## 2023-07-19 NOTE — Telephone Encounter (Signed)
Prescription Request  07/19/2023  Is this a "Controlled Substance" medicine? No  LOV: 04/26/2023  What is the name of the medication or equipment?   LORazepam (ATIVAN) 0.5 MG tablet  Have you contacted your pharmacy to request a refill? Yes --- sent to [email or web address]   Which pharmacy would you like this sent to?   [facility] [address] [place], [place] - [address] [place] [address] Phone: [phone] Fax: [phone]    Patient notified that their request is being sent to the clinical staff for review and that they should receive a response within 2 business days.   Please advise at Mobile [phone] (mobile)

## 2023-07-19 NOTE — Addendum Note (Signed)
Addended by: Roxanne Gates on: 07/19/2023 01:12 PM   Modules accepted: Orders

## 2023-07-19 NOTE — Telephone Encounter (Signed)
Requesting: Ativan Contract: 03/2018 UDS: 03/2018 Last OV: 04/26/23 Next OV: n/a Last Refill: 05/20/23, #60--0 RF Database:   Please advise

## 2023-07-20 MED ORDER — LORAZEPAM 0.5 MG PO TABS: 0.5000 mg | ORAL_TABLET | Freq: Two times a day (BID) | ORAL | 0 refills | Status: DC | PRN

## 2023-07-27 ENCOUNTER — Other Ambulatory Visit: Payer: Self-pay | Admitting: Internal Medicine

## 2023-07-27 DIAGNOSIS — E1142 Type 2 diabetes mellitus with diabetic polyneuropathy: Secondary | ICD-10-CM

## 2023-08-15 ENCOUNTER — Encounter: Payer: Self-pay | Admitting: Family Medicine

## 2023-08-15 NOTE — Telephone Encounter (Signed)
 Care team updated and letter sent for eye exam notes.

## 2023-08-30 ENCOUNTER — Other Ambulatory Visit: Payer: Self-pay | Admitting: Family Medicine

## 2023-08-30 ENCOUNTER — Other Ambulatory Visit: Payer: Self-pay | Admitting: Internal Medicine

## 2023-08-30 DIAGNOSIS — E1165 Type 2 diabetes mellitus with hyperglycemia: Secondary | ICD-10-CM

## 2023-08-30 DIAGNOSIS — F419 Anxiety disorder, unspecified: Secondary | ICD-10-CM

## 2023-09-02 NOTE — Telephone Encounter (Signed)
 Requesting: Ativan Contract: n/a UDS: n/a Last Visit:04/26/23 Next Visit:n/a Last Refill:07/20/23  Please Advise

## 2023-09-25 ENCOUNTER — Ambulatory Visit (HOSPITAL_BASED_OUTPATIENT_CLINIC_OR_DEPARTMENT_OTHER)
Admission: RE | Admit: 2023-09-25 | Discharge: 2023-09-25 | Disposition: A | Discharge status: Home / Self Care | Payer: No Typology Code available for payment source | Source: Ambulatory Visit | Attending: Medical | Admitting: Medical

## 2023-09-25 ENCOUNTER — Ambulatory Visit: Payer: No Typology Code available for payment source | Admitting: Medical

## 2023-09-25 ENCOUNTER — Telehealth: Payer: Self-pay

## 2023-09-25 ENCOUNTER — Encounter: Payer: Self-pay | Admitting: Medical

## 2023-09-25 VITALS — BP 146/60 | HR 84 | Temp 98.1°F | Resp 20 | Ht 70.0 in | Wt 246.0 lb

## 2023-09-25 DIAGNOSIS — J40 Bronchitis, not specified as acute or chronic: Secondary | ICD-10-CM

## 2023-09-25 DIAGNOSIS — R059 Cough, unspecified: Secondary | ICD-10-CM | POA: Insufficient documentation

## 2023-09-25 DIAGNOSIS — B349 Viral infection, unspecified: Secondary | ICD-10-CM | POA: Diagnosis not present

## 2023-09-25 DIAGNOSIS — J4520 Mild intermittent asthma, uncomplicated: Secondary | ICD-10-CM

## 2023-09-25 LAB — POCT INFLUENZA A/B
Influenza A, POC: NEGATIVE
Influenza B, POC: NEGATIVE

## 2023-09-25 LAB — POC COVID19 BINAXNOW: SARS Coronavirus 2 Ag: NEGATIVE

## 2023-09-25 MED ORDER — DOXYCYCLINE HYCLATE 100 MG PO TABS: 100.0000 mg | ORAL_TABLET | Freq: Two times a day (BID) | ORAL | 0 refills | Status: DC

## 2023-09-25 MED ORDER — ALBUTEROL SULFATE (2.5 MG/3ML) 0.083% IN NEBU: 2.5000 mg | INHALATION_SOLUTION | Freq: Four times a day (QID) | RESPIRATORY_TRACT | 1 refills | Status: AC | PRN

## 2023-09-25 MED ORDER — BENZONATATE 100 MG PO CAPS: 100.0000 mg | ORAL_CAPSULE | Freq: Three times a day (TID) | ORAL | 0 refills | Status: DC | PRN

## 2023-09-25 MED ORDER — BUDESONIDE-FORMOTEROL FUMARATE 160-4.5 MCG/ACT IN AERO: 2.0000 | INHALATION_SPRAY | Freq: Two times a day (BID) | RESPIRATORY_TRACT | 3 refills | Status: DC

## 2023-09-25 NOTE — Telephone Encounter (Signed)
Copied from CRM [id number]. Topic: Clinical - Request for Lab/Test Order >> Sep 25, 2023  8:22 AM Kimberly [name] wrote: Reason for CRM: Patient would like to know if she could have an chest xray before her 1:20 appointment due to her asthma flaring up bad and causing trouble breathing / please call [phone]

## 2023-09-25 NOTE — Patient Instructions (Addendum)
Acute Bronchitis Symptoms of fever, chills, body aches, and productive cough. History of lung tumor with surgery years and COVID-19 infection in the past. Recent exposure to family members with bronchitis. Negative for COVID-19 and flu. -Start Doxycycline 100mg  twice daily for 10 days. -Start Benzonatate for cough. -Start Symbicort inhaler, two inhalations twice daily. -Refill Albuterol solution for nebulizer use as needed. -Order chest x-ray. -Provide work note for absence from 09/22/2023 to 09/29/2023.  General Health Maintenance -Consider flu vaccine in the future.  Follow up 7-10 days or sooner if needed.
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
## 2023-09-26 MED ORDER — FLUTICASONE-SALMETEROL 100-50 MCG/ACT IN AEPB: 1.0000 | INHALATION_SPRAY | Freq: Two times a day (BID) | RESPIRATORY_TRACT | 3 refills | Status: DC

## 2023-09-26 NOTE — Addendum Note (Signed)
Addended by: Gwenevere Abbot on: 09/26/2023 02:24 PM   Modules accepted: Orders

## 2023-10-04 MED ORDER — TERCONAZOLE 80 MG VA SUPP: 80.0000 mg | Freq: Every day | VAGINAL | 0 refills | Status: DC

## 2023-10-08 ENCOUNTER — Other Ambulatory Visit: Payer: Self-pay | Admitting: Family Medicine

## 2023-10-08 DIAGNOSIS — I10 Essential (primary) hypertension: Secondary | ICD-10-CM

## 2023-10-08 DIAGNOSIS — F419 Anxiety disorder, unspecified: Secondary | ICD-10-CM

## 2023-10-09 NOTE — Telephone Encounter (Signed)
Requesting: Ativan 0.5 mg  Contract: N/A UDS: N/A Last Visit: 09/25/2023 Next Visit: N/A Last Refill: 09/02/2023  Please Advise

## 2023-10-11 ENCOUNTER — Encounter: Payer: Self-pay | Admitting: Internal Medicine

## 2023-10-11 ENCOUNTER — Ambulatory Visit: Payer: Medicaid Other | Admitting: Internal Medicine

## 2023-10-11 VITALS — BP 120/68 | HR 88 | Ht 70.0 in | Wt 279.6 lb

## 2023-10-11 DIAGNOSIS — Z794 Long term (current) use of insulin: Secondary | ICD-10-CM

## 2023-10-11 DIAGNOSIS — E1142 Type 2 diabetes mellitus with diabetic polyneuropathy: Secondary | ICD-10-CM | POA: Diagnosis not present

## 2023-10-11 DIAGNOSIS — E1165 Type 2 diabetes mellitus with hyperglycemia: Secondary | ICD-10-CM

## 2023-10-11 DIAGNOSIS — E7849 Other hyperlipidemia: Secondary | ICD-10-CM | POA: Diagnosis not present

## 2023-10-11 DIAGNOSIS — Z7984 Long term (current) use of oral hypoglycemic drugs: Secondary | ICD-10-CM

## 2023-10-11 DIAGNOSIS — E66812 Obesity, class 2: Secondary | ICD-10-CM | POA: Diagnosis not present

## 2023-10-11 LAB — POCT GLYCOSYLATED HEMOGLOBIN (HGB A1C): Hemoglobin A1C: 7.8 % — AB (ref 4.0–5.6)
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
## 2023-10-11 NOTE — Patient Instructions (Addendum)
Please continue: - Metformin 1000 mg 2x a day with meals  - Tresiba 45 units 2x a day  Increase: - Novolog 20-30 units 30 min before meals  Try to look up: - Cequr Simplicity   Or:  - Omnipod 5 - T:slim x2 - mobi - iLet  Please return in 4 months.

## 2023-10-28 ENCOUNTER — Other Ambulatory Visit (HOSPITAL_COMMUNITY): Payer: Self-pay

## 2023-11-05 ENCOUNTER — Other Ambulatory Visit: Payer: Self-pay | Admitting: Family Medicine

## 2023-11-05 DIAGNOSIS — K219 Gastro-esophageal reflux disease without esophagitis: Secondary | ICD-10-CM

## 2023-11-05 DIAGNOSIS — F419 Anxiety disorder, unspecified: Secondary | ICD-10-CM

## 2023-11-06 NOTE — Telephone Encounter (Signed)
 Requesting: Ativan 0.5 mg  Contract: N/A UDS: N/A Last Visit: 09/25/2023 Next Visit: N/A Last Refill: 10/09/2023  Please Advise

## 2023-11-12 ENCOUNTER — Encounter: Payer: Self-pay | Admitting: Family Medicine

## 2023-12-05 ENCOUNTER — Other Ambulatory Visit: Payer: Self-pay | Admitting: Family Medicine

## 2023-12-05 DIAGNOSIS — F419 Anxiety disorder, unspecified: Secondary | ICD-10-CM

## 2023-12-05 NOTE — Telephone Encounter (Signed)
 Requesting: lorazepam 0.5mg   Contract: None UDS: None Last Visit: 04/26/23 Next Visit: None Last Refill: 11/06/23 #60 and 0RF   Please Advise

## 2023-12-11 ENCOUNTER — Telehealth: Payer: Self-pay

## 2023-12-11 ENCOUNTER — Other Ambulatory Visit: Payer: Self-pay | Admitting: Family Medicine

## 2023-12-11 DIAGNOSIS — R5383 Other fatigue: Secondary | ICD-10-CM

## 2023-12-11 NOTE — Telephone Encounter (Signed)
 Copied from CRM [id number]. Topic: Clinical - Request for Lab/Test Order >> Dec 11, 2023 12:01 PM Kimberly [name] wrote: Reason for CRM: Patient requesting labs for Full CBC, Epstein Bar, Vitamin D, and Thyroid due to extreme fatigue. Would like orders placed as soon as possible as she has been on FMLA and today is last day off before going to work.  Callback [phone]

## 2023-12-11 NOTE — Telephone Encounter (Signed)
Orders placed. Pt needs lab appointment please 

## 2023-12-12 NOTE — Telephone Encounter (Signed)
 Left a message on pt's  phone to call the office so we can sch her an lab appt on 12/12/2023 jsi

## 2023-12-20 ENCOUNTER — Ambulatory Visit: Payer: Self-pay

## 2023-12-20 NOTE — Telephone Encounter (Signed)
 Chief Complaint: LBP, flank pain Symptoms: nausea Frequency: Began early AM today Pertinent Negatives: Patient denies fever, hematuria, dysuria Disposition: [] ED /[x] Urgent Care (no appt availability in office) / [x] Appointment(In office/virtual)/ []  [facility]/ [] Home Care/ [] Refused Recommended Disposition /[] [facility]/ []  Follow-up with PCP Additional Notes: Pt reports she awoke early this AM with LBP/flank pain and bladder pressure. Pt denies dysuria, hematuria. No OV available, pt requesting office work her in if possible, pt agrees to UC after work if she is unable to be seen in office today. This RN educated pt on home care, new-worsening symptoms, when to call back/seek emergent care. Pt verbalized understanding and agrees to plan.    Copied from CRM [id number]. Topic: Clinical - Red Word Triage >> Dec 20, 2023 12:11 PM Allyne Areola wrote: Red Word that prompted transfer to Nurse Triage: Patient is calling for a possible bladder infection. She woke up with back pain and pressure. Reason for Disposition  Side (flank) or lower back pain present  Answer Assessment - Initial Assessment Questions 1. SYMPTOM: "What's the main symptom you're concerned about?" (e.g., frequency, incontinence)     LBP, flank pain 2. ONSET: "When did the  symptoms  start?"     This AM 3. PAIN: "Is there any pain?" If Yes, ask: "How bad is it?" (Scale: 1-10; mild, moderate, severe)     None 4. CAUSE: "What do you think is causing the symptoms?"     Possible UTI 5. OTHER SYMPTOMS: "Do you have any other symptoms?" (e.g., blood in urine, fever, flank pain, pain with urination)     Heaviness in bladder, nausea  Protocols used: Urinary Symptoms-A-AH

## 2023-12-23 ENCOUNTER — Other Ambulatory Visit: Payer: Self-pay | Admitting: Surgery

## 2023-12-23 DIAGNOSIS — R918 Other nonspecific abnormal finding of lung field: Secondary | ICD-10-CM

## 2023-12-24 ENCOUNTER — Other Ambulatory Visit: Payer: Self-pay | Admitting: Surgery

## 2023-12-24 DIAGNOSIS — R918 Other nonspecific abnormal finding of lung field: Secondary | ICD-10-CM

## 2023-12-25 ENCOUNTER — Other Ambulatory Visit

## 2023-12-25 ENCOUNTER — Ambulatory Visit: Admitting: Surgery

## 2023-12-26 ENCOUNTER — Ambulatory Visit (INDEPENDENT_AMBULATORY_CARE_PROVIDER_SITE_OTHER): Admitting: Obstetrics and Gynecology

## 2023-12-26 ENCOUNTER — Encounter: Payer: Self-pay | Admitting: Obstetrics and Gynecology

## 2023-12-26 ENCOUNTER — Other Ambulatory Visit (HOSPITAL_COMMUNITY)
Admission: RE | Admit: 2023-12-26 | Discharge: 2023-12-26 | Disposition: A | Discharge status: Home / Self Care | Source: Ambulatory Visit | Attending: Obstetrics and Gynecology | Admitting: Obstetrics and Gynecology

## 2023-12-26 VITALS — BP 118/70 | HR 96 | Ht 69.25 in | Wt 279.0 lb

## 2023-12-26 DIAGNOSIS — Z01419 Encounter for gynecological examination (general) (routine) without abnormal findings: Secondary | ICD-10-CM | POA: Insufficient documentation

## 2023-12-26 DIAGNOSIS — E2839 Other primary ovarian failure: Secondary | ICD-10-CM | POA: Diagnosis not present

## 2023-12-26 DIAGNOSIS — R102 Pelvic and perineal pain: Secondary | ICD-10-CM | POA: Diagnosis not present

## 2023-12-26 DIAGNOSIS — Z1211 Encounter for screening for malignant neoplasm of colon: Secondary | ICD-10-CM

## 2023-12-26 DIAGNOSIS — N898 Other specified noninflammatory disorders of vagina: Secondary | ICD-10-CM

## 2023-12-26 DIAGNOSIS — Z1231 Encounter for screening mammogram for malignant neoplasm of breast: Secondary | ICD-10-CM

## 2023-12-26 DIAGNOSIS — N3281 Overactive bladder: Secondary | ICD-10-CM

## 2023-12-26 MED ORDER — TERCONAZOLE 0.4 % VA CREA: 1.0000 | TOPICAL_CREAM | Freq: Every day | VAGINAL | 5 refills | Status: AC
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
## 2023-12-27 ENCOUNTER — Other Ambulatory Visit

## 2023-12-27 LAB — SURESWAB® ADVANCED VAGINITIS PLUS,TMA
C. trachomatis RNA, TMA: NOT DETECTED
CANDIDA SPECIES: DETECTED — AB
Candida glabrata: DETECTED — AB
N. gonorrhoeae RNA, TMA: NOT DETECTED
SURESWAB(R) ADV BACTERIAL VAGINOSIS(BV),TMA: NEGATIVE
TRICHOMONAS VAGINALIS (TV),TMA: NOT DETECTED

## 2023-12-28 LAB — URINALYSIS, COMPLETE W/RFL CULTURE
Bilirubin Urine: NEGATIVE
Glucose, UA: NEGATIVE
Hgb urine dipstick: NEGATIVE
Hyaline Cast: NONE SEEN /LPF
Nitrites, Initial: NEGATIVE
RBC / HPF: NONE SEEN /HPF (ref 0–2)
Specific Gravity, Urine: 1.025 (ref 1.001–1.035)
pH: 5.5 (ref 5.0–8.0)

## 2023-12-28 LAB — URINE CULTURE
MICRO NUMBER:: 16400621
Result:: NO GROWTH
SPECIMEN QUALITY:: ADEQUATE

## 2023-12-28 LAB — CULTURE INDICATED

## 2023-12-30 ENCOUNTER — Ambulatory Visit (HOSPITAL_COMMUNITY)
Admission: RE | Admit: 2023-12-30 | Discharge: 2023-12-30 | Disposition: A | Discharge status: Home / Self Care | Source: Ambulatory Visit | Attending: Cardiovascular Disease | Admitting: Cardiovascular Disease

## 2023-12-30 DIAGNOSIS — R918 Other nonspecific abnormal finding of lung field: Secondary | ICD-10-CM | POA: Insufficient documentation

## 2023-12-31 ENCOUNTER — Encounter: Payer: Self-pay | Admitting: Obstetrics and Gynecology

## 2023-12-31 DIAGNOSIS — R102 Pelvic and perineal pain: Secondary | ICD-10-CM

## 2023-12-31 DIAGNOSIS — N898 Other specified noninflammatory disorders of vagina: Secondary | ICD-10-CM

## 2023-12-31 NOTE — Telephone Encounter (Signed)
 Dr. Tia Flowers -please review 12/26/23 labs and advise

## 2023-12-31 NOTE — Telephone Encounter (Signed)
 Left message to call [facility] Triage at [phone], option 4. Will also reach back out by MyChart.   MyChart message to patient

## 2024-01-01 ENCOUNTER — Ambulatory Visit: Admitting: Surgery

## 2024-01-01 MED ORDER — SULFAMETHOXAZOLE-TRIMETHOPRIM 800-160 MG PO TABS: 1.0000 | ORAL_TABLET | Freq: Two times a day (BID) | ORAL | 1 refills | Status: AC

## 2024-01-01 NOTE — Telephone Encounter (Signed)
 Rx pended.   Routing to Dr. Tia Flowers to review contraindications.   Drug-Drug: metFORMIN  and sulfamethoxazole -trimethoprimMATE1/2-K Inhibitors (eg, MATE 1/2 K Inhibitors) may increase the serum concentration of metformin .

## 2024-01-01 NOTE — Telephone Encounter (Signed)
 Dr. Tia Flowers, no problems with contraindications that Kimberly [name] you about?

## 2024-01-02 ENCOUNTER — Other Ambulatory Visit: Payer: Self-pay | Admitting: Family Medicine

## 2024-01-02 DIAGNOSIS — I10 Essential (primary) hypertension: Secondary | ICD-10-CM

## 2024-01-03 LAB — CYTOLOGY - PAP
Comment: NEGATIVE
Diagnosis: NEGATIVE
High risk HPV: NEGATIVE

## 2024-01-07 ENCOUNTER — Other Ambulatory Visit: Payer: Self-pay | Admitting: Internal Medicine

## 2024-01-07 DIAGNOSIS — E1142 Type 2 diabetes mellitus with diabetic polyneuropathy: Secondary | ICD-10-CM

## 2024-01-21 ENCOUNTER — Other Ambulatory Visit: Payer: Self-pay | Admitting: Family Medicine

## 2024-01-21 DIAGNOSIS — F419 Anxiety disorder, unspecified: Secondary | ICD-10-CM

## 2024-01-21 MED ORDER — LORAZEPAM 0.5 MG PO TABS: 0.5000 mg | ORAL_TABLET | Freq: Two times a day (BID) | ORAL | 0 refills | Status: DC | PRN

## 2024-01-21 NOTE — Telephone Encounter (Signed)
 Requesting: Ativan  Contract: 2019 UDS: 2019 Last OV: 09/25/2023 Next OV: n/a Last Refill: 12/07/2023, #[id number] RF Database:   Please advise

## 2024-01-21 NOTE — Telephone Encounter (Signed)
Pt overdue for visit. 

## 2024-01-22 ENCOUNTER — Telehealth: Payer: Self-pay

## 2024-01-22 ENCOUNTER — Ambulatory Visit: Admitting: Surgery

## 2024-01-22 NOTE — Telephone Encounter (Signed)
 Copied from CRM [id number]. Topic: Clinical - Request for Lab/Test Order >> Jan 21, 2024  5:31 PM Luane Rumps D wrote: Reason for CRM: Patient would like to request an EBV test order

## 2024-01-23 ENCOUNTER — Ambulatory Visit (INDEPENDENT_AMBULATORY_CARE_PROVIDER_SITE_OTHER)

## 2024-01-23 ENCOUNTER — Other Ambulatory Visit: Payer: Self-pay | Admitting: Obstetrics and Gynecology

## 2024-01-23 DIAGNOSIS — N898 Other specified noninflammatory disorders of vagina: Secondary | ICD-10-CM

## 2024-01-23 DIAGNOSIS — R102 Pelvic and perineal pain: Secondary | ICD-10-CM

## 2024-01-24 NOTE — Telephone Encounter (Signed)
 Pt called. LVM to return call or send Mychart message with below details

## 2024-01-30 ENCOUNTER — Other Ambulatory Visit: Admitting: Obstetrics and Gynecology

## 2024-01-30 ENCOUNTER — Ambulatory Visit: Payer: Self-pay

## 2024-01-30 ENCOUNTER — Encounter: Payer: Self-pay | Admitting: Family Medicine

## 2024-01-30 NOTE — Telephone Encounter (Signed)
 Appt scheduled for tomorrow.

## 2024-01-30 NOTE — Telephone Encounter (Signed)
 FYI Only or Action Required?: Action required by provider  Patient was last seen in primary care on 09/25/2023 by Sylvia Everts, PA-C. Called Nurse Triage reporting Tick Removal. Symptoms began a week ago. Interventions attempted: OTC medications: abx cream. Symptoms are: gradually worsening.  Triage Disposition: See Physician Within 24 Hours   Pt is asking if medication can be called in without appt. Pt is worried to keep missing work with dr appts. Pt sent in pics to provider this morning. Please advise.   Patient/caregiver understands and will follow disposition?: YesCopied from CRM [id number]. Topic: Clinical - Red Word Triage >> Jan 30, 2024  3:38 PM Kimberly [name] wrote: Red Word that prompted transfer to Nurse Triage: tick bite in the bend of knee,welted ,fuss filled Reason for Disposition  [1] Red or very tender (to touch) area AND [2] started over 24 hours after the bite  Answer Assessment - Initial Assessment Questions 1. TYPE of INSECT: "What type of insect was it?"      Not sure  2. ONSET: "When did you get bitten?"     Less than a week ago 3. LOCATION: "Where is the insect bite located?"       Right behind knee 4. REDNESS: "Is the area red or pink?" If Yes, ask: "What size is area of redness?" (inches or cm). "When did the redness start?"     red 5. PAIN: "Is there any pain?" If Yes, ask: "How bad is it?"  (Scale 1-10; or mild, moderate, severe)     If touched 6. ITCHING: "Does it itch?" If Yes, ask: "How bad is the itch?"    - MILD: doesn't interfere with normal activities   - MODERATE-SEVERE: interferes with work, school, sleep, or other activities      Some  7. SWELLING: "How big is the swelling?" (inches, cm, or compare to coins)     Yes-whelp 8. OTHER SYMPTOMS: "Do you have any other symptoms?"  (e.g., difficulty breathing, hives)     Pus on bite; feels sick (chills), sore throat  Protocols used: Insect Bite-A-AH

## 2024-01-31 ENCOUNTER — Ambulatory Visit: Admitting: Medical

## 2024-02-07 ENCOUNTER — Ambulatory Visit (INDEPENDENT_AMBULATORY_CARE_PROVIDER_SITE_OTHER): Admitting: Family Medicine

## 2024-02-07 ENCOUNTER — Encounter: Payer: Self-pay | Admitting: Family Medicine

## 2024-02-07 ENCOUNTER — Other Ambulatory Visit

## 2024-02-07 VITALS — BP 132/82 | HR 99 | Temp 98.0°F | Resp 16 | Ht 69.25 in | Wt 284.0 lb

## 2024-02-07 DIAGNOSIS — R079 Chest pain, unspecified: Secondary | ICD-10-CM

## 2024-02-07 DIAGNOSIS — F419 Anxiety disorder, unspecified: Secondary | ICD-10-CM | POA: Diagnosis not present

## 2024-02-07 DIAGNOSIS — R1011 Right upper quadrant pain: Secondary | ICD-10-CM | POA: Diagnosis not present

## 2024-02-07 DIAGNOSIS — E785 Hyperlipidemia, unspecified: Secondary | ICD-10-CM

## 2024-02-07 DIAGNOSIS — S80861A Insect bite (nonvenomous), right lower leg, initial encounter: Secondary | ICD-10-CM | POA: Insufficient documentation

## 2024-02-07 DIAGNOSIS — E1142 Type 2 diabetes mellitus with diabetic polyneuropathy: Secondary | ICD-10-CM | POA: Diagnosis not present

## 2024-02-07 DIAGNOSIS — E1165 Type 2 diabetes mellitus with hyperglycemia: Secondary | ICD-10-CM

## 2024-02-07 DIAGNOSIS — W57XXXA Bitten or stung by nonvenomous insect and other nonvenomous arthropods, initial encounter: Secondary | ICD-10-CM

## 2024-02-07 DIAGNOSIS — E1169 Type 2 diabetes mellitus with other specified complication: Secondary | ICD-10-CM

## 2024-02-07 DIAGNOSIS — E559 Vitamin D deficiency, unspecified: Secondary | ICD-10-CM

## 2024-02-07 DIAGNOSIS — I10 Essential (primary) hypertension: Secondary | ICD-10-CM

## 2024-02-07 DIAGNOSIS — J4520 Mild intermittent asthma, uncomplicated: Secondary | ICD-10-CM

## 2024-02-07 DIAGNOSIS — N951 Menopausal and female climacteric states: Secondary | ICD-10-CM

## 2024-02-07 MED ORDER — LORAZEPAM 0.5 MG PO TABS
0.5000 mg | ORAL_TABLET | Freq: Two times a day (BID) | ORAL | 0 refills | Status: DC | PRN
Start: 2024-02-07 — End: 2024-03-18

## 2024-02-07 MED ORDER — NYSTATIN 100000 UNIT/GM EX OINT: TOPICAL_OINTMENT | CUTANEOUS | 2 refills | Status: AC

## 2024-02-07 MED ORDER — TRIAMCINOLONE ACETONIDE 0.1 % EX OINT: TOPICAL_OINTMENT | CUTANEOUS | 2 refills | Status: DC

## 2024-02-07 MED ORDER — SUCRALFATE 1 G PO TABS: 1.0000 g | ORAL_TABLET | Freq: Two times a day (BID) | ORAL | 2 refills | Status: AC

## 2024-02-07 NOTE — Patient Instructions (Signed)
Tick Bite Information, Adult  Ticks are insects that can bite. Most ticks live in bushes and grassy areas. They climb onto people and animals that go by. Then they bite. Some ticks carry germs that can make you sick. How can I prevent tick bites? Take these steps: Before you go outside: Wear long sleeves and long pants. Wear light-colored clothes. Tuck your pant legs into your socks. Use an insect repellent that has 20% or higher of the ingredients DEET, picaridin, or IR3535. Follow the instructions on the label. Put it on: Bare skin. Avoid your eyes and mouth areas. The tops of your boots. Your pant legs. The ends of your sleeves. If you use an insect repellent that has the ingredient permethrin, follow the instructions on the label. Do not put permethrin on the skin. Put it on: Clothing. Shoes. Outdoor gear. Tents. When you are outside Avoid walking through long grass. Stay in the middle of the trail. Do not touch the bushes. Check for ticks on your clothes, hair, and skin often while you are outside. Check again before you go inside. When you go indoors Check your clothes for ticks. Dry your clothes in a dryer on high heat for 10 minutes or more. If clothes are damp, more time may be needed. Wash your clothes right away if they need to be washed. Use hot water. Check your pets and outdoor gear. Shower right away. Check your body for ticks. Do a full body check using a mirror. Check your clothes, skin, head, neck, armpits, waist, groin, and joint areas. What is the right way to remove a tick? Remove the tick from your skin as soon as possible. Do not remove the tick with your bare fingers. Do not try to remove a tick with heat, alcohol, petroleum jelly, or fingernail polish. To remove a tick that is crawling on your skin: Go outside and brush the tick off. Use tape or a lint roller. To remove a tick that is biting: Wash your hands. If you have gloves, put them on. Use tweezers,  curved forceps, or a tick-removal tool to grasp the tick. Grasp the tick as close to your skin and as close to the tick's head as possible. Gently pull up until the tick lets go. Try to keep the tick's head attached to its body. Do not twist or jerk the tick. Do not squeeze or crush the tick. What should I do after taking out a tick? Clean the bite area and your hands with soap and water, rubbing alcohol, or an iodine wash. If you have an antiseptic cream or ointment, put a small amount on the bite area. Wash and disinfect any instruments that you used to remove the tick. How should I get rid of a live tick? To get rid of a live tick, use one of these methods: Place the tick in rubbing alcohol. Place it in a bag or container you can close tightly. Throw it away. Wrap it tightly in tape. Throw it away. Flush it down the toilet. Where to find more information Centers for Disease Control and Prevention: cdc.gov/ticks U.S. Environmental Protection Agency: epa.gov/insect-repellents Contact a doctor if: You have a fever or chills. You have a red rash that makes a circle (bull's-eye rash) in the bite area. You have redness and swelling where the tick bit you. You have a headache or stiff neck. You have pain in a muscle, joint, or bone. You are more tired than normal. You have trouble walking or   moving your legs. You have numbness in your legs. You have tender or swollen lymph glands. You have belly (abdominal) pain, vomiting, watery poop (diarrhea), or weight loss. Get help right away if: You cannot remove a tick. You cannot move (have paralysis) or feel weak. You are feeling worse or have new symptoms. You find a tick that is biting you and filled with blood, especially if you are in an area where diseases from ticks are common. Summary Ticks may carry germs that can make you sick. To prevent tick bites wear long sleeves, long pants, and light colors. Use insect repellent. Follow the  instructions on the label. If the tick is biting, remove it right away. Do not try to remove it with heat, alcohol, petroleum jelly, or fingernail polish. Contact a doctor if you have symptoms of a disease after being bitten by a tick. This information is not intended to replace advice given to you by your health care provider. Make sure you discuss any questions you have with your health care provider. Document Revised: 11/13/2021 Document Reviewed: 11/13/2021 Elsevier Patient Education  2024 [employer]  
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
## 2024-02-08 LAB — VITAMIN B12: Vitamin B-12: 1344 pg/mL — ABNORMAL HIGH (ref 200–1100)

## 2024-02-08 LAB — VITAMIN D 25 HYDROXY (VIT D DEFICIENCY, FRACTURES): Vit D, 25-Hydroxy: 34 ng/mL (ref 30–100)

## 2024-02-10 LAB — LYME DISEASE SEROLOGY W/REFLEX: Lyme Total Antibody EIA: NEGATIVE

## 2024-02-11 LAB — ROCKY MTN SPOTTED FVR ABS PNL(IGG+IGM)
RMSF IgG: NOT DETECTED
RMSF IgM: NOT DETECTED

## 2024-02-11 LAB — CBC WITH DIFFERENTIAL/PLATELET
Absolute Lymphocytes: 1110 {cells}/uL (ref 850–3900)
Absolute Monocytes: 226 {cells}/uL (ref 200–950)
Basophils Absolute: 19 {cells}/uL (ref 0–200)
Basophils Relative: 0.6 %
Eosinophils Absolute: 81 {cells}/uL (ref 15–500)
Eosinophils Relative: 2.6 %
HCT: 41.6 % (ref 35.0–45.0)
Hemoglobin: 13.2 g/dL (ref 11.7–15.5)
MCH: 28.3 pg (ref 27.0–33.0)
MCHC: 31.7 g/dL — ABNORMAL LOW (ref 32.0–36.0)
MCV: 89.1 fL (ref 80.0–100.0)
MPV: 11.6 fL (ref 7.5–12.5)
Monocytes Relative: 7.3 %
Neutro Abs: 1665 {cells}/uL (ref 1500–7800)
Neutrophils Relative %: 53.7 %
Platelets: 120 10*3/uL — ABNORMAL LOW (ref 140–400)
RBC: 4.67 10*6/uL (ref 3.80–5.10)
RDW: 14.6 % (ref 11.0–15.0)
Total Lymphocyte: 35.8 %
WBC: 3.1 10*3/uL — ABNORMAL LOW (ref 3.8–10.8)

## 2024-02-11 LAB — LIPID PANEL
Cholesterol: 212 mg/dL — ABNORMAL HIGH (ref <200)
HDL: 66 mg/dL (ref >=50)
LDL Cholesterol (Calc): 118 mg/dL — ABNORMAL HIGH
Non-HDL Cholesterol (Calc): 146 mg/dL — ABNORMAL HIGH (ref <130)
Total CHOL/HDL Ratio: 3.2 (calc) (ref <5.0)
Triglycerides: 164 mg/dL — ABNORMAL HIGH (ref <150)

## 2024-02-11 LAB — COMPREHENSIVE METABOLIC PANEL WITH GFR
AG Ratio: 1.3 (calc) (ref 1.0–2.5)
ALT: 31 U/L — ABNORMAL HIGH (ref 6–29)
AST: 37 U/L — ABNORMAL HIGH (ref 10–35)
Albumin: 4.1 g/dL (ref 3.6–5.1)
Alkaline phosphatase (APISO): 169 U/L — ABNORMAL HIGH (ref 37–153)
BUN: 13 mg/dL (ref 7–25)
CO2: 28 mmol/L (ref 20–32)
Calcium: 9.3 mg/dL (ref 8.6–10.4)
Chloride: 101 mmol/L (ref 98–110)
Creat: 0.71 mg/dL (ref 0.50–1.05)
Globulin: 3.2 g/dL (ref 1.9–3.7)
Glucose, Bld: 120 mg/dL — ABNORMAL HIGH (ref 65–99)
Potassium: 3.9 mmol/L (ref 3.5–5.3)
Sodium: 140 mmol/L (ref 135–146)
Total Bilirubin: 0.8 mg/dL (ref 0.2–1.2)
Total Protein: 7.3 g/dL (ref 6.1–8.1)
eGFR: 95 mL/min/{1.73_m2} (ref >=60)

## 2024-02-11 LAB — TSH: TSH: 0.99 m[IU]/L (ref 0.40–4.50)

## 2024-02-12 ENCOUNTER — Encounter: Payer: Self-pay | Admitting: Obstetrics and Gynecology

## 2024-02-15 ENCOUNTER — Ambulatory Visit: Payer: Self-pay | Admitting: Family Medicine

## 2024-02-20 ENCOUNTER — Other Ambulatory Visit: Payer: Self-pay | Admitting: Internal Medicine

## 2024-02-20 DIAGNOSIS — E1142 Type 2 diabetes mellitus with diabetic polyneuropathy: Secondary | ICD-10-CM

## 2024-03-04 ENCOUNTER — Encounter: Payer: Self-pay | Admitting: Family Medicine

## 2024-03-04 ENCOUNTER — Ambulatory Visit: Admitting: Surgery

## 2024-03-06 NOTE — Telephone Encounter (Signed)
 Copied from CRM [id number]. Topic: General - Other >> Mar 06, 2024  8:50 AM Mia F wrote: Reason for CRM: Pt called asking for the office to disregard the form she send on My Chart regarding FMLA. She states it is outdated an she will be getting a new form to be completed. This is for Dr Antonio.

## 2024-03-09 ENCOUNTER — Encounter: Payer: Self-pay | Admitting: Family Medicine

## 2024-03-18 ENCOUNTER — Other Ambulatory Visit: Payer: Self-pay | Admitting: Family Medicine

## 2024-03-18 DIAGNOSIS — F419 Anxiety disorder, unspecified: Secondary | ICD-10-CM

## 2024-03-18 NOTE — Telephone Encounter (Signed)
 Requesting: lorazepam  0.5mg   Contract: None UDS: None  Last Visit: 02/07/24  Next Visit: None Last Refill: 02/07/24 #60 and 0RF   Please Advise

## 2024-03-19 ENCOUNTER — Other Ambulatory Visit

## 2024-03-26 ENCOUNTER — Ambulatory Visit: Admitting: Internal Medicine
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
## 2024-03-27 ENCOUNTER — Ambulatory Visit (INDEPENDENT_AMBULATORY_CARE_PROVIDER_SITE_OTHER): Admitting: Internal Medicine

## 2024-03-27 ENCOUNTER — Encounter: Payer: Self-pay | Admitting: Internal Medicine

## 2024-03-27 ENCOUNTER — Telehealth: Payer: Self-pay

## 2024-03-27 VITALS — BP 140/80 | HR 96 | Ht 69.25 in | Wt 281.4 lb

## 2024-03-27 DIAGNOSIS — E7849 Other hyperlipidemia: Secondary | ICD-10-CM

## 2024-03-27 DIAGNOSIS — E1165 Type 2 diabetes mellitus with hyperglycemia: Secondary | ICD-10-CM

## 2024-03-27 DIAGNOSIS — Z794 Long term (current) use of insulin: Secondary | ICD-10-CM

## 2024-03-27 DIAGNOSIS — E1142 Type 2 diabetes mellitus with diabetic polyneuropathy: Secondary | ICD-10-CM

## 2024-03-27 DIAGNOSIS — E66813 Obesity, class 3: Secondary | ICD-10-CM | POA: Diagnosis not present

## 2024-03-27 DIAGNOSIS — Z7984 Long term (current) use of oral hypoglycemic drugs: Secondary | ICD-10-CM

## 2024-03-27 LAB — POCT GLYCOSYLATED HEMOGLOBIN (HGB A1C): Hemoglobin A1C: 7.6 % — AB (ref 4.0–5.6)

## 2024-03-27 MED ORDER — FREESTYLE LIBRE 3 READER DEVI: 1.0000 | Freq: Once | 0 refills | Status: AC

## 2024-03-27 MED ORDER — FREESTYLE LIBRE 3 PLUS SENSOR MISC: 1.0000 | Status: DC
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
## 2024-03-27 NOTE — Telephone Encounter (Signed)
 Sample  Device/Supplies: Libre 3 Plus Quantity:1 ONU:[id number]  EXP:08/26/2024   Requested Prescriptions   Signed Prescriptions Disp Refills   Continuous Glucose Sensor (FREESTYLE LIBRE 3 PLUS SENSOR) MISC      Sig: Inject 1 Device into the skin continuous. Change every 15 days [id number] 08/26/2024    Authorizing Provider: TRIXIE FILE    Ordering User: CLEOTILDE ROLIN RAMAN

## 2024-03-27 NOTE — Patient Instructions (Addendum)
 Please continue: - Metformin  1000 mg 2x a day with meals   Change: - Tresiba  45 units in am but 38 units at night - Novolog  22-30 units 30 min before meals  Try to look up: - Cequr Simplicity  - Omnipod 5 - T:slim x2 - mobi - iLet  Please return in 3-4 months.

## 2024-03-28 ENCOUNTER — Ambulatory Visit: Payer: Self-pay | Admitting: Internal Medicine

## 2024-03-28 LAB — MICROALBUMIN / CREATININE URINE RATIO
Creatinine, Urine: 73 mg/dL (ref 20–275)
Microalb Creat Ratio: 14 mg/g{creat} (ref <30)
Microalb, Ur: 1 mg/dL

## 2024-04-02 ENCOUNTER — Other Ambulatory Visit: Payer: Self-pay | Admitting: Family Medicine

## 2024-04-02 DIAGNOSIS — I10 Essential (primary) hypertension: Secondary | ICD-10-CM

## 2024-04-15 ENCOUNTER — Ambulatory Visit: Admitting: Surgery

## 2024-05-09 ENCOUNTER — Other Ambulatory Visit: Payer: Self-pay | Admitting: Family

## 2024-05-09 DIAGNOSIS — F419 Anxiety disorder, unspecified: Secondary | ICD-10-CM

## 2024-05-21 ENCOUNTER — Other Ambulatory Visit: Payer: Self-pay | Admitting: Family Medicine

## 2024-05-21 ENCOUNTER — Ambulatory Visit: Payer: Self-pay

## 2024-05-21 DIAGNOSIS — F419 Anxiety disorder, unspecified: Secondary | ICD-10-CM

## 2024-05-21 MED ORDER — LORAZEPAM 0.5 MG PO TABS: 0.5000 mg | ORAL_TABLET | Freq: Two times a day (BID) | ORAL | 0 refills | Status: DC | PRN

## 2024-05-21 NOTE — Telephone Encounter (Signed)
 This RN attempted to reach pt, LVM requesting return call.  Copied from CRM #[id number]. Topic: Clinical - Red Word Triage >> May 21, 2024  8:56 AM Roselie BROCKS wrote: [facility] that prompted transfer to Nurse Triage: Patient states she is having a severe panic or anxiety attack, I see Larazapan is pending since 9-13, she is out of medication And patient states she wonders if Buspar would help better Patient disconnected during hold for transfer

## 2024-05-21 NOTE — Telephone Encounter (Signed)
 Made 2nd attempt to reach patient, left message with call back number

## 2024-05-21 NOTE — Telephone Encounter (Signed)
 FYI Only or Action Required?: Action required by provider: medication refill request, clinical question for provider, and update on patient condition.  Patient was last seen in primary care on 02/07/2024 by Antonio Meth, Jamee SAUNDERS, DO.  Called Nurse Triage reporting Panic Attack and Medication Problem.  Symptoms began several months ago.  Interventions attempted: Prescription medications: Wellbutrin  and Lorazepam .  Symptoms are: stable.  Triage Disposition: Call PCP When Office is Open  Patient/caregiver understands and will follow disposition?: Yes FYI- Patient says that anxiety and depression run in her family, but one of her relatives has reported significant results since starting Buspar., Pt wants to know if she can be started on a low dose of the medication. Will hold off on scheduling for now since PCP is aware of pts history of anxiety, but patient has been made aware that PCP may require a visit. Pt agreeable if appt is needed.  Also, pt requesting medication refill on 9/13, still has not been refilled. Per chart review it was requesting on 9/17 and 9/19 from the pharmacy to no avail.    Copied from CRM #[id number]. Topic: Clinical - Red Word Triage >> May 21, 2024  8:56 AM Roselie BROCKS wrote: [facility] that prompted transfer to Nurse Triage: Patient states she is having a severe panic or anxiety attack, I see Larazapan is pending since 9-13, she is out of medication And patient states she wonders if Buspar would help better Patient disconnected during hold for transfer Reason for Disposition  Caller wants to use a complementary or alternative medicine  Answer Assessment - Initial Assessment Questions 1. REASON FOR CALL or QUESTION: What is your reason for calling today? or How can I best     Patient wants to know if she can try a low dose of Buspar to see if that will help with overall symptoms of depression and anxiety  2. CALLER: Document the source of call. (e.g., laboratory  staff, caregiver or patient).     Patient  Answer Assessment - Initial Assessment Questions 1. NAME of MEDICINE: What medicine(s) are you calling about?     Buspar  2. QUESTION: What is your question? (e.g., double dose of medicine, side effect)     Patient wants to know is she can be started on this medication.   3. PRESCRIBER: Who prescribed the medicine? Reason: if prescribed by specialist, call should be referred to that group.     PCP  4. SYMPTOMS: Do you have any symptoms? If Yes, ask: What symptoms are you having?  How bad are the symptoms (e.g., mild, moderate, severe)     Pt denies acute panic or anxiety symptoms at this time  5. PREGNANCY:  Is there any chance that you are pregnant? When was your last menstrual period?     no  Protocols used: Medication Question Call-A-AH, PCP Call - No Triage-A-AH

## 2024-05-28 ENCOUNTER — Telehealth: Payer: Self-pay

## 2024-05-28 NOTE — Telephone Encounter (Signed)
 Copied from CRM #[id number]. Topic: Clinical - Red Word Triage >> May 21, 2024  8:56 AM Roselie BROCKS wrote: [facility] that prompted transfer to Nurse Triage: Patient states she is having a severe panic or anxiety attack, I see Larazapan is pending since 9-13, she is out of medication And patient states she wonders if Buspar would help better Patient disconnected during hold for transfer

## 2024-06-01 ENCOUNTER — Encounter: Payer: Self-pay | Admitting: Family Medicine

## 2024-06-01 ENCOUNTER — Other Ambulatory Visit: Payer: Self-pay | Admitting: Family Medicine

## 2024-06-01 DIAGNOSIS — F419 Anxiety disorder, unspecified: Secondary | ICD-10-CM

## 2024-06-01 MED ORDER — BUSPIRONE HCL 5 MG PO TABS: 5.0000 mg | ORAL_TABLET | Freq: Two times a day (BID) | ORAL | 2 refills | Status: AC

## 2024-06-19 ENCOUNTER — Other Ambulatory Visit: Payer: Self-pay | Admitting: Family Medicine

## 2024-07-04 ENCOUNTER — Other Ambulatory Visit: Payer: Self-pay | Admitting: Family Medicine

## 2024-07-04 DIAGNOSIS — I10 Essential (primary) hypertension: Secondary | ICD-10-CM

## 2024-07-04 DIAGNOSIS — F419 Anxiety disorder, unspecified: Secondary | ICD-10-CM

## 2024-07-04 DIAGNOSIS — K219 Gastro-esophageal reflux disease without esophagitis: Secondary | ICD-10-CM

## 2024-07-06 NOTE — Telephone Encounter (Signed)
 Requesting: lorazepam  0.5mg  Contract: None UDS:04/10/2018 Last Visit: 02/07/24 Next Visit: None Last Refill: 05/21/24 #60 and 0RF   Please Advise

## 2024-07-21 ENCOUNTER — Ambulatory Visit: Admitting: Family Medicine

## 2024-07-21 ENCOUNTER — Encounter: Payer: Self-pay | Admitting: Family Medicine

## 2024-07-21 VITALS — BP 112/68 | HR 91 | Temp 98.0°F | Resp 18 | Ht 68.25 in | Wt 280.0 lb

## 2024-07-21 DIAGNOSIS — J4520 Mild intermittent asthma, uncomplicated: Secondary | ICD-10-CM

## 2024-07-21 DIAGNOSIS — E785 Hyperlipidemia, unspecified: Secondary | ICD-10-CM

## 2024-07-21 DIAGNOSIS — I10 Essential (primary) hypertension: Secondary | ICD-10-CM | POA: Diagnosis not present

## 2024-07-21 DIAGNOSIS — E1165 Type 2 diabetes mellitus with hyperglycemia: Secondary | ICD-10-CM

## 2024-07-21 DIAGNOSIS — E559 Vitamin D deficiency, unspecified: Secondary | ICD-10-CM

## 2024-07-21 DIAGNOSIS — R110 Nausea: Secondary | ICD-10-CM

## 2024-07-21 DIAGNOSIS — F419 Anxiety disorder, unspecified: Secondary | ICD-10-CM

## 2024-07-21 DIAGNOSIS — E1142 Type 2 diabetes mellitus with diabetic polyneuropathy: Secondary | ICD-10-CM

## 2024-07-21 DIAGNOSIS — E1169 Type 2 diabetes mellitus with other specified complication: Secondary | ICD-10-CM | POA: Diagnosis not present

## 2024-07-21 MED ORDER — LORAZEPAM 0.5 MG PO TABS: 0.5000 mg | ORAL_TABLET | Freq: Two times a day (BID) | ORAL | 2 refills | Status: AC | PRN

## 2024-07-21 MED ORDER — ONDANSETRON 4 MG PO TBDP: ORAL_TABLET | ORAL | 1 refills | Status: AC
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
## 2024-07-22 LAB — CBC WITH DIFFERENTIAL/PLATELET
Basophils Absolute: 0.1 K/uL (ref 0.0–0.1)
Basophils Relative: 2.1 % (ref 0.0–3.0)
Eosinophils Absolute: 0.1 K/uL (ref 0.0–0.7)
Eosinophils Relative: 2.7 % (ref 0.0–5.0)
HCT: 40.5 % (ref 36.0–46.0)
Hemoglobin: 13.9 g/dL (ref 12.0–15.0)
Lymphocytes Relative: 28.1 % (ref 12.0–46.0)
Lymphs Abs: 1.1 K/uL (ref 0.7–4.0)
MCHC: 34.3 g/dL (ref 30.0–36.0)
MCV: 86 fl (ref 78.0–100.0)
Monocytes Absolute: 0.3 K/uL (ref 0.1–1.0)
Monocytes Relative: 6.5 % (ref 3.0–12.0)
Neutro Abs: 2.3 K/uL (ref 1.4–7.7)
Neutrophils Relative %: 60.6 % (ref 43.0–77.0)
Platelets: 124 K/uL — ABNORMAL LOW (ref 150.0–400.0)
RBC: 4.71 Mil/uL (ref 3.87–5.11)
RDW: 15.2 % (ref 11.5–15.5)
WBC: 3.9 K/uL — ABNORMAL LOW (ref 4.0–10.5)

## 2024-07-22 LAB — COMPREHENSIVE METABOLIC PANEL WITH GFR
ALT: 29 U/L (ref 0–35)
AST: 40 U/L — ABNORMAL HIGH (ref 0–37)
Albumin: 4.2 g/dL (ref 3.5–5.2)
Alkaline Phosphatase: 152 U/L — ABNORMAL HIGH (ref 39–117)
BUN: 18 mg/dL (ref 6–23)
CO2: 28 meq/L (ref 19–32)
Calcium: 9.9 mg/dL (ref 8.4–10.5)
Chloride: 99 meq/L (ref 96–112)
Creatinine, Ser: 0.84 mg/dL (ref 0.40–1.20)
GFR: 73.42 mL/min (ref >60.00)
Glucose, Bld: 202 mg/dL — ABNORMAL HIGH (ref 70–99)
Potassium: 4 meq/L (ref 3.5–5.1)
Sodium: 137 meq/L (ref 135–145)
Total Bilirubin: 0.9 mg/dL (ref 0.2–1.2)
Total Protein: 7.5 g/dL (ref 6.0–8.3)

## 2024-07-22 LAB — LIPID PANEL
Cholesterol: 222 mg/dL — ABNORMAL HIGH (ref 0–200)
HDL: 63.9 mg/dL (ref >39.00)
LDL Cholesterol: 115 mg/dL — ABNORMAL HIGH (ref 0–99)
NonHDL: 158.47
Total CHOL/HDL Ratio: 3
Triglycerides: 217 mg/dL — ABNORMAL HIGH (ref 0.0–149.0)
VLDL: 43.4 mg/dL — ABNORMAL HIGH (ref 0.0–40.0)

## 2024-07-22 LAB — MICROALBUMIN / CREATININE URINE RATIO
Creatinine,U: 87.1 mg/dL
Microalb Creat Ratio: 9.1 mg/g (ref 0.0–30.0)
Microalb, Ur: 0.8 mg/dL (ref 0.0–1.9)

## 2024-07-22 LAB — VITAMIN D 25 HYDROXY (VIT D DEFICIENCY, FRACTURES): VITD: 18.37 ng/mL — ABNORMAL LOW (ref 30.00–100.00)

## 2024-07-22 LAB — HEMOGLOBIN A1C: Hgb A1c MFr Bld: 8.1 % — ABNORMAL HIGH (ref 4.6–6.5)

## 2024-08-05 ENCOUNTER — Other Ambulatory Visit: Payer: Self-pay | Admitting: Family Medicine

## 2024-08-05 ENCOUNTER — Ambulatory Visit: Payer: Self-pay | Admitting: Family Medicine

## 2024-08-05 DIAGNOSIS — E1169 Type 2 diabetes mellitus with other specified complication: Secondary | ICD-10-CM

## 2024-08-05 DIAGNOSIS — E1142 Type 2 diabetes mellitus with diabetic polyneuropathy: Secondary | ICD-10-CM

## 2024-08-07 ENCOUNTER — Ambulatory Visit: Admitting: Internal Medicine

## 2024-08-07 ENCOUNTER — Encounter: Payer: Self-pay | Admitting: Internal Medicine

## 2024-08-07 VITALS — BP 138/74 | HR 84 | Ht 68.25 in | Wt 284.6 lb

## 2024-08-07 DIAGNOSIS — E66813 Obesity, class 3: Secondary | ICD-10-CM | POA: Diagnosis not present

## 2024-08-07 DIAGNOSIS — E1142 Type 2 diabetes mellitus with diabetic polyneuropathy: Secondary | ICD-10-CM | POA: Diagnosis not present

## 2024-08-07 DIAGNOSIS — Z794 Long term (current) use of insulin: Secondary | ICD-10-CM

## 2024-08-07 DIAGNOSIS — Z7984 Long term (current) use of oral hypoglycemic drugs: Secondary | ICD-10-CM

## 2024-08-07 DIAGNOSIS — E1165 Type 2 diabetes mellitus with hyperglycemia: Secondary | ICD-10-CM | POA: Diagnosis not present

## 2024-08-07 DIAGNOSIS — E7849 Other hyperlipidemia: Secondary | ICD-10-CM | POA: Diagnosis not present

## 2024-08-07 MED ORDER — FREESTYLE LIBRE 2 SENSOR MISC: 3 refills | Status: AC

## 2024-08-07 MED ORDER — METFORMIN HCL ER 500 MG PO TB24: ORAL_TABLET | ORAL | 3 refills | Status: AC

## 2024-08-07 NOTE — Telephone Encounter (Signed)
 Copied from CRM #[id number]. Topic: Clinical - Lab/Test Results >> Aug 07, 2024  3:07 PM Dedra B wrote: Reason for CRM: Pt returning call regarding lab results. Relayed message verbatim. Pt verbalized understanding. She would like to try taking [facility] before starting Crestor .

## 2024-08-07 NOTE — Patient Instructions (Addendum)
 Please continue: - Metformin  1000 mg 2x a day with meals  - Tresiba  40 units 2x a day  Increase: - Novolog  22-30 units 30 min before meals  Try to look up: - Cequr Simplicity  - Omnipod 5 - T:slim x2 - [employer]tr[employer]  - mobi - twiist - iLet    Please return in 3-4 months.
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
## 2024-08-12 ENCOUNTER — Other Ambulatory Visit (HOSPITAL_COMMUNITY): Payer: Self-pay

## 2024-08-12 ENCOUNTER — Telehealth: Payer: Self-pay

## 2024-08-12 NOTE — Telephone Encounter (Signed)
 Pharmacy Patient Advocate Encounter   Received notification from Onbase that prior authorization for Tresiba  is required/requested.   Insurance verification completed.   The patient is insured through [employer].   Per test claim:  Rezvoglar  is preferred by the insurance.  If suggested medication is appropriate, Please send in a new RX and discontinue this one. If not, please advise as to why it's not appropriate so that we may request a Prior Authorization. Please note, some preferred medications may still require a PA.  If the suggested medications have not been trialed and there are no contraindications to their use, the PA will not be submitted, as it will not be approved.   Test claim for Rezoglar shows a 30 day copay of $15. Please advise.

## 2024-08-13 MED ORDER — REZVOGLAR KWIKPEN 100 UNIT/ML ~~LOC~~ SOPN: 40.0000 [IU] | PEN_INJECTOR | Freq: Two times a day (BID) | SUBCUTANEOUS | 3 refills | Status: AC

## 2024-08-13 NOTE — Addendum Note (Signed)
 Addended by: CLEOTILDE ROLIN RAMAN on: 08/13/2024 03:36 PM   Modules accepted: Orders

## 2024-08-13 NOTE — Telephone Encounter (Signed)
 OK to use the Rezvoglar  (this is a biosimilar to Lantus , just like Semglee ) at the same doses as with Tresiba , 40 units twice a day.  She may need to adjust the doses a little but can start with the above doses.

## 2024-08-17 ENCOUNTER — Other Ambulatory Visit: Payer: Self-pay | Admitting: Family Medicine

## 2024-08-17 DIAGNOSIS — R079 Chest pain, unspecified: Secondary | ICD-10-CM

## 2024-08-17 DIAGNOSIS — N951 Menopausal and female climacteric states: Secondary | ICD-10-CM

## 2024-08-18 LAB — OPHTHALMOLOGY REPORT-SCANNED

## 2024-08-24 ENCOUNTER — Encounter: Payer: Self-pay | Admitting: Family Medicine

## 2024-09-03 ENCOUNTER — Other Ambulatory Visit: Payer: Self-pay | Admitting: Family Medicine

## 2024-09-12 ENCOUNTER — Other Ambulatory Visit: Payer: Self-pay | Admitting: Internal Medicine

## 2024-09-12 DIAGNOSIS — E1142 Type 2 diabetes mellitus with diabetic polyneuropathy: Secondary | ICD-10-CM

## 2024-12-25 ENCOUNTER — Ambulatory Visit: Admitting: Internal Medicine
